# Patient Record
Sex: Female | Born: 1940 | Race: White | Hispanic: No | Smoking: Former smoker
Health system: Southern US, Community
[De-identification: ages and names within clinical notes are randomized; demographics above are authoritative.]

## PROBLEM LIST (undated history)

## (undated) DIAGNOSIS — M1990 Unspecified osteoarthritis, unspecified site: Secondary | ICD-10-CM

## (undated) DIAGNOSIS — C4490 Unspecified malignant neoplasm of skin, unspecified: Secondary | ICD-10-CM

## (undated) DIAGNOSIS — K219 Gastro-esophageal reflux disease without esophagitis: Secondary | ICD-10-CM

## (undated) DIAGNOSIS — T7840XA Allergy, unspecified, initial encounter: Secondary | ICD-10-CM

## (undated) DIAGNOSIS — M810 Age-related osteoporosis without current pathological fracture: Secondary | ICD-10-CM

## (undated) DIAGNOSIS — I10 Essential (primary) hypertension: Secondary | ICD-10-CM

## (undated) DIAGNOSIS — L570 Actinic keratosis: Secondary | ICD-10-CM

## (undated) DIAGNOSIS — E785 Hyperlipidemia, unspecified: Secondary | ICD-10-CM

## (undated) DIAGNOSIS — Z8619 Personal history of other infectious and parasitic diseases: Secondary | ICD-10-CM

## (undated) DIAGNOSIS — C50919 Malignant neoplasm of unspecified site of unspecified female breast: Secondary | ICD-10-CM

## (undated) DIAGNOSIS — Z923 Personal history of irradiation: Secondary | ICD-10-CM

## (undated) DIAGNOSIS — M8580 Other specified disorders of bone density and structure, unspecified site: Secondary | ICD-10-CM

## (undated) DIAGNOSIS — L439 Lichen planus, unspecified: Secondary | ICD-10-CM

## (undated) HISTORY — DX: Other specified disorders of bone density and structure, unspecified site: M85.80

## (undated) HISTORY — PX: BREAST CYST ASPIRATION: SHX578

## (undated) HISTORY — DX: Lichen planus, unspecified: L43.9

## (undated) HISTORY — DX: Unspecified osteoarthritis, unspecified site: M19.90

## (undated) HISTORY — PX: OOPHORECTOMY: SHX86

## (undated) HISTORY — DX: Age-related osteoporosis without current pathological fracture: M81.0

## (undated) HISTORY — DX: Actinic keratosis: L57.0

## (undated) HISTORY — DX: Malignant neoplasm of unspecified site of unspecified female breast: C50.919

## (undated) HISTORY — DX: Essential (primary) hypertension: I10

## (undated) HISTORY — DX: Allergy, unspecified, initial encounter: T78.40XA

## (undated) HISTORY — DX: Personal history of other infectious and parasitic diseases: Z86.19

## (undated) HISTORY — DX: Unspecified malignant neoplasm of skin, unspecified: C44.90

## (undated) HISTORY — DX: Hyperlipidemia, unspecified: E78.5

## (undated) HISTORY — DX: Gastro-esophageal reflux disease without esophagitis: K21.9

## (undated) HISTORY — PX: OTHER SURGICAL HISTORY: SHX169

## (undated) HISTORY — PX: TUBAL LIGATION: SHX77

---

## 1966-11-14 HISTORY — PX: BREAST SURGERY: SHX581

## 1966-11-14 HISTORY — PX: BREAST EXCISIONAL BIOPSY: SUR124

## 2000-11-14 HISTORY — PX: OTHER SURGICAL HISTORY: SHX169

## 2002-02-07 HISTORY — PX: CARDIOVASCULAR STRESS TEST: SHX262

## 2002-04-22 HISTORY — PX: COLONOSCOPY: SHX174

## 2003-11-15 HISTORY — PX: CHOLECYSTECTOMY: SHX55

## 2004-01-12 HISTORY — PX: OTHER SURGICAL HISTORY: SHX169

## 2006-08-23 ENCOUNTER — Ambulatory Visit: Payer: Self-pay | Admitting: Internal Medicine

## 2007-05-23 ENCOUNTER — Emergency Department: Payer: Self-pay | Admitting: Emergency Medicine

## 2007-05-23 ENCOUNTER — Other Ambulatory Visit: Payer: Self-pay

## 2007-09-28 ENCOUNTER — Ambulatory Visit: Payer: Self-pay | Admitting: Obstetrics and Gynecology

## 2008-07-23 LAB — CONVERTED CEMR LAB
AST: 24 units/L
Albumin: 4.1 g/dL
Alkaline Phosphatase: 80 units/L
CO2: 32.1 meq/L
Chloride: 105 meq/L
Cholesterol: 189 mg/dL
Total CHOL/HDL Ratio: 4.1
Total Protein: 6.8 g/dL
VLDL: 22 mg/dL

## 2008-09-24 ENCOUNTER — Encounter: Payer: Self-pay | Admitting: Family Medicine

## 2008-10-16 ENCOUNTER — Ambulatory Visit: Payer: Self-pay | Admitting: Obstetrics and Gynecology

## 2008-12-05 ENCOUNTER — Encounter: Payer: Self-pay | Admitting: Family Medicine

## 2009-01-01 ENCOUNTER — Encounter: Payer: Self-pay | Admitting: Family Medicine

## 2009-02-19 ENCOUNTER — Encounter: Payer: Self-pay | Admitting: Family Medicine

## 2009-03-30 ENCOUNTER — Encounter: Payer: Self-pay | Admitting: Family Medicine

## 2009-03-30 HISTORY — PX: OTHER SURGICAL HISTORY: SHX169

## 2009-04-27 ENCOUNTER — Encounter: Payer: Self-pay | Admitting: Family Medicine

## 2009-07-28 LAB — CONVERTED CEMR LAB
ALT: 20 units/L
Albumin: 4.1 g/dL
Bilirubin, Direct: 0.1 mg/dL
CO2: 31.7 meq/L
Chloride: 104 meq/L
Cholesterol: 183 mg/dL
Glucose, Bld: 91 mg/dL
Potassium: 4.2 meq/L
Total Protein: 6.7 g/dL
VLDL: 16 mg/dL

## 2009-09-14 LAB — CONVERTED CEMR LAB

## 2009-10-20 ENCOUNTER — Ambulatory Visit: Payer: Self-pay | Admitting: Obstetrics and Gynecology

## 2009-10-20 LAB — HM MAMMOGRAPHY: HM Mammogram: NORMAL

## 2009-10-22 ENCOUNTER — Encounter: Payer: Self-pay | Admitting: Family Medicine

## 2009-11-14 DIAGNOSIS — M1990 Unspecified osteoarthritis, unspecified site: Secondary | ICD-10-CM

## 2009-11-14 HISTORY — DX: Unspecified osteoarthritis, unspecified site: M19.90

## 2010-01-12 HISTORY — PX: OTHER SURGICAL HISTORY: SHX169

## 2010-01-14 LAB — CONVERTED CEMR LAB
ALT: 15 units/L
Albumin: 3.8 g/dL
Anion Gap: 8.9
BUN: 18 mg/dL
Bilirubin, Direct: 0.2 mg/dL
CO2: 30.6 meq/L
Calcium: 9.7 mg/dL
Chloride: 105 meq/L
Cholesterol: 187 mg/dL
Glucose, Bld: 98 mg/dL
Potassium: 4 meq/L
Total Protein: 6.5 g/dL
Triglycerides: 102 mg/dL

## 2010-03-29 DIAGNOSIS — C4492 Squamous cell carcinoma of skin, unspecified: Secondary | ICD-10-CM

## 2010-03-29 HISTORY — DX: Squamous cell carcinoma of skin, unspecified: C44.92

## 2010-04-14 HISTORY — PX: OTHER SURGICAL HISTORY: SHX169

## 2010-04-20 HISTORY — PX: OTHER SURGICAL HISTORY: SHX169

## 2010-05-04 DIAGNOSIS — C4491 Basal cell carcinoma of skin, unspecified: Secondary | ICD-10-CM

## 2010-05-04 HISTORY — DX: Basal cell carcinoma of skin, unspecified: C44.91

## 2010-07-07 ENCOUNTER — Ambulatory Visit: Payer: Self-pay | Admitting: Family Medicine

## 2010-07-07 DIAGNOSIS — I10 Essential (primary) hypertension: Secondary | ICD-10-CM | POA: Insufficient documentation

## 2010-07-07 DIAGNOSIS — M1990 Unspecified osteoarthritis, unspecified site: Secondary | ICD-10-CM | POA: Insufficient documentation

## 2010-07-07 DIAGNOSIS — Z85828 Personal history of other malignant neoplasm of skin: Secondary | ICD-10-CM

## 2010-07-07 DIAGNOSIS — E785 Hyperlipidemia, unspecified: Secondary | ICD-10-CM | POA: Insufficient documentation

## 2010-07-07 DIAGNOSIS — Z9189 Other specified personal risk factors, not elsewhere classified: Secondary | ICD-10-CM | POA: Insufficient documentation

## 2010-08-02 ENCOUNTER — Encounter (INDEPENDENT_AMBULATORY_CARE_PROVIDER_SITE_OTHER): Payer: Self-pay | Admitting: *Deleted

## 2010-08-06 ENCOUNTER — Ambulatory Visit: Payer: Self-pay | Admitting: Family Medicine

## 2010-08-09 ENCOUNTER — Ambulatory Visit: Payer: Self-pay | Admitting: Family Medicine

## 2010-08-09 DIAGNOSIS — R059 Cough, unspecified: Secondary | ICD-10-CM | POA: Insufficient documentation

## 2010-08-09 DIAGNOSIS — R05 Cough: Secondary | ICD-10-CM

## 2010-08-09 LAB — CONVERTED CEMR LAB
Albumin: 4 g/dL (ref 3.5–5.2)
BUN: 18 mg/dL (ref 6–23)
CO2: 29 meq/L (ref 19–32)
Calcium: 9.8 mg/dL (ref 8.4–10.5)
Creatinine, Ser: 0.7 mg/dL (ref 0.4–1.2)
GFR calc non Af Amer: 90.18 mL/min (ref >60)
Glucose, Bld: 91 mg/dL (ref 70–99)
HDL: 43.1 mg/dL (ref >39.00)
Total Protein: 6.5 g/dL (ref 6.0–8.3)
Triglycerides: 132 mg/dL (ref 0.0–149.0)
VLDL: 26.4 mg/dL (ref 0.0–40.0)

## 2010-08-19 ENCOUNTER — Telehealth: Payer: Self-pay | Admitting: Family Medicine

## 2010-11-10 ENCOUNTER — Ambulatory Visit
Admission: RE | Admit: 2010-11-10 | Discharge: 2010-11-10 | Payer: Self-pay | Source: Home / Self Care | Attending: Family Medicine | Admitting: Family Medicine

## 2010-11-10 DIAGNOSIS — J069 Acute upper respiratory infection, unspecified: Secondary | ICD-10-CM | POA: Insufficient documentation

## 2010-11-10 LAB — CONVERTED CEMR LAB: Rapid Strep: NEGATIVE

## 2010-11-11 ENCOUNTER — Ambulatory Visit
Admission: RE | Admit: 2010-11-11 | Discharge: 2010-11-11 | Payer: Self-pay | Source: Home / Self Care | Attending: Family Medicine | Admitting: Family Medicine

## 2010-11-11 ENCOUNTER — Telehealth: Payer: Self-pay | Admitting: Family Medicine

## 2010-11-11 DIAGNOSIS — H1030 Unspecified acute conjunctivitis, unspecified eye: Secondary | ICD-10-CM | POA: Insufficient documentation

## 2010-12-08 ENCOUNTER — Ambulatory Visit: Payer: Self-pay | Admitting: Obstetrics and Gynecology

## 2010-12-14 NOTE — Letter (Signed)
Summary: [facility]  [facility]   Imported By: Lanelle Bal 08/06/2010 09:55:10  _____________________________________________________________________  External Attachment:    Type:   Image     Comment:   External Document

## 2010-12-14 NOTE — Assessment & Plan Note (Signed)
Summary: COUGH X1 MONTH /[facility]   Vital Signs:  Patient profile:   70 year old female Height:      64 inches Weight:      184.25 pounds BMI:     31.74 Temp:     98.2 degrees F oral Pulse rate:   88 / minute Pulse rhythm:   regular BP sitting:   136 / 66  (left arm) Cuff size:   regular  Vitals Entered By: Delilah Shan CMA Ohn Bostic Dull) (August 09, 2010 2:03 PM) CC: Cough x 1 month   History of Present Illness: 1 month of episodic cough w/o wheeze and no sputum production.  No FCNAV.  No cough at night.  Dry cough.  Oc sour taste in mouth.  "metallic taste in mouth."  NO change in smells.  No h/o allergy trouble.  Moved to [place] in 2007.  Sometimes worse after eating.  Voice is "irriated" in AM.  Sleeping on 1 pillow.  Some help with mucinex DM but this was temporary.  On PPI once daily.   H/o GERD and abdominal pain, much improved on PPI.    Allergies: 1)  ! Codeine  Past History:  Past Medical History: CHICKENPOX, HX OF (ICD-V15.9) HYPERTENSION (ICD-401.9) HYPERLIPIDEMIA (ICD-272.4) SKIN CANCER, HX OF (ICD-V10.83) ESSENTIAL HYPERTENSION, BENIGN (ICD-401.1) OSTEOARTHRITIS (ICD-715.90), R hip injection per Dr. Ethelene Hal 2011 GERD 06/23/1998  MRI, neck 02/07/2002  Stress Test 10/31/2003  MRI, neck 01/12/2004   MRI, hip  osteopenia, DXA done 10/2009, consider repeat in 10/2011 or 2013 (T score -1)  Review of Systems       See HPI.  Otherwise negative.    Physical Exam  General:  GEN: nad, alert and oriented HEENT: mucous membranes moist, TM wnl bilaterally, nasal exam w/o abnormality, OP grossly wnl NECK: supple w/o LA CV: rrr.  no murmur PULM: ctab, no inc wob ABD: soft, +bs EXT: no edema SKIN: no acute rash    Impression & Recommendations:  Problem # 1:  COUGH (ICD-786.2) This may be due to GERD with LPR.  Rec: try two times a day PPI and call back in about 10days.  If no relief, consider ENT eval vs treat as presumed nasal source (ie with nasal steroids).  She agrees with  plan.    Complete Medication List: 1)  Naproxen 500 Mg Tabs (Naproxen) .... Take 1 tablet by mouth two times a day 2)  Triamterene-hctz 37.5-25 Mg Tabs (Triamterene-hctz) .... Take 1 tablet by mouth every morning 3)  Loratadine 10 Mg Tabs (Loratadine) .... Take 1 tablet by mouth every morning 4)  Niaspan 1000 Mg Cr-tabs (Niacin (antihyperlipidemic)) .... Take 1 tab by mouth at bedtime 5)  Lipitor 20 Mg Tabs (Atorvastatin calcium) .... Take 1 tab by mouth at bedtime 6)  Carisoprodol 350 Mg Tabs (Carisoprodol) .... Take 1 tab by mouth at bedtime 7)  Tramadol-acetaminophen 37.5-325 Mg Tabs (Tramadol-acetaminophen) .... Take 1 tab by mouth at bedtime 8)  Trazodone Hcl 150 Mg Tabs (Trazodone hcl) .... Take 1.5 tab by mouth at bedtime 9)  Calcium Carbonate-vitamin D 600-400 Mg-unit Tabs (Calcium carbonate-vitamin d) .... Take 1 tablet by mouth two times a day 10)  Multivitamins Tabs (Multiple vitamin) .... Take 1 tablet by mouth once a day 11)  Aspirin 81 Mg Tabs (Aspirin) .... Take 1 tablet by mouth every morning 12)  Glucosamine-chondroitin Caps (Glucosamine-chondroit-vit c-mn) .... Take 1 tablet by mouth two times a day 13)  Garlic Powd (Garlic) .... Take 1 tablet by mouth every morning  14)  Prilosec 20 Mg Cpdr (Omeprazole) .... Take 1 tab by mouth at bedtime  Other Orders: Zoster (Shingles) Vaccine Live [phone]) Admin 1st Vaccine (57846)  Patient Instructions: 1)  Call me back in about 10 days or 2 weeks.  Let me know how you are doing.  I would take prilosec two times a day in the meantime and consider elevating the head of your bed by 3-4 inches.    Current Allergies (reviewed today): ! CODEINE   Immunizations Administered:  Zostavax # 1:    Vaccine Type: Zostavax    Site: Left arm    Mfr: Merck    Dose: 0.5 ml    Route: [place]    Given by: Delilah Shan CMA (AAMA)    Exp. Date: 06/09/2011    Lot #: [id number]    VIS given: 08/26/05 given August 09, 2010.

## 2010-12-14 NOTE — Assessment & Plan Note (Signed)
Summary: NEW PT TO EST/CLE   Vital Signs:  Patient profile:   70 year old female LMP:     11/14/1994 Height:      64 inches Weight:      183.75 pounds BMI:     31.65 Temp:     98.1 degrees F oral Pulse rate:   88 / minute Pulse rhythm:   regular BP sitting:   132 / 70  (left arm) Cuff size:   regular  Vitals Entered By: Delilah Shan CMA Duncan Dull) (July 19, 2010 2:01 PM) CC: New Patient to Establish LMP (date): 11/14/1994     Enter LMP: 11/14/1994 Last PAP Result Done   History of Present Illness: Hypertension:      Using medication without problems or lightheadedness: yes Chest pain with exertion:no Edema:no Short of breath: Other issues: see below  H/o OA in mult joints, prev injected by Dr. Ethelene Hal.  Working on exercise in pool. Occ flare of pain in R 3rd PIP.  No trauma.  Does well on current meds.   Allergies (verified): 1)  ! Codeine  Past History:  Family History: Last updated: 07-19-10 F dead, had little contact with patient, possible CHF M dead, COPD, smoker GF withh CVA at 67  Social History: Last updated: July 19, 2010 College- [employer] grad From [place] plays cards, likes to sew Exercises 5x/week with water aerobics No tob since 1972, rare alcohol  Retired since 2007 and moved to [place] Married 1968 2 kids in [place]  Past Medical History: CHICKENPOX, HX OF (ICD-V15.9) HYPERTENSION (ICD-401.9) HYPERLIPIDEMIA (ICD-272.4) SKIN CANCER, HX OF (ICD-V10.83) ESSENTIAL HYPERTENSION, BENIGN (ICD-401.1) OSTEOARTHRITIS (ICD-715.90) 06/23/1998  MRI, neck 02/07/2002  Stress Test 10/31/2003  MRI, neck 01/12/2004   MRI, hip   Past Surgical History: 1968 Breast Biopsy, fibroadenoma 1972 and 1975  C-Section 2002  Endometrial ablation and ovary removal 2005  Gall Bladder 03/30/09  Right Hip Injection 01/2010   Removal of excess eyelids and shortening of eyelid tendons 04/2010   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  Otherwise negative.    Physical Exam  General:  GEN: nad, alert and oriented HEENT: mucous membranes moist NECK: supple w/o LA CV: rrr.  no murmur PULM: ctab, no inc wob ABD: soft, +bs EXT: no edema SKIN: no acute rash  hands with chronic OA changes but no active synovitis.    Impression & Recommendations:  Problem # 1:  ESSENTIAL HYPERTENSION, BENIGN (ICD-401.1) No change in meds.  Her updated medication list for this problem includes:    Triamterene-hctz 37.5-25 Mg Tabs (Triamterene-hctz) .Marland Kitchen... Take 1 tablet by mouth every morning  Problem # 2:  OSTEOARTHRITIS (ICD-715.90) Also to use topical voltaren gel as needed.  follow up as needed.  Continue exercise. Requesting records.  Her updated medication list for this problem includes:    Naproxen 500 Mg Tabs (Naproxen) .Marland Kitchen... Take 1 tablet by mouth two times a day    Tramadol-acetaminophen 37.5-325 Mg Tabs (Tramadol-acetaminophen) .Marland Kitchen... Take 1 tab by mouth at bedtime  Aspirin 81 Mg Tabs (Aspirin) .Marland Kitchen... Take 1 tablet by mouth every morning  Complete Medication List: 1)  Naproxen 500 Mg Tabs (Naproxen) .... Take 1 tablet by mouth two times a day 2)  Triamterene-hctz 37.5-25 Mg Tabs (Triamterene-hctz) .... Take 1 tablet by mouth every morning 3)  Loratadine 10 Mg Tabs (Loratadine) .... Take 1 tablet by mouth every morning 4)  Niaspan 1000 Mg Cr-tabs (Niacin (antihyperlipidemic)) .... Take 1 tab by mouth at bedtime 5)  Lipitor 20 Mg Tabs (Atorvastatin calcium) .... Take 1 tab by mouth at bedtime 6)  Carisoprodol 350 Mg Tabs  (Carisoprodol) .... Take 1 tab by mouth at bedtime 7)  Tramadol-acetaminophen 37.5-325 Mg Tabs (Tramadol-acetaminophen) .... Take 1 tab by mouth at bedtime 8)  Trazodone Hcl 150 Mg Tabs (Trazodone hcl) .... Take 1.5 tab by mouth at bedtime 9)  Calcium Carbonate-vitamin D 600-400 Mg-unit Tabs (Calcium carbonate-vitamin d) .... Take 1 tablet by mouth two times a day 10)  Multivitamins Tabs (Multiple vitamin) .... Take 1 tablet by mouth once a day 11)  Aspirin 81 Mg Tabs (Aspirin) .... Take 1 tablet by mouth every morning 12)  Glucosamine-chondroitin Caps (Glucosamine-chondroit-vit c-mn) .... Take 1 tablet by mouth two times a day 13)  Garlic Powd (Garlic) .... Take 1 tablet by mouth every morning 14)  Prilosec 20 Mg Cpdr (Omeprazole) .... Take 1 tab by mouth at bedtime  Other Orders: Tdap => 29yrs IM (19147) Admin 1st Vaccine (82956)  Patient Instructions: 1)  Check with your insurance to see if they will cover the shingles shot.  I would get a flu shot this fall.  2)  Please come back for fasting labs in 07/2010. 3)  cmet/lipid 401.1 4)  Please schedule a follow-up appointment as needed.  I would like to see you back in 01/2011.  Prescriptions: TRAZODONE HCL 150 MG TABS (TRAZODONE HCL) Take 1.5 tab by mouth at bedtime  #135 x 3   Entered and Authorized by:   Crawford Givens MD   Signed by:   Crawford Givens MD on 07/07/2010   Method used:   Print then Give to Patient   RxID:   [id number] TRAMADOL-ACETAMINOPHEN 37.5-325 MG TABS (TRAMADOL-ACETAMINOPHEN) Take 1 tab by mouth at bedtime  #90 x 3   Entered and Authorized by:   Crawford Givens MD   Signed by:   Crawford Givens MD on 07/07/2010   Method used:   Print then Give to Patient   RxID:   [id number] CARISOPRODOL 350 MG TABS (CARISOPRODOL) Take 1 tab by mouth at bedtime  #90 x 3   Entered and Authorized by:   Crawford Givens MD   Signed by:   Crawford Givens MD on 07/07/2010   Method used:   Print then Give to Patient   RxID:    [id number] LIPITOR 20 MG TABS (ATORVASTATIN CALCIUM) Take 1 tab by mouth at bedtime  #90 x 3   Entered and Authorized by:   Crawford Givens MD   Signed by:   Crawford Givens MD on 07/07/2010   Method used:   Print then Give to Patient   RxID:   [id number] NIASPAN 1000 MG CR-TABS (NIACIN (ANTIHYPERLIPIDEMIC)) Take 1 tab by mouth at bedtime  #90 x 3   Entered and Authorized by:   Crawford Givens MD   Signed by:   Crawford Givens MD on 07/07/2010   Method used:   Print then Give to Patient   RxID:   [id number] TRIAMTERENE-HCTZ 37.5-25 MG TABS (TRIAMTERENE-HCTZ) Take  1 tablet by mouth every morning  #90 x 3   Entered and Authorized by:   Crawford Givens MD   Signed by:   Crawford Givens MD on 07/07/2010   Method used:   Print then Give to Patient   RxID:   [id number] NAPROXEN 500 MG TABS (NAPROXEN) Take 1 tablet by mouth two times a day  #180 x 3   Entered and Authorized by:   Crawford Givens MD   Signed by:   Crawford Givens MD on 07/07/2010   Method used:   Print then Give to Patient   RxID:   [id number]   Current Allergies (reviewed today): ! CODEINE  Immunizations Administered:  Tetanus Vaccine:    Vaccine Type: Tdap    Site: left deltoid    Mfr: GlaxoSmithKline    Dose: 0.5 ml    Route: IM    Given by: Delilah Shan CMA (AAMA)    Exp. Date: 09/02/2012    Lot #: [id number]    VIS given: 10/02/07 version given July 07, 2010.     Preventive Care Screening  Last Tetanus Booster:    Date:  07/07/2010    Results:  Tdap  Bone Density:    Date:  11/10/1993    Results:  Done std dev  Mammogram:    Date:  10/14/2009    Results:  Done   Pap Smear:    Date:  09/14/2009    Results:  Done   Colonoscopy:    Date:  11/14/2001    Results:  Done     Appended Document: NEW PT TO EST/CLE    Clinical Lists Changes  Observations: Added new observation of PAST SURG HX: 1968 Breast Biopsy, fibroadenoma 1972 and 1975  C-Section 2002  Endometrial  ablation and ovary removal 2005  Gall Bladder 03/30/09  Right Hip Injection 01/2010   Removal of excess eyelids and shortening of eyelid tendons 04/2010   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    Touch Up of Excess Eyelids colonoscopy 2003, repeat due 2013 (07/08/2010 0:45)       Past History:  Past Surgical History: 1968 Breast Biopsy, fibroadenoma 1972 and 1975  C-Section 2002  Endometrial ablation and ovary removal 2005  Gall Bladder 03/30/09  Right Hip Injection 01/2010   Removal of excess eyelids and shortening of eyelid tendons 04/2010   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    Touch Up of Excess Eyelids colonoscopy 2003, repeat due 2013  Appended Document: NEW PT TO EST/CLE    Clinical Lists Changes  Observations: Added new observation of PAST MED HX: CHICKENPOX, HX OF (ICD-V15.9) HYPERTENSION (ICD-401.9) HYPERLIPIDEMIA (ICD-272.4) SKIN CANCER, HX OF (ICD-V10.83) ESSENTIAL HYPERTENSION, BENIGN (ICD-401.1) OSTEOARTHRITIS (ICD-715.90) 06/23/1998  MRI, neck 02/07/2002  Stress Test 10/31/2003  MRI, neck 01/12/2004   MRI, hip  osteopenia, DXA done 10/2009, consider repeat in 10/2011 or 2013 (T score -1) (07/25/2010 17:21)       Past History:  Past Medical History: CHICKENPOX, HX OF (ICD-V15.9) HYPERTENSION (ICD-401.9) HYPERLIPIDEMIA (ICD-272.4) SKIN CANCER, HX OF (ICD-V10.83) ESSENTIAL HYPERTENSION, BENIGN (ICD-401.1) OSTEOARTHRITIS (ICD-715.90) 06/23/1998  MRI, neck 02/07/2002  Stress Test 10/31/2003  MRI, neck 01/12/2004   MRI, hip  osteopenia, DXA done 10/2009, consider repeat in 10/2011 or 2013 (T score -1)

## 2010-12-14 NOTE — Letter (Signed)
Summary: [facility]  [facility]   Imported By: Lanelle Bal 08/06/2010 09:54:23  _____________________________________________________________________  External Attachment:    Type:   Image     Comment:   External Document  Appended Document: [facility]    Clinical Lists Changes  Observations: Added new observation of PAST MED HX: CHICKENPOX, HX OF (ICD-V15.9) HYPERTENSION (ICD-401.9) HYPERLIPIDEMIA (ICD-272.4) SKIN CANCER, HX OF (ICD-V10.83) ESSENTIAL HYPERTENSION, BENIGN (ICD-401.1) OSTEOARTHRITIS (ICD-715.90), R hip injection per Dr. Ethelene Hal 2011 06/23/1998  MRI, neck 02/07/2002  Stress Test 10/31/2003  MRI, neck 01/12/2004   MRI, hip  osteopenia, DXA done 10/2009, consider repeat in 10/2011 or 2013 (T score -1) (08/07/2010 11:46)       Past History:  Past Medical History: CHICKENPOX, HX OF (ICD-V15.9) HYPERTENSION (ICD-401.9) HYPERLIPIDEMIA (ICD-272.4) SKIN CANCER, HX OF (ICD-V10.83) ESSENTIAL HYPERTENSION, BENIGN (ICD-401.1) OSTEOARTHRITIS (ICD-715.90), R hip injection per Dr. Ethelene Hal 2011 06/23/1998  MRI, neck 02/07/2002  Stress Test 10/31/2003  MRI, neck 01/12/2004   MRI, hip  osteopenia, DXA done 10/2009, consider repeat in 10/2011 or 2013 (T score -1)

## 2010-12-14 NOTE — Progress Notes (Signed)
Summary: prilosec is helping   Phone Note Call from Patient Call back at Home Phone [phone]   Caller: Patient Call For: Crawford Givens MD Summary of Call: Patient called to let you know that she has been taking prilosec two times a day and it has really helped. She only has problems right after she eats every now and again.  Initial call taken by: Melody Comas,  August 19, 2010 11:17 AM  Follow-up for Phone Call        Good.  I would continue this for about 1 month and then try to wean back to 1 pill a day.  If she has return of symptoms at that point, go back to 2 pills a day.  follow up as needed.  Follow-up by: Crawford Givens MD,  August 19, 2010 11:27 AM  Additional Follow-up for Phone Call Additional follow up Details #1::        Patient Advised.  Additional Follow-up by: Delilah Shan CMA Duncan Dull),  August 19, 2010 5:11 PM

## 2010-12-14 NOTE — Miscellaneous (Signed)
  Clinical Lists Changes  Observations: Added new observation of MAMMO DUE: 10/2010 (08/02/2010 14:47) Added new observation of MAMMOGRAM: normal (10/20/2009 14:48)      Preventive Care Screening  Mammogram:    Date:  10/20/2009    Next Due:  10/2010    Results:  normal

## 2010-12-14 NOTE — Assessment & Plan Note (Signed)
Summary: FLU SHOT/[facility]  Nurse Visit   Allergies: 1)  ! Codeine  Immunizations Administered:  Influenza Vaccine # 1:    Vaccine Type: Fluvax 3+    Site: left deltoid    Mfr: GlaxoSmithKline    Dose: 0.5 ml    Route: IM    Given by: Lowella Petties CMA    Exp. Date: 05/14/2011    Lot #: [id number]    VIS given: 06/08/10 version given August 06, 2010.  Flu Vaccine Consent Questions:    Do you have a history of severe allergic reactions to this vaccine? no    Any prior history of allergic reactions to egg and/or gelatin? no    Do you have a sensitivity to the preservative Thimersol? no    Do you have a past history of Guillan-Barre Syndrome? no    Do you currently have an acute febrile illness? no    Have you ever had a severe reaction to latex? no    Vaccine information given and explained to patient? yes    Are you currently pregnant? no  Orders Added: 1)  Flu Vaccine 31yrs + [90658] 2)  Admin 1st Vaccine [09811]

## 2010-12-14 NOTE — Letter (Signed)
Summary: [facility]  [facility]   Imported By: Lanelle Bal 08/06/2010 09:46:10  _____________________________________________________________________  External Attachment:    Type:   Image     Comment:   External Document

## 2010-12-14 NOTE — Letter (Signed)
Summary: [facility]  [facility]   Imported By: Lanelle Bal 08/06/2010 09:56:13  _____________________________________________________________________  External Attachment:    Type:   Image     Comment:   External Document

## 2010-12-14 NOTE — Letter (Signed)
Summary: [facility]  [facility]   Imported By: Lanelle Bal 08/06/2010 09:57:37  _____________________________________________________________________  External Attachment:    Type:   Image     Comment:   External Document

## 2010-12-14 NOTE — Op Note (Signed)
Summary: Right Hip Steroid Injection/[facility]  Right Hip Steroid Injection/[facility]   Imported By: Lanelle Bal 08/06/2010 09:51:31  _____________________________________________________________________  External Attachment:    Type:   Image     Comment:   External Document

## 2010-12-14 NOTE — Letter (Signed)
Summary: Records Dated 03-29-10 thru 06-29-10/[facility]  Records Dated 03-29-10 thru 06-29-10/[facility]   Imported By: Lanelle Bal 07/15/2010 12:42:58  _____________________________________________________________________  External Attachment:    Type:   Image     Comment:   External Document

## 2010-12-16 NOTE — Progress Notes (Signed)
Summary: Pink Eye  Phone Note Outgoing Call   Call placed by: Delilah Shan CMA Duncan Dull),  November 11, 2010 12:30 PM Call placed to: Dr. Para March Summary of Call: This patient is on the schedule today for pink eye.  She was just here yesterday afternoon.  Does she need to return again today? Initial call taken by: Delilah Shan CMA Duncan Dull),  November 11, 2010 12:31 PM  Follow-up for Phone Call        I called the patient.  Thick yellow discharge from both eyes, L > R.  No vision change.  Lids are puffy.  No eye pain.  She wanted to come in this afternoon so we'll see her.  Follow-up by: Crawford Givens MD,  November 11, 2010 1:18 PM

## 2010-12-16 NOTE — Assessment & Plan Note (Signed)
Summary: COLD SYMPTOMS   Vital Signs:  Patient profile:   70 year old female Height:      64 inches Weight:      184 pounds BMI:     31.70 Temp:     98.3 degrees F oral Pulse rate:   88 / minute Pulse rhythm:   regular BP sitting:   126 / 70  (left arm) Cuff size:   regular  Vitals Entered By: Delilah Shan CMA Duncan Dull) (November 10, 2010 2:47 PM) CC: Cold symptoms   History of Present Illness: Husband has been sick.  Sx started Saturday night with cough.  Sunday she did fairly well.  Husband has been at ER with PNA and is doing better.  Now with bilateral throat pain  that radiates up to ears.  Still active and up out of bed.  No fevers known.  Dry cough except for minimal sputum in AM.  Sleeping okay in spite of the cough.  +rhinorrhea.    Allergies: 1)  ! Codeine  Review of Systems       See HPI.  Otherwise negative.    Physical Exam  General:  GEN: nad, alert and oriented HEENT: mucous membranes moist, TM w/o erythema, nasal epithelium injected, OP with cobblestoning, sinuses not tender to palpation bilaterally NECK: supple w/o LA CV: rrr. PULM: ctab, no inc wob   Impression & Recommendations:  Problem # 1:  URI (ICD-465.9) RST neg.   I would give this a few days to resolve.  Lungs clear to auscultation bilaterally and nontoxic.  If not improving with symptoms > 1week, I would start the amoxil. Tessalon and supportive tx in meantime.  She agrees.  follow up as needed.  Her updated medication list for this problem includes:    Naproxen 500 Mg Tabs (Naproxen) .Marland Kitchen... Take 1 tablet by mouth two times a day    Loratadine 10 Mg Tabs (Loratadine) .Marland Kitchen... Take 1 tablet by mouth every morning    Aspirin 81 Mg Tabs (Aspirin) .Marland Kitchen... Take 1 tablet by mouth every morning    Tessalon 200 Mg Caps (Benzonatate) .Marland Kitchen... 1 by mouth three times a day for cough  Orders: Rapid Strep (76283) Prescription Created Electronically [phone])  Complete Medication List: 1)  Naproxen 500 Mg Tabs  (Naproxen) .... Take 1 tablet by mouth two times a day 2)  Triamterene-hctz 37.5-25 Mg Tabs (Triamterene-hctz) .... Take 1 tablet by mouth every morning 3)  Loratadine 10 Mg Tabs (Loratadine) .... Take 1 tablet by mouth every morning 4)  Niaspan 1000 Mg Cr-tabs (Niacin (antihyperlipidemic)) .... Take 1 tab by mouth at bedtime 5)  Lipitor 20 Mg Tabs (Atorvastatin calcium) .... Take 1 tab by mouth at bedtime 6)  Carisoprodol 350 Mg Tabs (Carisoprodol) .... Take 1 tab by mouth at bedtime 7)  Tramadol-acetaminophen 37.5-325 Mg Tabs (Tramadol-acetaminophen) .... Take 1 tab by mouth at bedtime 8)  Trazodone Hcl 150 Mg Tabs (Trazodone hcl) .... Take 1.5 tab by mouth at bedtime 9)  Calcium Carbonate-vitamin D 600-400 Mg-unit Tabs (Calcium carbonate-vitamin d) .... Take 1 tablet by mouth two times a day 10)  Multivitamins Tabs (Multiple vitamin) .... Take 1 tablet by mouth once a day 11)  Aspirin 81 Mg Tabs (Aspirin) .... Take 1 tablet by mouth every morning 12)  Glucosamine-chondroitin Caps (Glucosamine-chondroit-vit c-mn) .... Take 1 tablet by mouth two times a day 13)  Garlic Powd (Garlic) .... Take 1 tablet by mouth every morning 14)  Prilosec 20 Mg Cpdr (Omeprazole) .... Take  1 tab by mouth at bedtime 15)  Tessalon 200 Mg Caps (Benzonatate) .Marland Kitchen.. 1 by mouth three times a day for cough 16)  Amoxicillin 875 Mg Tabs (Amoxicillin) .Marland Kitchen.. 1 by mouth two times a day  Patient Instructions: 1)  Start the antibiotics if you aren't improving by early next week.  I would use the tessalon and salt water gargles in the meantime.  Try to get some rest and drink plenty of fluids.  Take care.  Prescriptions: AMOXICILLIN 875 MG TABS (AMOXICILLIN) 1 by mouth two times a day  #20 x 0   Entered and Authorized by:   Crawford Givens MD   Signed by:   Crawford Givens MD on 11/10/2010   Method used:   Print then Give to Patient   RxID:   [id number] TESSALON 200 MG CAPS (BENZONATATE) 1 by mouth three times a day for  cough  #30 x 1   Entered and Authorized by:   Crawford Givens MD   Signed by:   Crawford Givens MD on 11/10/2010   Method used:   Electronically to        [facility]  [address] [id number] (retail)       [address]       [place], [place]  [address]       Ph: [phone] or [phone]       Fax: [phone]   RxID:   [id number]    Orders Added: 1)  Est. Patient Level III [87564] 2)  Rapid Strep [33295] 3)  Prescription Created Electronically [phone]    Current Allergies (reviewed today): ! CODEINE  Laboratory Results  Date/Time Received: November 10, 2010 3:20 PM   Other Tests  Rapid Strep: negative

## 2010-12-16 NOTE — Assessment & Plan Note (Signed)
Summary: pink eye/alc   Vital Signs:  Patient profile:   70 year old female Height:      64 inches Weight:      184 pounds BMI:     31.70 Temp:     98 degrees F oral Pulse rate:   88 / minute Pulse rhythm:   regular BP sitting:   122 / 60  (left arm) Cuff size:   regular  Vitals Entered By: Delilah Shan CMA Duncan Dull) (November 11, 2010 3:01 PM) CC: ? Pink eye   History of Present Illness: Cough and ST are improved today but now has bilateral red eye with yellow discharge.  No vision change.  Eye isn't painful.  No photophobia.  No known FB.  Husband with conjunctivitis.  Allergies: 1)  ! Codeine  Review of Systems       See HPI.  Otherwise negative.    Physical Exam  General:  GEN: nad, alert and oriented HEENT: mucous membranes moist, TM w/o erythema, nasal epithelium injected, OP with cobblestoning- improved, sinuses not tender to palpation bilaterally NECK: supple w/o LA CV: rrr. PULM: ctab, no inc wob eyes with bilateral purulent discharge. PERRL, EOMI and fundi wnl bilateral.  limbus sparing conjunctival injection bilaterally.     Impression & Recommendations:  Problem # 1:  CONJUNCTIVITIS, ACUTE, BILATERAL (ICD-372.00) I would tx given the purulent discharge and the exposure hx.  She agrees.  I think she likely had a viral process with the cough and congestion that is o/w resolving.  Nontoxic.  contact/glasses precautions given.  follow up as needed.  She agrees.  See instructsions.  Orders: Prescription Created Electronically [phone])  Her updated medication list for this problem includes:    Polytrim 10000-0.1 Unit/ml-% Soln (Polymyxin b-trimethoprim) .Marland Kitchen... 1 drop in bilateral eyes every 3 hours during the day (max 6 drops per eye per day) for 7 days  Complete Medication List: 1)  Naproxen 500 Mg Tabs (Naproxen) .... Take 1 tablet by mouth two times a day 2)  Triamterene-hctz 37.5-25 Mg Tabs (Triamterene-hctz) .... Take 1 tablet by mouth every morning 3)   Loratadine 10 Mg Tabs (Loratadine) .... Take 1 tablet by mouth every morning 4)  Niaspan 1000 Mg Cr-tabs (Niacin (antihyperlipidemic)) .... Take 1 tab by mouth at bedtime 5)  Lipitor 20 Mg Tabs (Atorvastatin calcium) .... Take 1 tab by mouth at bedtime 6)  Carisoprodol 350 Mg Tabs (Carisoprodol) .... Take 1 tab by mouth at bedtime 7)  Tramadol-acetaminophen 37.5-325 Mg Tabs (Tramadol-acetaminophen) .... Take 1 tab by mouth at bedtime 8)  Trazodone Hcl 150 Mg Tabs (Trazodone hcl) .... Take 1.5 tab by mouth at bedtime 9)  Calcium Carbonate-vitamin D 600-400 Mg-unit Tabs (Calcium carbonate-vitamin d) .... Take 1 tablet by mouth two times a day 10)  Multivitamins Tabs (Multiple vitamin) .... Take 1 tablet by mouth once a day 11)  Aspirin 81 Mg Tabs (Aspirin) .... Take 1 tablet by mouth every morning 12)  Glucosamine-chondroitin Caps (Glucosamine-chondroit-vit c-mn) .... Take 1 tablet by mouth two times a day 13)  Garlic Powd (Garlic) .... Take 1 tablet by mouth every morning 14)  Prilosec 20 Mg Cpdr (Omeprazole) .... Take 1 tab by mouth at bedtime 15)  Tessalon 200 Mg Caps (Benzonatate) .Marland Kitchen.. 1 by mouth three times a day for cough 16)  Amoxicillin 875 Mg Tabs (Amoxicillin) .Marland Kitchen.. 1 by mouth two times a day 17)  Polytrim 10000-0.1 Unit/ml-% Soln (Polymyxin b-trimethoprim) .Marland Kitchen.. 1 drop in bilateral eyes every 3 hours during  the day (max 6 drops per eye per day) for 7 days  Patient Instructions: 1)  Start the eye drops today and use them for 1 week.  Don't use your old contacts.  Keep using your glasses until your symptoms have resolved for a few days (and after you finish the drops).  Take care.  Prescriptions: POLYTRIM 10000-0.1 UNIT/ML-% SOLN (POLYMYXIN B-TRIMETHOPRIM) 1 drop in bilateral eyes every 3 hours during the day (max 6 drops per eye per day) for 7 days  #47mL x 0   Entered and Authorized by:   Crawford Givens MD   Signed by:   Crawford Givens MD on 11/11/2010   Method used:   [facility] to         [facility]  [address] (retail)       [address]       [place], [place]  [address]       Ph: [phone] or [phone]       Fax: [phone]   RxID:   [id number]    Orders Added: 1)  Prescription Created Electronically [[id number]] 2)  Est. Patient Level III [03474]    Current Allergies (reviewed today): ! CODEINE

## 2011-01-11 ENCOUNTER — Telehealth: Payer: Self-pay | Admitting: Family Medicine

## 2011-01-11 DIAGNOSIS — G4700 Insomnia, unspecified: Secondary | ICD-10-CM | POA: Insufficient documentation

## 2011-01-17 ENCOUNTER — Telehealth: Payer: Self-pay | Admitting: Family Medicine

## 2011-01-20 NOTE — Progress Notes (Signed)
Summary: trazodone  Phone Note Refill Request Message from:  Fax from Pharmacy on January 11, 2011 8:56 AM  Refills Requested: Medication #1:  TRAZODONE HCL 150 MG TABS Take 1.5 tab by mouth at bedtime Re[facility] from [facility]. Form to be filled out is on your desk.   Initial call taken by: Melody Comas,  January 11, 2011 8:57 AM  Follow-up for Phone Call        form signed, please send in.  thanks.  Crawford Givens MD  January 11, 2011 2:53 PM   Faxed.  Delilah Shan CMA (AAMA)  January 11, 2011 3:44 PM   New Problems: INSOMNIA (ICD-780.52)   New Problems: INSOMNIA (ICD-780.52)

## 2011-01-25 ENCOUNTER — Ambulatory Visit (INDEPENDENT_AMBULATORY_CARE_PROVIDER_SITE_OTHER): Payer: Medicare Other | Admitting: Family Medicine

## 2011-01-25 ENCOUNTER — Encounter: Payer: Self-pay | Admitting: Family Medicine

## 2011-01-25 DIAGNOSIS — Z23 Encounter for immunization: Secondary | ICD-10-CM

## 2011-01-25 DIAGNOSIS — E785 Hyperlipidemia, unspecified: Secondary | ICD-10-CM

## 2011-01-25 DIAGNOSIS — R05 Cough: Secondary | ICD-10-CM

## 2011-01-25 DIAGNOSIS — I10 Essential (primary) hypertension: Secondary | ICD-10-CM

## 2011-01-25 NOTE — Progress Notes (Signed)
Summary: ? drug allergy  Phone Note From Pharmacy   Caller: [facility] Summary of Call: Form from [facility] is on your desk, regarding possible drug allergy.                 Lowella Petties CMA, AAMA  January 17, 2011 10:21 AM   Follow-up for Phone Call        please fax back.  done. Crawford Givens MD  January 17, 2011 10:36 AM   Faxed.  Lugene Fuquay CMA (AAMA)  January 17, 2011 11:15 AM

## 2011-01-25 NOTE — Progress Notes (Signed)
Summary: carisoprodol  Phone Note Refill Request Message from:  Fax from Pharmacy on January 17, 2011 10:32 AM  Refills Requested: Medication #1:  CARISOPRODOL 350 MG TABS Take 1 tab by mouth at bedtime Received Form from [facility]  to be filled out and signed, it  is on your desk.   Initial call taken by: Melody Comas,  January 17, 2011 10:34 AM  Follow-up for Phone Call        done, please send in.  thanks. Crawford Givens MD  January 17, 2011 10:37 AM   Faxed.  Lugene Fuquay CMA (AAMA)  January 17, 2011 11:16 AM

## 2011-02-01 NOTE — Assessment & Plan Note (Signed)
Summary: MARCH FOLLOW UP/[facility]   Vital Signs:  Patient profile:   70 year old female Height:      64 inches Weight:      176.50 pounds BMI:     30.41 Temp:     98.3 degrees F oral Pulse rate:   88 / minute Pulse rhythm:   regular BP sitting:   124 / 74  (left arm) Cuff size:   regular  Vitals Entered By: Delilah Shan CMA Sequoyah Counterman Dull) (January 25, 2011 9:53 AM) CC: March follow up   History of Present Illness: Elevated Cholesterol: Using medications without problems:yes Muscle aches: no Other complaints: no Still in water aerobics.   Needs refill on tramadol.  She was taking 2 by mouth at bedtime and the rx was for 1 by mouth at bedtime.  It was sent in as 1 by mouth at bedtime and this needs to be changed.    Hypertension:      Using medication without problems or lightheadedness: yes Chest pain with exertion:no Edema:no Short of breath:no Average home BPs:not checked  Cough- resolved with at bedtime prilosec and doing well.  Tapered down from 40mg  to 20mg .    Allergies: 1)  ! Codeine  Past History:  Past Medical History: Last updated: 08/09/2010 CHICKENPOX, HX OF (ICD-V15.9) HYPERTENSION (ICD-401.9) HYPERLIPIDEMIA (ICD-272.4) SKIN CANCER, HX OF (ICD-V10.83) ESSENTIAL HYPERTENSION, BENIGN (ICD-401.1) OSTEOARTHRITIS (ICD-715.90), R hip injection per Dr. 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  Otherwise negative.    Physical Exam  General:  GEN: nad, alert and oriented HEENT: mucous membranes moist NECK: supple w/o LA CV: regular rate and rhythm  PULM: ctab, no inc wob ABD: soft, +bs EXT: no edema SKIN: no acute rash     Impression & Recommendations:  Problem # 1:  HYPERTENSION (ICD-401.9) NO change in meds.  controlled.  Her updated medication list for this problem includes:    Triamterene-hctz 37.5-25 Mg Tabs (Triamterene-hctz) .Marland Kitchen... Take 1 tablet by mouth every morning  Problem # 2:  HYPERLIPIDEMIA (ICD-272.4) tolerating meds. . no change in meds.  prev labs reviewed.  see plan.  Her updated medication list for this problem includes:    Niaspan 1000 Mg Cr-tabs (Niacin (antihyperlipidemic)) .Marland Kitchen... Take 1 tab by mouth at bedtime    Lipitor 20 Mg Tabs (Atorvastatin calcium) .Marland Kitchen... Take 1 tab by mouth at bedtime  Problem # 3:  COUGH (ICD-786.2) Improved.  She'll try to taper off medicine.   Complete Medication List: 1)  Naproxen 500 Mg Tabs (Naproxen) .... Take 1 tablet by mouth two times a day 2)  Triamterene-hctz 37.5-25 Mg Tabs (Triamterene-hctz) .... Take 1 tablet by mouth every morning 3)  Loratadine 10 Mg Tabs (Loratadine) .... Take 1 tablet by mouth every morning 4)  Niaspan 1000 Mg Cr-tabs (Niacin (antihyperlipidemic)) .... Take 1 tab by mouth at bedtime 5)  Lipitor  20 Mg Tabs (Atorvastatin calcium) .... Take 1 tab by mouth at bedtime 6)  Carisoprodol 350 Mg Tabs (Carisoprodol) .... Take 1 tab by mouth at bedtime 7)  Tramadol-acetaminophen 37.5-325 Mg Tabs (Tramadol-acetaminophen) .... Take 2 tabs by mouth at bedtime 8)  Trazodone Hcl 150 Mg Tabs (Trazodone hcl) .... Take 1.5 tab by mouth at bedtime 9)  Calcium Carbonate-vitamin D 600-400 Mg-unit Tabs (Calcium carbonate-vitamin d) .... Take 1 tablet by mouth two times a day 10)  Multivitamins Tabs (Multiple vitamin) .... Take 1 tablet by mouth once a day 11)  Aspirin 81 Mg Tabs (Aspirin) .... Take 1  tablet by mouth every morning 12)  Glucosamine-chondroitin Caps (Glucosamine-chondroit-vit c-mn) .... Take 1 tablet by mouth two times a day 13)  Garlic Powd (Garlic) .... Take 1 tablet by mouth every morning 14)  Prilosec 20 Mg Cpdr (Omeprazole) .... Take 1 tab by mouth at bedtime 15)  Tessalon 200 Mg Caps (Benzonatate) .Marland Kitchen.. 1 by mouth three times a day for cough 16)  Amoxicillin 875 Mg Tabs (Amoxicillin) .Marland Kitchen.. 1 by mouth two times a day 17)  Polytrim 10000-0.1 Unit/ml-% Soln (Polymyxin b-trimethoprim) .Marland Kitchen.. 1 drop in bilateral eyes every 3 hours during the day (max 6 drops per eye per day) for 7 days 18)  Fish Oil Oil (Fish oil) .... 1,000 mg. every morning  Other Orders: Pneumococcal Vaccine (16109) Admin 1st Vaccine (60454)  Patient Instructions: 1)  Take care.  We'll call about the tramadol.  Let us know if you don't get the back order.  2)  Recheck cmet/lipid before physical in 6 months.   3)  Take care.  Glad to see you.    Orders Added: 1)  Est. Patient Level IV [09811] 2)  Pneumococcal Vaccine [90732] 3)  Admin 1st Vaccine [91478]   Immunizations Administered:  Pneumonia Vaccine:    Vaccine Type: Pneumovax (Medicare)    Site: left deltoid    Mfr: Merck    Dose: 0.5 ml    Route: IM    Given by: Delilah Shan CMA (AAMA)    Exp. Date: 04/07/2012    Lot #: [id number]    VIS given: 10/19/09 version given January 25, 2011.   Immunizations Administered:  Pneumonia Vaccine:    Vaccine Type: Pneumovax (Medicare)    Site: left deltoid    Mfr: Merck    Dose: 0.5 ml    Route: IM    Given by: Delilah Shan CMA (AAMA)    Exp. Date: 04/07/2012    Lot #: [id number]    VIS given: 10/19/09 version given January 25, 2011.  Current Allergies (reviewed today): ! CODEINE  Appended Document: MARCH FOLLOW UP/[facility] Pt's tramadol/APAP rx was corrected via phone by staff Laurette Schimke).  Pt aware.      Clinical Lists Changes  Observations: Added new observation of CHIEF CMPLNT: Preventive Care  (01/26/2011 13:56) Added new observation of PAST MED HX: CHICKENPOX, HX OF (ICD-V15.9) HYPERTENSION (ICD-401.9) HYPERLIPIDEMIA (ICD-272.4) SKIN CANCER, HX OF (ICD-V10.83) ESSENTIAL HYPERTENSION, BENIGN (ICD-401.1) OSTEOARTHRITIS (ICD-715.90), R hip injection per Dr. Ethelene Hal 2011 GERD 06/23/1998  MRI, neck 02/07/2002  Stress Test 10/31/2003  MRI, neck 01/12/2004   MRI, hip  osteopenia, DXA done 10/2009, consider repeat in 10/2011 or 2013 (T score -1)- per [place] Bibles Logan Bores) Pap per gyn Logan Bores) (01/26/2011 13:56)       Past History:  Past Medical History: CHICKENPOX, HX OF (ICD-V15.9) HYPERTENSION (ICD-401.9) HYPERLIPIDEMIA (ICD-272.4) SKIN CANCER, HX OF (ICD-V10.83) ESSENTIAL HYPERTENSION, BENIGN (ICD-401.1) OSTEOARTHRITIS (ICD-715.90), R hip injection per  Dr. Ethelene Hal 2011 GERD 06/23/1998  MRI, neck 02/07/2002  Stress Test 10/31/2003  MRI, neck 01/12/2004   MRI, hip  osteopenia, DXA done 10/2009, consider repeat in 10/2011 or 2013 (T score -1)- per [place] Bibles Logan Bores) Pap per gyn Logan Bores)   Complete Medication List: 1)  Naproxen 500 Mg Tabs (Naproxen) .... Take 1 tablet by mouth two times a day 2)  Triamterene-hctz 37.5-25 Mg Tabs (Triamterene-hctz) .... Take 1 tablet by mouth every morning 3)  Loratadine 10 Mg Tabs (Loratadine) .... Take 1 tablet by mouth every morning 4)  Niaspan 1000 Mg Cr-tabs (Niacin (antihyperlipidemic)) .... Take 1 tab by mouth at bedtime 5)  Lipitor 20 Mg Tabs (Atorvastatin calcium) .... Take 1 tab by mouth at bedtime 6)  Carisoprodol 350 Mg Tabs (Carisoprodol) .... Take 1 tab by mouth at bedtime 7)  Tramadol-acetaminophen 37.5-325 Mg Tabs (Tramadol-acetaminophen) .... Take 2 tabs by mouth at bedtime 8)  Trazodone Hcl 150 Mg Tabs (Trazodone hcl) .... Take 1.5 tab by mouth at bedtime 9)  Calcium Carbonate-vitamin D 600-400 Mg-unit Tabs (Calcium carbonate-vitamin d) .... Take 1 tablet by mouth two times a day 10)  Multivitamins Tabs (Multiple vitamin) .... Take 1 tablet  by mouth once a day 11)  Aspirin 81 Mg Tabs (Aspirin) .... Take 1 tablet by mouth every morning 12)  Glucosamine-chondroitin Caps (Glucosamine-chondroit-vit c-mn) .... Take 1 tablet by mouth two times a day 13)  Garlic Powd (Garlic) .... Take 1 tablet by mouth every morning 14)  Prilosec 20 Mg Cpdr (Omeprazole) .... Take 1 tab by mouth at bedtime 15)  Tessalon 200 Mg Caps (Benzonatate) .Marland Kitchen.. 1 by mouth three times a day for cough 16)  Amoxicillin 875 Mg Tabs (Amoxicillin) .Marland Kitchen.. 1 by mouth two times a day 17)  Polytrim 10000-0.1 Unit/ml-% Soln (Polymyxin b-trimethoprim) .Marland Kitchen.. 1 drop in bilateral eyes every 3 hours during the day (max 6 drops per eye per day) for 7 days 18)  Fish Oil Oil (Fish oil) .... 1,000 mg. every morning  PAP Screening:    Last PAP smear:  09/14/2009  Mammogram Screening:    Last Mammogram:  10/20/2009  Mammogram Results:    Date of Exam:  11/14/2010    Results:  Normal Left, Normal Right  Mammogram Comments:    per patient- normal mammogram in 11/2010 per gyn  Osteoporosis Risk Assessment:  Risk Factors for Fracture or Low Bone Density:   Race (White or Asian):     yes  Immunization & Chemoprophylaxis:    Tetanus vaccine: Tdap  (07/07/2010)    Influenza vaccine: Fluvax 3+  (08/06/2010)    Pneumovax: Pneumovax (Medicare)  (01/25/2011)

## 2011-07-28 ENCOUNTER — Other Ambulatory Visit: Payer: Federal, State, Local not specified - PPO

## 2011-08-02 ENCOUNTER — Other Ambulatory Visit (INDEPENDENT_AMBULATORY_CARE_PROVIDER_SITE_OTHER): Payer: Medicare Other

## 2011-08-02 ENCOUNTER — Encounter: Payer: Federal, State, Local not specified - PPO | Admitting: Family Medicine

## 2011-08-02 DIAGNOSIS — E7800 Pure hypercholesterolemia, unspecified: Secondary | ICD-10-CM

## 2011-08-02 DIAGNOSIS — I10 Essential (primary) hypertension: Secondary | ICD-10-CM

## 2011-08-02 LAB — COMPREHENSIVE METABOLIC PANEL
AST: 22 U/L (ref 0–37)
Albumin: 4 g/dL (ref 3.5–5.2)
BUN: 19 mg/dL (ref 6–23)
Calcium: 10 mg/dL (ref 8.4–10.5)
Chloride: 105 mEq/L (ref 96–112)
Potassium: 4.2 mEq/L (ref 3.5–5.1)

## 2011-08-02 LAB — LDL CHOLESTEROL, DIRECT: Direct LDL: 133.2 mg/dL

## 2011-08-02 LAB — LIPID PANEL: Triglycerides: 101 mg/dL (ref 0.0–149.0)

## 2011-08-04 ENCOUNTER — Encounter: Payer: Self-pay | Admitting: Family Medicine

## 2011-08-10 ENCOUNTER — Ambulatory Visit (INDEPENDENT_AMBULATORY_CARE_PROVIDER_SITE_OTHER): Payer: Medicare Other | Admitting: Family Medicine

## 2011-08-10 ENCOUNTER — Encounter: Payer: Self-pay | Admitting: Family Medicine

## 2011-08-10 VITALS — BP 138/64 | HR 88 | Temp 97.8°F | Wt 171.5 lb

## 2011-08-10 DIAGNOSIS — G4700 Insomnia, unspecified: Secondary | ICD-10-CM

## 2011-08-10 DIAGNOSIS — Z1211 Encounter for screening for malignant neoplasm of colon: Secondary | ICD-10-CM

## 2011-08-10 DIAGNOSIS — J302 Other seasonal allergic rhinitis: Secondary | ICD-10-CM

## 2011-08-10 DIAGNOSIS — Z7189 Other specified counseling: Secondary | ICD-10-CM

## 2011-08-10 DIAGNOSIS — J309 Allergic rhinitis, unspecified: Secondary | ICD-10-CM

## 2011-08-10 DIAGNOSIS — K219 Gastro-esophageal reflux disease without esophagitis: Secondary | ICD-10-CM

## 2011-08-10 DIAGNOSIS — I10 Essential (primary) hypertension: Secondary | ICD-10-CM

## 2011-08-10 DIAGNOSIS — M8580 Other specified disorders of bone density and structure, unspecified site: Secondary | ICD-10-CM

## 2011-08-10 DIAGNOSIS — E785 Hyperlipidemia, unspecified: Secondary | ICD-10-CM

## 2011-08-10 DIAGNOSIS — M1990 Unspecified osteoarthritis, unspecified site: Secondary | ICD-10-CM

## 2011-08-10 DIAGNOSIS — Z23 Encounter for immunization: Secondary | ICD-10-CM

## 2011-08-10 DIAGNOSIS — M899 Disorder of bone, unspecified: Secondary | ICD-10-CM

## 2011-08-10 MED ORDER — NIACIN ER (ANTIHYPERLIPIDEMIC) 1000 MG PO TBCR: 1000.0000 mg | EXTENDED_RELEASE_TABLET | Freq: Every day | ORAL | Status: DC

## 2011-08-10 MED ORDER — TRIAMTERENE-HCTZ 37.5-25 MG PO CAPS: 1.0000 | ORAL_CAPSULE | ORAL | Status: DC

## 2011-08-10 MED ORDER — TRAMADOL-ACETAMINOPHEN 37.5-325 MG PO TABS: 2.0000 | ORAL_TABLET | Freq: Every day | ORAL | Status: DC

## 2011-08-10 MED ORDER — MOMETASONE FUROATE 0.1 % EX SOLN: Freq: Every day | CUTANEOUS | Status: DC

## 2011-08-10 MED ORDER — TRAZODONE HCL 150 MG PO TABS: 225.0000 mg | ORAL_TABLET | Freq: Every day | ORAL | Status: DC

## 2011-08-10 MED ORDER — CARISOPRODOL 350 MG PO TABS: 350.0000 mg | ORAL_TABLET | Freq: Every day | ORAL | Status: DC

## 2011-08-10 MED ORDER — ATORVASTATIN CALCIUM 20 MG PO TABS: 20.0000 mg | ORAL_TABLET | Freq: Every day | ORAL | Status: DC

## 2011-08-10 MED ORDER — NAPROXEN 500 MG PO TABS: 500.0000 mg | ORAL_TABLET | Freq: Two times a day (BID) | ORAL | Status: DC

## 2011-08-10 NOTE — Patient Instructions (Signed)
Keep exercising and working on your diet.  Take care.  Talk to Dr. Logan Bores about your mammogram and bone scan.  Glad to see you.

## 2011-08-10 NOTE — Progress Notes (Signed)
Itchy ear canals, worse with seasonal allergies.  Had used topical mometasone with relief.  Needs refill.  No ADE.   Heartburn/lower abd pain, needed to go back on PPI daily.  She had tried taking it qod, but this didn't last.  Worked on diet w/o sig change (occ changed with spicy food, but this isn't consistent).  No vomiting, no blood in stool.  No fevers.    Insomnia, variable sx, but some relief with the medicine.  Good sleep hygiene, not using caffeine late in the day.  Has a comfortable bed and dark room.  Uses sleep mask.    OA.  Using meds with relief.  No redness in MCPs per patient.  No recent flares.  Tolerating meds.  Using water exercises.    Hypertension:    Using medication without problems or lightheadedness: yes Chest pain with exertion:no Edema:no Short of breath:no Average home BPs: not checked  Elevated Cholesterol: Using medications without problems: yes Muscle aches: no Diet compliance:yes Exercise:yes Other complaints:no  Dxa and mammogram per gyn.    Meds, vitals, and allergies reviewed.   PMH and SH reviewed  ROS: See HPI.  Otherwise negative.    GEN: nad, alert and oriented HEENT: mucous membranes moist, no active irritation in canals and TMs wnl NECK: supple w/o LA CV: rrr. PULM: ctab, no inc wob ABD: soft, +bs EXT: no edema SKIN: no acute rash

## 2011-08-11 ENCOUNTER — Encounter: Payer: Self-pay | Admitting: Family Medicine

## 2011-08-11 DIAGNOSIS — Z7189 Other specified counseling: Secondary | ICD-10-CM | POA: Insufficient documentation

## 2011-08-11 DIAGNOSIS — K219 Gastro-esophageal reflux disease without esophagitis: Secondary | ICD-10-CM | POA: Insufficient documentation

## 2011-08-11 DIAGNOSIS — M810 Age-related osteoporosis without current pathological fracture: Secondary | ICD-10-CM | POA: Insufficient documentation

## 2011-08-11 DIAGNOSIS — Z1211 Encounter for screening for malignant neoplasm of colon: Secondary | ICD-10-CM | POA: Insufficient documentation

## 2011-08-11 DIAGNOSIS — J302 Other seasonal allergic rhinitis: Secondary | ICD-10-CM | POA: Insufficient documentation

## 2011-08-11 DIAGNOSIS — M8580 Other specified disorders of bone density and structure, unspecified site: Secondary | ICD-10-CM | POA: Insufficient documentation

## 2011-08-11 NOTE — Assessment & Plan Note (Signed)
Not due for repeat colonoscopy yet.

## 2011-08-11 NOTE — Assessment & Plan Note (Signed)
Stable, no change in meds.  Continue exercise, d/w pt about diet.

## 2011-08-11 NOTE — Assessment & Plan Note (Signed)
Sx controlled, no change in meds.  D/w pt about sleep hygiene.

## 2011-08-11 NOTE — Assessment & Plan Note (Signed)
Continue PPI in light of sx with continued NSAID tx.

## 2011-08-11 NOTE — Assessment & Plan Note (Signed)
Sx controlled, no change in meds.

## 2011-08-11 NOTE — Assessment & Plan Note (Signed)
With sensitivity of the ear canal.  Normal exam today, but needs refill, this was done.  F/u prn.

## 2011-08-11 NOTE — Assessment & Plan Note (Signed)
BP controlled, no change in meds.  Continue exercise, d/w pt about diet.

## 2011-08-11 NOTE — Assessment & Plan Note (Signed)
She'll check into her AD preparation.

## 2011-11-15 HISTORY — PX: BREAST BIOPSY: SHX20

## 2011-11-24 DIAGNOSIS — N63 Unspecified lump in unspecified breast: Secondary | ICD-10-CM | POA: Diagnosis not present

## 2011-12-12 ENCOUNTER — Ambulatory Visit: Payer: Self-pay | Admitting: Obstetrics and Gynecology

## 2011-12-12 DIAGNOSIS — N6459 Other signs and symptoms in breast: Secondary | ICD-10-CM | POA: Diagnosis not present

## 2011-12-12 DIAGNOSIS — R928 Other abnormal and inconclusive findings on diagnostic imaging of breast: Secondary | ICD-10-CM | POA: Diagnosis not present

## 2011-12-12 DIAGNOSIS — N63 Unspecified lump in unspecified breast: Secondary | ICD-10-CM | POA: Diagnosis not present

## 2011-12-26 DIAGNOSIS — N63 Unspecified lump in unspecified breast: Secondary | ICD-10-CM | POA: Diagnosis not present

## 2011-12-26 DIAGNOSIS — L570 Actinic keratosis: Secondary | ICD-10-CM | POA: Diagnosis not present

## 2011-12-26 DIAGNOSIS — L723 Sebaceous cyst: Secondary | ICD-10-CM | POA: Diagnosis not present

## 2011-12-26 DIAGNOSIS — L820 Inflamed seborrheic keratosis: Secondary | ICD-10-CM | POA: Diagnosis not present

## 2011-12-26 DIAGNOSIS — Z85828 Personal history of other malignant neoplasm of skin: Secondary | ICD-10-CM | POA: Diagnosis not present

## 2012-01-09 DIAGNOSIS — N63 Unspecified lump in unspecified breast: Secondary | ICD-10-CM | POA: Diagnosis not present

## 2012-01-10 DIAGNOSIS — N6009 Solitary cyst of unspecified breast: Secondary | ICD-10-CM | POA: Diagnosis not present

## 2012-01-16 ENCOUNTER — Encounter: Payer: Self-pay | Admitting: Family Medicine

## 2012-01-16 DIAGNOSIS — N6009 Solitary cyst of unspecified breast: Secondary | ICD-10-CM | POA: Insufficient documentation

## 2012-01-26 ENCOUNTER — Other Ambulatory Visit: Payer: Self-pay | Admitting: *Deleted

## 2012-01-27 ENCOUNTER — Other Ambulatory Visit: Payer: Self-pay | Admitting: *Deleted

## 2012-01-27 MED ORDER — CARISOPRODOL 350 MG PO TABS: 350.0000 mg | ORAL_TABLET | Freq: Every day | ORAL | Status: DC

## 2012-01-27 NOTE — Telephone Encounter (Signed)
Faxed to [facility] [phone]

## 2012-01-27 NOTE — Telephone Encounter (Signed)
Printed, please fax in.  

## 2012-04-02 DIAGNOSIS — L578 Other skin changes due to chronic exposure to nonionizing radiation: Secondary | ICD-10-CM | POA: Diagnosis not present

## 2012-04-02 DIAGNOSIS — L570 Actinic keratosis: Secondary | ICD-10-CM | POA: Diagnosis not present

## 2012-07-12 ENCOUNTER — Ambulatory Visit: Payer: Self-pay | Admitting: General Surgery

## 2012-07-12 DIAGNOSIS — N63 Unspecified lump in unspecified breast: Secondary | ICD-10-CM | POA: Diagnosis not present

## 2012-07-12 DIAGNOSIS — R928 Other abnormal and inconclusive findings on diagnostic imaging of breast: Secondary | ICD-10-CM | POA: Diagnosis not present

## 2012-07-19 DIAGNOSIS — Z1211 Encounter for screening for malignant neoplasm of colon: Secondary | ICD-10-CM | POA: Diagnosis not present

## 2012-07-19 DIAGNOSIS — N6019 Diffuse cystic mastopathy of unspecified breast: Secondary | ICD-10-CM | POA: Diagnosis not present

## 2012-07-24 ENCOUNTER — Other Ambulatory Visit: Payer: Self-pay | Admitting: Family Medicine

## 2012-07-24 DIAGNOSIS — E7800 Pure hypercholesterolemia, unspecified: Secondary | ICD-10-CM

## 2012-07-24 DIAGNOSIS — M8580 Other specified disorders of bone density and structure, unspecified site: Secondary | ICD-10-CM

## 2012-07-31 ENCOUNTER — Other Ambulatory Visit (INDEPENDENT_AMBULATORY_CARE_PROVIDER_SITE_OTHER): Payer: Medicare Other

## 2012-07-31 DIAGNOSIS — E7800 Pure hypercholesterolemia, unspecified: Secondary | ICD-10-CM | POA: Diagnosis not present

## 2012-07-31 DIAGNOSIS — M8580 Other specified disorders of bone density and structure, unspecified site: Secondary | ICD-10-CM

## 2012-07-31 DIAGNOSIS — M949 Disorder of cartilage, unspecified: Secondary | ICD-10-CM | POA: Diagnosis not present

## 2012-07-31 LAB — COMPREHENSIVE METABOLIC PANEL
BUN: 18 mg/dL (ref 6–23)
CO2: 27 mEq/L (ref 19–32)
Creatinine, Ser: 0.9 mg/dL (ref 0.4–1.2)
GFR: 68.6 mL/min (ref >60.00)
Glucose, Bld: 90 mg/dL (ref 70–99)
Sodium: 139 mEq/L (ref 135–145)
Total Bilirubin: 0.7 mg/dL (ref 0.3–1.2)
Total Protein: 6.7 g/dL (ref 6.0–8.3)

## 2012-07-31 LAB — LIPID PANEL
Cholesterol: 174 mg/dL (ref 0–200)
HDL: 43 mg/dL (ref >39.00)
Triglycerides: 169 mg/dL — ABNORMAL HIGH (ref 0.0–149.0)

## 2012-08-01 LAB — VITAMIN D 25 HYDROXY (VIT D DEFICIENCY, FRACTURES): Vit D, 25-Hydroxy: 44 ng/mL (ref 30–89)

## 2012-08-10 ENCOUNTER — Ambulatory Visit (INDEPENDENT_AMBULATORY_CARE_PROVIDER_SITE_OTHER): Payer: Medicare Other | Admitting: Family Medicine

## 2012-08-10 ENCOUNTER — Encounter: Payer: Self-pay | Admitting: Family Medicine

## 2012-08-10 ENCOUNTER — Other Ambulatory Visit: Payer: Self-pay | Admitting: *Deleted

## 2012-08-10 VITALS — BP 122/68 | HR 70 | Temp 98.2°F | Ht 64.0 in | Wt 170.0 lb

## 2012-08-10 DIAGNOSIS — E785 Hyperlipidemia, unspecified: Secondary | ICD-10-CM

## 2012-08-10 DIAGNOSIS — M1990 Unspecified osteoarthritis, unspecified site: Secondary | ICD-10-CM

## 2012-08-10 DIAGNOSIS — N6009 Solitary cyst of unspecified breast: Secondary | ICD-10-CM

## 2012-08-10 DIAGNOSIS — Z0000 Encounter for general adult medical examination without abnormal findings: Secondary | ICD-10-CM

## 2012-08-10 DIAGNOSIS — Z1211 Encounter for screening for malignant neoplasm of colon: Secondary | ICD-10-CM

## 2012-08-10 DIAGNOSIS — M8580 Other specified disorders of bone density and structure, unspecified site: Secondary | ICD-10-CM

## 2012-08-10 DIAGNOSIS — Z23 Encounter for immunization: Secondary | ICD-10-CM | POA: Diagnosis not present

## 2012-08-10 DIAGNOSIS — Z7189 Other specified counseling: Secondary | ICD-10-CM | POA: Diagnosis not present

## 2012-08-10 DIAGNOSIS — G4700 Insomnia, unspecified: Secondary | ICD-10-CM

## 2012-08-10 DIAGNOSIS — I10 Essential (primary) hypertension: Secondary | ICD-10-CM | POA: Diagnosis not present

## 2012-08-10 MED ORDER — TRAZODONE HCL 100 MG PO TABS: ORAL_TABLET | ORAL | Status: DC

## 2012-08-10 MED ORDER — ATORVASTATIN CALCIUM 20 MG PO TABS: 20.0000 mg | ORAL_TABLET | Freq: Every day | ORAL | Status: DC

## 2012-08-10 MED ORDER — NAPROXEN 500 MG PO TABS: 500.0000 mg | ORAL_TABLET | Freq: Two times a day (BID) | ORAL | Status: DC

## 2012-08-10 MED ORDER — TRAMADOL-ACETAMINOPHEN 37.5-325 MG PO TABS: 2.0000 | ORAL_TABLET | Freq: Every day | ORAL | Status: DC

## 2012-08-10 MED ORDER — CARISOPRODOL 350 MG PO TABS: 350.0000 mg | ORAL_TABLET | Freq: Every day | ORAL | Status: DC

## 2012-08-10 MED ORDER — TRIAMTERENE-HCTZ 37.5-25 MG PO CAPS: 1.0000 | ORAL_CAPSULE | ORAL | Status: DC

## 2012-08-10 MED ORDER — NIACIN ER (ANTIHYPERLIPIDEMIC) 1000 MG PO TBCR: 1000.0000 mg | EXTENDED_RELEASE_TABLET | Freq: Every day | ORAL | Status: DC

## 2012-08-10 NOTE — Patient Instructions (Addendum)
Don't change your meds (except for stopping the garlic and fish oil).  Recheck labs in 1 year.  Take care.  Keep exercising.  Glad to see you.

## 2012-08-13 ENCOUNTER — Encounter: Payer: Self-pay | Admitting: Family Medicine

## 2012-08-13 DIAGNOSIS — Z0000 Encounter for general adult medical examination without abnormal findings: Secondary | ICD-10-CM | POA: Insufficient documentation

## 2012-08-13 NOTE — Assessment & Plan Note (Signed)
Controlled with tramadol, no change in meds.

## 2012-08-13 NOTE — Assessment & Plan Note (Signed)
Per surgery clinic. 

## 2012-08-13 NOTE — Progress Notes (Signed)
I have personally reviewed the Medicare Annual Wellness questionnaire and have noted 1. The patient's medical and social history 2. Their use of alcohol, tobacco or illicit drugs 3. Their current medications and supplements 4. The patient's functional ability including ADL's, fall risks, home safety risks and hearing or visual             impairment. 5. Diet and physical activities 6. Evidence for depression or mood disorders  The patients weight, height, BMI have been recorded in the chart and visual acuity is per eye clinic.  I have made referrals, counseling and provided education to the patient based review of the above and I have provided the pt with a written personalized care plan for preventive services.  See scanned forms.  Routine anticipatory guidance given to patient.  See health maintenance. Tetanus 2011 Flu yearly Shingles 2011 PNA 2012 Colonoscopy pending 2013 per Dr. Lemar Livings Breast cancer screening done with f/u imaging done for breast cyst Advance directive d/w pt.  Husband would be designated if she were incapacitated.  Has a living will.   Insomnia.  Controlled with trazodone and soma w/o ADE.  Overall tolerable with current meds.  Her husband has mult medical problems and this is difficult/stressful for her.   Hypertension:    Using medication without problems or lightheadedness: yes Chest pain with exertion:no Edema:no Short of breath:no  Elevated Cholesterol: Using medications without problems:yes Muscle aches: no Diet compliance:yes Exercise:yes  OA.  Controlled with tramadol w/o sedation or ADE.  Able to be functional with ADES with current meds.   PMH and SH reviewed  Meds, vitals, and allergies reviewed.   ROS: See HPI.  Otherwise negative.    GEN: nad, alert and oriented HEENT: mucous membranes moist NECK: supple w/o LA CV: rrr. PULM: ctab, no inc wob ABD: soft, +bs EXT: no edema SKIN: no acute rash

## 2012-08-13 NOTE — Assessment & Plan Note (Signed)
She'll consider f/u with GYN re: DXA.

## 2012-08-13 NOTE — Assessment & Plan Note (Signed)
Per surgery clinic. She has f/u pending.

## 2012-08-13 NOTE — Assessment & Plan Note (Signed)
Controlled with meds, no change in meds.

## 2012-08-13 NOTE — Assessment & Plan Note (Addendum)
See scanned forms.  Routine anticipatory guidance given to patient.  See health maintenance. Tetanus 2011 Flu yearly Shingles 2011 PNA 2012 Colonoscopy pending 2013 per Dr. Lemar Livings Breast cancer screening done with f/u imaging done for breast cyst Advance directive d/w pt.  Husband would be designated if she were incapacitated.  Has a living will.  She'll consider f/u with GYN re: DXA.

## 2012-08-13 NOTE — Assessment & Plan Note (Signed)
Controlled, continue current meds. Labs d/w pt.  

## 2012-08-13 NOTE — Assessment & Plan Note (Signed)
Controlled except for TGs,  continue current meds.  Labs d/w pt.  Will work on diet and exercise.

## 2012-08-15 ENCOUNTER — Ambulatory Visit: Payer: Self-pay | Admitting: General Surgery

## 2012-08-15 DIAGNOSIS — I10 Essential (primary) hypertension: Secondary | ICD-10-CM | POA: Diagnosis not present

## 2012-08-15 DIAGNOSIS — Z87891 Personal history of nicotine dependence: Secondary | ICD-10-CM | POA: Diagnosis not present

## 2012-08-15 DIAGNOSIS — Z1211 Encounter for screening for malignant neoplasm of colon: Secondary | ICD-10-CM | POA: Diagnosis not present

## 2012-08-15 DIAGNOSIS — Z79899 Other long term (current) drug therapy: Secondary | ICD-10-CM | POA: Diagnosis not present

## 2012-08-27 ENCOUNTER — Encounter: Payer: Self-pay | Admitting: Family Medicine

## 2012-09-02 HISTORY — PX: COLONOSCOPY: SHX174

## 2012-12-18 ENCOUNTER — Ambulatory Visit: Payer: Self-pay | Admitting: General Surgery

## 2012-12-18 DIAGNOSIS — N6019 Diffuse cystic mastopathy of unspecified breast: Secondary | ICD-10-CM | POA: Diagnosis not present

## 2012-12-18 DIAGNOSIS — N63 Unspecified lump in unspecified breast: Secondary | ICD-10-CM | POA: Diagnosis not present

## 2012-12-31 DIAGNOSIS — L820 Inflamed seborrheic keratosis: Secondary | ICD-10-CM | POA: Diagnosis not present

## 2012-12-31 DIAGNOSIS — Z85828 Personal history of other malignant neoplasm of skin: Secondary | ICD-10-CM | POA: Diagnosis not present

## 2012-12-31 DIAGNOSIS — L570 Actinic keratosis: Secondary | ICD-10-CM | POA: Diagnosis not present

## 2012-12-31 DIAGNOSIS — L578 Other skin changes due to chronic exposure to nonionizing radiation: Secondary | ICD-10-CM | POA: Diagnosis not present

## 2012-12-31 DIAGNOSIS — L821 Other seborrheic keratosis: Secondary | ICD-10-CM | POA: Diagnosis not present

## 2013-01-02 DIAGNOSIS — Z01419 Encounter for gynecological examination (general) (routine) without abnormal findings: Secondary | ICD-10-CM | POA: Diagnosis not present

## 2013-01-02 DIAGNOSIS — N760 Acute vaginitis: Secondary | ICD-10-CM | POA: Diagnosis not present

## 2013-01-02 DIAGNOSIS — K5900 Constipation, unspecified: Secondary | ICD-10-CM | POA: Diagnosis not present

## 2013-01-02 DIAGNOSIS — M899 Disorder of bone, unspecified: Secondary | ICD-10-CM | POA: Diagnosis not present

## 2013-01-02 DIAGNOSIS — M949 Disorder of cartilage, unspecified: Secondary | ICD-10-CM | POA: Diagnosis not present

## 2013-01-07 DIAGNOSIS — N6019 Diffuse cystic mastopathy of unspecified breast: Secondary | ICD-10-CM | POA: Diagnosis not present

## 2013-01-16 DIAGNOSIS — N760 Acute vaginitis: Secondary | ICD-10-CM | POA: Diagnosis not present

## 2013-01-16 DIAGNOSIS — L280 Lichen simplex chronicus: Secondary | ICD-10-CM | POA: Diagnosis not present

## 2013-01-23 DIAGNOSIS — L439 Lichen planus, unspecified: Secondary | ICD-10-CM | POA: Diagnosis not present

## 2013-01-23 DIAGNOSIS — N760 Acute vaginitis: Secondary | ICD-10-CM | POA: Diagnosis not present

## 2013-01-24 ENCOUNTER — Other Ambulatory Visit: Payer: Self-pay | Admitting: *Deleted

## 2013-01-24 MED ORDER — CARISOPRODOL 350 MG PO TABS: 350.0000 mg | ORAL_TABLET | Freq: Every day | ORAL | Status: DC

## 2013-01-24 NOTE — Telephone Encounter (Signed)
Printed, please fax in after I sign.  Thanks.  

## 2013-01-25 ENCOUNTER — Other Ambulatory Visit: Payer: Self-pay | Admitting: *Deleted

## 2013-01-25 NOTE — Telephone Encounter (Signed)
Rx faxed to [facility].

## 2013-03-19 DIAGNOSIS — L439 Lichen planus, unspecified: Secondary | ICD-10-CM | POA: Diagnosis not present

## 2013-03-20 ENCOUNTER — Encounter: Payer: Self-pay | Admitting: Family Medicine

## 2013-03-20 DIAGNOSIS — L439 Lichen planus, unspecified: Secondary | ICD-10-CM | POA: Insufficient documentation

## 2013-08-19 ENCOUNTER — Other Ambulatory Visit: Payer: Self-pay | Admitting: Family Medicine

## 2013-08-19 NOTE — Telephone Encounter (Signed)
Electronic refill request.  Please advise. 

## 2013-08-20 ENCOUNTER — Other Ambulatory Visit: Payer: Self-pay | Admitting: *Deleted

## 2013-08-20 NOTE — Telephone Encounter (Signed)
Sent!

## 2013-08-20 NOTE — Telephone Encounter (Signed)
OK to refill to [facility]?

## 2013-08-21 MED ORDER — TRAMADOL-ACETAMINOPHEN 37.5-325 MG PO TABS: 2.0000 | ORAL_TABLET | Freq: Every day | ORAL | Status: DC

## 2013-08-21 MED ORDER — CARISOPRODOL 350 MG PO TABS: 350.0000 mg | ORAL_TABLET | Freq: Every day | ORAL | Status: DC

## 2013-08-21 NOTE — Telephone Encounter (Signed)
Soma sent, tramadol printed, that needs to be faxed.

## 2013-08-21 NOTE — Telephone Encounter (Signed)
Spoke with patient and advised rx ready for pick-up and it will be at the front desk.  

## 2013-08-22 ENCOUNTER — Other Ambulatory Visit: Payer: Self-pay | Admitting: *Deleted

## 2013-08-25 ENCOUNTER — Other Ambulatory Visit: Payer: Self-pay | Admitting: Family Medicine

## 2013-08-25 DIAGNOSIS — I10 Essential (primary) hypertension: Secondary | ICD-10-CM

## 2013-08-25 DIAGNOSIS — M8580 Other specified disorders of bone density and structure, unspecified site: Secondary | ICD-10-CM

## 2013-08-26 ENCOUNTER — Other Ambulatory Visit: Payer: Self-pay | Admitting: Family Medicine

## 2013-08-26 DIAGNOSIS — M8580 Other specified disorders of bone density and structure, unspecified site: Secondary | ICD-10-CM

## 2013-09-05 ENCOUNTER — Other Ambulatory Visit (INDEPENDENT_AMBULATORY_CARE_PROVIDER_SITE_OTHER): Payer: Medicare Other

## 2013-09-05 DIAGNOSIS — I10 Essential (primary) hypertension: Secondary | ICD-10-CM | POA: Diagnosis not present

## 2013-09-05 DIAGNOSIS — M8580 Other specified disorders of bone density and structure, unspecified site: Secondary | ICD-10-CM

## 2013-09-05 DIAGNOSIS — M899 Disorder of bone, unspecified: Secondary | ICD-10-CM | POA: Diagnosis not present

## 2013-09-05 LAB — COMPREHENSIVE METABOLIC PANEL
ALT: 16 U/L (ref 0–35)
AST: 22 U/L (ref 0–37)
Albumin: 4 g/dL (ref 3.5–5.2)
BUN: 12 mg/dL (ref 6–23)
CO2: 29 mEq/L (ref 19–32)
Calcium: 9.9 mg/dL (ref 8.4–10.5)
Chloride: 102 mEq/L (ref 96–112)
Creatinine, Ser: 0.8 mg/dL (ref 0.4–1.2)
GFR: 77.57 mL/min (ref >60.00)
Potassium: 3.7 mEq/L (ref 3.5–5.1)
Total Protein: 7.1 g/dL (ref 6.0–8.3)

## 2013-09-05 LAB — LIPID PANEL
Cholesterol: 189 mg/dL (ref 0–200)
HDL: 49.8 mg/dL (ref >39.00)
Total CHOL/HDL Ratio: 4
Triglycerides: 131 mg/dL (ref 0.0–149.0)

## 2013-09-06 LAB — VITAMIN D 25 HYDROXY (VIT D DEFICIENCY, FRACTURES): Vit D, 25-Hydroxy: 47 ng/mL (ref 30–89)

## 2013-09-09 ENCOUNTER — Other Ambulatory Visit: Payer: Medicare Other

## 2013-09-12 ENCOUNTER — Encounter: Payer: Self-pay | Admitting: Family Medicine

## 2013-09-12 ENCOUNTER — Ambulatory Visit (INDEPENDENT_AMBULATORY_CARE_PROVIDER_SITE_OTHER): Payer: Medicare Other | Admitting: Family Medicine

## 2013-09-12 VITALS — BP 128/64 | HR 91 | Temp 98.2°F | Ht 64.0 in | Wt 175.8 lb

## 2013-09-12 DIAGNOSIS — E785 Hyperlipidemia, unspecified: Secondary | ICD-10-CM | POA: Diagnosis not present

## 2013-09-12 DIAGNOSIS — Z0000 Encounter for general adult medical examination without abnormal findings: Secondary | ICD-10-CM

## 2013-09-12 DIAGNOSIS — R6889 Other general symptoms and signs: Secondary | ICD-10-CM

## 2013-09-12 DIAGNOSIS — Z23 Encounter for immunization: Secondary | ICD-10-CM | POA: Diagnosis not present

## 2013-09-12 DIAGNOSIS — R7989 Other specified abnormal findings of blood chemistry: Secondary | ICD-10-CM | POA: Insufficient documentation

## 2013-09-12 DIAGNOSIS — I10 Essential (primary) hypertension: Secondary | ICD-10-CM

## 2013-09-12 DIAGNOSIS — M8580 Other specified disorders of bone density and structure, unspecified site: Secondary | ICD-10-CM

## 2013-09-12 DIAGNOSIS — R899 Unspecified abnormal finding in specimens from other organs, systems and tissues: Secondary | ICD-10-CM

## 2013-09-12 DIAGNOSIS — M1990 Unspecified osteoarthritis, unspecified site: Secondary | ICD-10-CM | POA: Diagnosis not present

## 2013-09-12 LAB — CBC WITH DIFFERENTIAL/PLATELET
Basophils Relative: 0.7 % (ref 0.0–3.0)
Eosinophils Relative: 2.6 % (ref 0.0–5.0)
HCT: 36.7 % (ref 36.0–46.0)
Lymphs Abs: 1.9 10*3/uL (ref 0.7–4.0)
MCV: 72.5 fl — ABNORMAL LOW (ref 78.0–100.0)
Monocytes Absolute: 0.6 10*3/uL (ref 0.1–1.0)
Neutro Abs: 4.6 10*3/uL (ref 1.4–7.7)
Platelets: 227 10*3/uL (ref 150.0–400.0)
RBC: 5.06 Mil/uL (ref 3.87–5.11)
WBC: 7.4 10*3/uL (ref 4.5–10.5)

## 2013-09-12 MED ORDER — DICLOFENAC SODIUM 1 % TD GEL: 2.0000 g | Freq: Four times a day (QID) | TRANSDERMAL | Status: DC

## 2013-09-12 NOTE — Progress Notes (Signed)
I have personally reviewed the Medicare Annual Wellness questionnaire and have noted 1. The patient's medical and social history 2. Their use of alcohol, tobacco or illicit drugs 3. Their current medications and supplements 4. The patient's functional ability including ADL's, fall risks, home safety risks and hearing or visual             impairment. 5. Diet and physical activities 6. Evidence for depression or mood disorders  The patients weight, height, BMI have been recorded in the chart and visual acuity is per eye clinic.  I have made referrals, counseling and provided education to the patient based review of the above and I have provided the pt with a written personalized care plan for preventive services.  See scanned forms.  Routine anticipatory guidance given to patient.  See health maintenance. Flu 2014 Shingles 2011 PNA 2012 Tetanus 2011 Colonoscopy 2013 Breast cancer screening up to date, mammogram 2014 Advance directive encouraged, husband designated if incapacitated.  Cognitive function addressed- see scanned forms- and if abnormal then additional documentation follows.  Consider DXA in 2015, d/w pt.    Hypertension:    Using medication without problems or lightheadedness: yes Chest pain with exertion:no Edema:no Short of breath:no  Elevated Cholesterol: Using medications without problems:yes Muscle aches: no Diet compliance:yes Exercise:yes  Hand pain, at the R 3rd MCP, better with topical voltaren, would like to continue.   [facility] hgb on check at [facility], unable to give blood. No bleeding, no h/o similar. D/w pt today, repeat CBC pending. See notes on labs.   PMH and SH reviewed  Meds, vitals, and allergies reviewed.   ROS: See HPI.  Otherwise negative.    GEN: nad, alert and oriented HEENT: mucous membranes moist NECK: supple w/o LA CV: rrr. PULM: ctab, no inc wob ABD: soft, +bs EXT: no edema SKIN: no acute rash

## 2013-09-12 NOTE — Patient Instructions (Addendum)
Stop the niacin and go to the lab on the way out.  We'll contact you with your lab report. Take care. Glad to see you.

## 2013-09-12 NOTE — Assessment & Plan Note (Signed)
Controlled, continue as is.  Labs d/w pt.  

## 2013-09-12 NOTE — Assessment & Plan Note (Signed)
Continue prn topical voltaren and f/u prn.

## 2013-09-12 NOTE — Assessment & Plan Note (Signed)
See notes on labs. 

## 2013-09-12 NOTE — Assessment & Plan Note (Signed)
Consider DXA 2015

## 2013-09-12 NOTE — Assessment & Plan Note (Signed)
See scanned forms.  Routine anticipatory guidance given to patient.  See health maintenance. Flu 2014 Shingles 2011 PNA 2012 Tetanus 2011 Colonoscopy 2013 Breast cancer screening up to date, mammogram 2014 Advance directive encouraged, husband designated if incapacitated.  Cognitive function addressed- see scanned forms- and if abnormal then additional documentation follows.  Consider DXA in 2015, d/w pt.

## 2013-09-13 ENCOUNTER — Other Ambulatory Visit: Payer: Self-pay | Admitting: Family Medicine

## 2013-09-13 DIAGNOSIS — R7989 Other specified abnormal findings of blood chemistry: Secondary | ICD-10-CM

## 2013-09-18 ENCOUNTER — Other Ambulatory Visit (INDEPENDENT_AMBULATORY_CARE_PROVIDER_SITE_OTHER): Payer: Medicare Other

## 2013-09-18 DIAGNOSIS — R7989 Other specified abnormal findings of blood chemistry: Secondary | ICD-10-CM | POA: Diagnosis not present

## 2013-09-18 LAB — CBC WITH DIFFERENTIAL/PLATELET
Basophils Absolute: 0 10*3/uL (ref 0.0–0.1)
Basophils Relative: 0.7 % (ref 0.0–3.0)
Eosinophils Absolute: 0.2 10*3/uL (ref 0.0–0.7)
HCT: 38.1 % (ref 36.0–46.0)
Hemoglobin: 12.1 g/dL (ref 12.0–15.0)
Lymphs Abs: 1.7 10*3/uL (ref 0.7–4.0)
MCHC: 31.8 g/dL (ref 30.0–36.0)
MCV: 74.3 fl — ABNORMAL LOW (ref 78.0–100.0)
Monocytes Absolute: 0.6 10*3/uL (ref 0.1–1.0)
Neutro Abs: 3.4 10*3/uL (ref 1.4–7.7)
RBC: 5.13 Mil/uL — ABNORMAL HIGH (ref 3.87–5.11)

## 2013-09-20 NOTE — Progress Notes (Deleted)
Patient advised.   She would like to see Dr. Servando Snare in [place] (the same one her husband sees).  She has not seen GI in the past.

## 2013-09-22 ENCOUNTER — Other Ambulatory Visit: Payer: Self-pay | Admitting: Family Medicine

## 2013-09-22 DIAGNOSIS — D649 Anemia, unspecified: Secondary | ICD-10-CM

## 2013-09-24 ENCOUNTER — Encounter: Payer: Self-pay | Admitting: Internal Medicine

## 2013-09-24 DIAGNOSIS — L439 Lichen planus, unspecified: Secondary | ICD-10-CM | POA: Diagnosis not present

## 2013-10-21 ENCOUNTER — Encounter: Payer: Self-pay | Admitting: Internal Medicine

## 2013-10-22 ENCOUNTER — Other Ambulatory Visit (INDEPENDENT_AMBULATORY_CARE_PROVIDER_SITE_OTHER): Payer: Medicare Other

## 2013-10-22 ENCOUNTER — Ambulatory Visit (INDEPENDENT_AMBULATORY_CARE_PROVIDER_SITE_OTHER): Payer: Medicare Other | Admitting: Internal Medicine

## 2013-10-22 ENCOUNTER — Encounter: Payer: Self-pay | Admitting: Internal Medicine

## 2013-10-22 VITALS — BP 120/60 | HR 80 | Ht 64.0 in | Wt 176.8 lb

## 2013-10-22 DIAGNOSIS — D509 Iron deficiency anemia, unspecified: Secondary | ICD-10-CM | POA: Diagnosis not present

## 2013-10-22 DIAGNOSIS — K5900 Constipation, unspecified: Secondary | ICD-10-CM

## 2013-10-22 LAB — CBC
HCT: 39.3 % (ref 36.0–46.0)
Hemoglobin: 12.5 g/dL (ref 12.0–15.0)
MCHC: 31.8 g/dL (ref 30.0–36.0)
MCV: 74.8 fl — ABNORMAL LOW (ref 78.0–100.0)
RDW: 18.2 % — ABNORMAL HIGH (ref 11.5–14.6)
WBC: 6.6 10*3/uL (ref 4.5–10.5)

## 2013-10-22 LAB — IGA: IgA: 208 mg/dL (ref 68–378)

## 2013-10-22 LAB — IBC PANEL: Transferrin: 339.1 mg/dL (ref 212.0–360.0)

## 2013-10-22 MED ORDER — POLYETHYLENE GLYCOL 3350 17 GM/SCOOP PO POWD: 17.0000 g | Freq: Every day | ORAL | Status: DC

## 2013-10-22 NOTE — Patient Instructions (Signed)
Your physician has requested that you go to the basement for  lab work before leaving today.  We have sent the following medications to your pharmacy for you to pick up at your convenience: Start taking an iron supplement daily after constipation has improved.   Start taking Miralax 17 g daily  You have been given stool cards. Instructions are inside the envelope. If you have any questions call us.  Follow up with Dr. Rhea Belton in office in 8-12 weeks

## 2013-10-22 NOTE — Progress Notes (Signed)
Patient ID: Alyssa Nolan, female   DOB: 28-Dec-1940, 72 y.o.   MRN: [id number] HPI: Alyssa Nolan is a 72 year old female past medical history of hypertension, hyperlipidemia, GERD, osteopenia who seen in consultation at the request of Dr. Para March to evaluate iron deficiency anemia. She is here alone today. She reports that she was unaware of her anemia until earlier in the year when she tried to give blood and was told that her hemoglobin was 10.9. She does report she has given blood about once per year, but over the past year it has been approximately every 3 months. She also discussed this with Dr. Para March and he recommended that she see me. She reports overall she feels well. Her only main complaint today is constipation. She reports this is a long-standing problem for her. She describes her bowel movements like small pellets or "little rocks". She reports she started Citrucel which has made her bowel movements more regular diet they still remain hard and like small pebbles. She reports she'll have a normal bowel movement about once a year. No blood in her stool or melena. She did a trial Linzess but this caused "stomach aching, bloating" and she "hated it". Other than the constipation she denies abdominal pain. No nausea or vomiting. Appetite. Stable weight. She rarely has heartburn which is relieved very well.TUMS on an as-needed basis. No fevers or chills. No obvious other bleeding including no epistaxis or blood in her urine.  She is up-to-date with colonoscopy and last had a colonoscopy in October 2013 by Dr. Lemar Livings  in [place]. She reports this was normal  Past Medical History  Diagnosis Date  . History of chickenpox   . Hypertension   . Hyperlipidemia   . Arthritis 2011    R hip injection per Dr. Ethelene Hal  . GERD (gastroesophageal reflux disease)   . Osteopenia     Tscore -1, consider repeat in ~2013, per Dr. Logan Bores with gyn  . 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 Mri, neck  06/23/1998  10/31/2003  . Cardiovascular stress test  02/07/2002  . Mri, hip  01/12/2004  . Breast surgery  1968    Fibroadenoma  . Cesarean section  1972 & 1975  . Endometrial ablation and right ovary removal  2002  . Cholecystectomy  2005  . Right hip injection  03/30/2009  . Removal of excess eyelids and shortening of eyelid tendons  01/2010  . 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 Touch up of excess eyelids  04/20/2010    Current Outpatient Prescriptions  Medication Sig Dispense Refill  . atorvastatin (LIPITOR) 20 MG tablet TAKE 1 TABLET DAILY  90 tablet  3  . Calcium Carbonate-Vitamin D (CALCIUM 600/VITAMIN D) 600-400 MG-UNIT per tablet Take 1 tablet by mouth daily.        . carisoprodol (SOMA) 350 MG tablet Take 1 tablet (350 mg total) by mouth at bedtime.  90 tablet  1  . clobetasol ointment (TEMOVATE) 0.05 % Apply 1 application topically every other day.      . diclofenac sodium (VOLTAREN) 1 % GEL Apply 2 g topically 4 (four) times daily.  100 g  5  . loratadine (CLARITIN) 10 MG tablet Take 10 mg by mouth daily.        . methylcellulose packet Take 1 each by mouth daily.      . Multiple Vitamin (  MULTIVITAMIN) tablet Take 1 tablet by mouth daily.        . naproxen (NAPROSYN) 500 MG tablet TAKE 1 TABLET TWICE A DAY  WITH MEALS  180 tablet  3  . omeprazole (PRILOSEC) 20 MG capsule Take 20 mg by mouth daily.        . traMADol-acetaminophen (ULTRACET) 37.5-325 MG per tablet Take 2 tablets by mouth at bedtime.  180 tablet  1  . traZODone (DESYREL) 100 MG tablet TAKE 1 AND 1/2 TABLETS     (=150MG ) AT BEDTIME  135 tablet  3  . triamterene-hydrochlorothiazide (DYAZIDE) 37.5-25 MG per capsule TAKE 1 CAPSULE EVERY       MORNING  90 capsule  3  . polyethylene glycol powder (GLYCOLAX/MIRALAX) powder Take 17 g by mouth daily.  255 g  3   No current facility-administered medications for this visit.    Allergies   Allergen Reactions  . Codeine     REACTION: Felt like she was going to "pass out"    Family History  Problem Relation Age of Onset  . COPD Mother     Smoker  . Heart disease Father     Possible CHF  . Stroke Maternal Grandfather 33    CVA  . Colon cancer      mat 1/2 sister    History  Substance Use Topics  . Smoking status: Former Smoker    Types: Cigarettes    Quit date: 11/14/1969  . Smokeless tobacco: Never Used  . Alcohol Use: Yes     Comment: Rare    ROS: As per history of present illness, otherwise negative  BP 120/60  Pulse 80  Ht 5\' 4"  (1.626 m)  Wt 176 lb 12.8 oz (80.196 kg)  BMI 30.33 kg/m2 Constitutional: Well-developed and well-nourished. No distress. HEENT: Normocephalic and atraumatic. Oropharynx is clear and moist. No oropharyngeal exudate. Conjunctivae are normal.  No scleral icterus. Neck: Neck supple. Trachea midline. Cardiovascular: Normal rate, regular rhythm and intact distal pulses.  Pulmonary/chest: Effort normal and breath sounds normal. No wheezing, rales or rhonchi. Abdominal: Soft, nontender, nondistended. Bowel sounds active throughout. There are no masses palpable. No hepatosplenomegaly. Extremities: no clubbing, cyanosis, or edema Lymphadenopathy: No cervical adenopathy noted. Neurological: Alert and oriented to person place and time. Skin: Skin is warm and dry. No rashes noted. Psychiatric: Normal mood and affect. Behavior is normal.  RELEVANT LABS AND IMAGING: CBC    Component Value Date/Time   WBC 6.6 10/22/2013 0933   RBC 5.26* 10/22/2013 0933   HGB 12.5 10/22/2013 0933   HCT 39.3 10/22/2013 0933   PLT 204.0 10/22/2013 0933   MCV 74.8* 10/22/2013 0933   MCHC 31.8 10/22/2013 0933   RDW 18.2* 10/22/2013 0933   LYMPHSABS 1.7 09/18/2013 1218   MONOABS 0.6 09/18/2013 1218   EOSABS 0.2 09/18/2013 1218   BASOSABS 0.0 09/18/2013 1218    CMP     Component Value Date/Time   NA 140 09/05/2013 0924   K 3.7 09/05/2013 0924   CL 102  09/05/2013 0924   CO2 29 09/05/2013 0924   GLUCOSE 86 09/05/2013 0924   BUN 12 09/05/2013 0924   CREATININE 0.8 09/05/2013 0924   CALCIUM 9.9 09/05/2013 0924   PROT 7.1 09/05/2013 0924   ALBUMIN 4.0 09/05/2013 0924   AST 22 09/05/2013 0924   ALT 16 09/05/2013 0924   ALKPHOS 66 09/05/2013 0924   BILITOT 0.5 09/05/2013 0924   GFRNONAA 90.18 08/06/2010 0912   Iron/TIBC/Ferritin  Component Value Date/Time   IRON 33* 10/22/2013 0933   FERRITIN 3.1* 10/22/2013 0933   Colonoscopy dated 08/15/2012, [facility] Exam to the cecum, good bowel preparation. The entire examined colon is normal on direct and retroflexion views". Recommendation: Repeat colonoscopy in 10 years for screening purposes  ASSESSMENT/PLAN:  72 year old female past medical history of hypertension, hyperlipidemia, GERD, osteopenia who seen in consultation at the request of Dr. Para March to evaluate iron deficiency anemia.  1.  IDA -- her hemoglobin has improved over the last month though her iron studies remain low. Her ferritin was 3.1 today. We discussed further evaluation of this I deficiency anemia with upper endoscopy and perhaps even capsule endoscopy. After discussion she opted for fecal occult blood testing first. If this is positive my recommendation will be for upper endoscopy, which she understands. I have recommended oral iron replacement with Fusion Plus 1 capsule daily. I would like to see her back in 8 weeks' time. Will also check a celiac panel today.  2.  Constipation -- poor response to Linzess. I have recommended beginning with MiraLax 17 g daily. I would like for her to get this started before starting iron as it can be constipating. She understands this recommendation. She can continue Citrucel or stop it depending on how she responds to MiraLax. If she fails to respond to MiraLax my next option would be lubiprostone.

## 2013-10-29 ENCOUNTER — Encounter: Payer: Self-pay | Admitting: Internal Medicine

## 2013-10-30 NOTE — Telephone Encounter (Signed)
If she is tolerating Fusion Plus (iron) then I would recommend continuing it.  Rx can be given for this and sent to her pharmacy  She should let us know is she has trouble getting it. Thanks [employer]

## 2013-10-31 NOTE — Telephone Encounter (Signed)
Pt reports she's only taken 2 pills so far, so she has not seen dark stools. She did have some cramping in mid day, but that could have been from the Miralax she states; she has not had constipation so far. After discussion, she will try the med for a few more days and call me back next week.

## 2013-11-04 ENCOUNTER — Telehealth: Payer: Self-pay | Admitting: *Deleted

## 2013-11-04 MED ORDER — FUSION PLUS PO CAPS: 1.0000 | ORAL_CAPSULE | Freq: Every day | ORAL | Status: DC

## 2013-11-04 NOTE — Telephone Encounter (Signed)
Pt reports she tolerated the Fusion Plus capsules so [facility] Done.

## 2013-11-05 ENCOUNTER — Other Ambulatory Visit: Payer: Medicare Other

## 2013-11-08 ENCOUNTER — Other Ambulatory Visit: Payer: Medicare Other

## 2013-11-12 ENCOUNTER — Other Ambulatory Visit: Payer: Self-pay

## 2013-11-12 DIAGNOSIS — D509 Iron deficiency anemia, unspecified: Secondary | ICD-10-CM

## 2013-11-19 ENCOUNTER — Other Ambulatory Visit: Payer: Medicare Other

## 2013-11-26 ENCOUNTER — Other Ambulatory Visit (INDEPENDENT_AMBULATORY_CARE_PROVIDER_SITE_OTHER): Payer: Medicare Other

## 2013-11-26 DIAGNOSIS — N760 Acute vaginitis: Secondary | ICD-10-CM | POA: Diagnosis not present

## 2013-11-26 DIAGNOSIS — D509 Iron deficiency anemia, unspecified: Secondary | ICD-10-CM

## 2013-11-26 DIAGNOSIS — L439 Lichen planus, unspecified: Secondary | ICD-10-CM | POA: Diagnosis not present

## 2013-11-26 LAB — FECAL OCCULT BLOOD, IMMUNOCHEMICAL: Fecal Occult Bld: NEGATIVE

## 2013-12-18 ENCOUNTER — Encounter: Payer: Self-pay | Admitting: Internal Medicine

## 2013-12-23 ENCOUNTER — Encounter: Payer: Self-pay | Admitting: Internal Medicine

## 2013-12-23 ENCOUNTER — Other Ambulatory Visit (INDEPENDENT_AMBULATORY_CARE_PROVIDER_SITE_OTHER): Payer: Medicare Other

## 2013-12-23 ENCOUNTER — Ambulatory Visit (INDEPENDENT_AMBULATORY_CARE_PROVIDER_SITE_OTHER): Payer: Medicare Other | Admitting: Internal Medicine

## 2013-12-23 VITALS — BP 122/70 | HR 80 | Ht 64.0 in | Wt 181.4 lb

## 2013-12-23 DIAGNOSIS — K219 Gastro-esophageal reflux disease without esophagitis: Secondary | ICD-10-CM

## 2013-12-23 DIAGNOSIS — K5900 Constipation, unspecified: Secondary | ICD-10-CM | POA: Diagnosis not present

## 2013-12-23 DIAGNOSIS — D509 Iron deficiency anemia, unspecified: Secondary | ICD-10-CM

## 2013-12-23 LAB — CBC
HCT: 46.5 % — ABNORMAL HIGH (ref 36.0–46.0)
Hemoglobin: 15.4 g/dL — ABNORMAL HIGH (ref 12.0–15.0)
MCHC: 33 g/dL (ref 30.0–36.0)
MCV: 84.7 fl (ref 78.0–100.0)
PLATELETS: 201 10*3/uL (ref 150.0–400.0)
RBC: 5.49 Mil/uL — AB (ref 3.87–5.11)
RDW: 23.4 % — ABNORMAL HIGH (ref 11.5–14.6)
WBC: 8.4 10*3/uL (ref 4.5–10.5)

## 2013-12-23 LAB — FERRITIN: Ferritin: 19.2 ng/mL (ref 10.0–291.0)

## 2013-12-23 LAB — IBC PANEL
IRON: 133 ug/dL (ref 42–145)
Saturation Ratios: 32.4 % (ref 20.0–50.0)
TRANSFERRIN: 293.2 mg/dL (ref 212.0–360.0)

## 2013-12-23 MED ORDER — POLYETHYLENE GLYCOL 3350 17 GM/SCOOP PO POWD: 17.0000 g | Freq: Every day | ORAL | Status: DC

## 2013-12-23 NOTE — Patient Instructions (Signed)
Your physician has requested that you go to the basement for the following lab work before leaving today: Iron Studies, CBC  We have sent the following medications to your pharmacy for you to pick up at your convenience: Miralax and continue taking Fusion plus

## 2013-12-23 NOTE — Progress Notes (Signed)
   Subjective:    Patient ID: Sanjuana Mae, female    DOB: [birth year], 73 y.o.   MRN: [id number]  HPI Mrs. Mendel is a 73 year old female past medical history of hypertension, hyperlipidemia, GERD, osteopenia who seen in followup for iron deficiency anemia. She is here alone today. She was last seen about 2 months ago for iron deficiency. At times she was without symptoms other than chronic constipation. Since this visit she is using MiraLax 17 g daily with good response. She has also been using fusion plus has an iron replacement since this visit. She is tolerating this well. Her stools have been darker with iron supplementation. Overall she feels well energy levels are good. She is having a bowel movement approximately every day. No blood in her stool. No nausea or vomiting.  She submitted a fecal occult blood test recently which was negative  Review of Systems As per history of present illness, otherwise negative  Current Medications, Allergies, Past Medical History, Past Surgical History, Family History and Social History were reviewed in [employer] record.     Objective:   Physical Exam BP 122/70  Pulse 80  Ht 5\' 4"  (1.626 m)  Wt 181 lb 6.4 oz (82.283 kg)  BMI 31.12 kg/m2 Constitutional: Well-developed and well-nourished. No distress. HEENT: Normocephalic and atraumatic.  No scleral icterus. Neck: Neck supple. Trachea midline. Cardiovascular: Normal rate, regular rhythm and intact distal pulses.  Pulmonary/chest: Effort normal and breath sounds normal. No wheezing, rales or rhonchi. Abdominal: Soft, nontender, nondistended. Bowel sounds active throughout.  Extremities: no clubbing, cyanosis, or edema Neurological: Alert and oriented to person place and time. Psychiatric: Normal mood and affect. Behavior is normal.  CBC    Component Value Date/Time   WBC 6.6 10/22/2013 0933   RBC 5.26* 10/22/2013 0933   HGB 12.5 10/22/2013 0933   HCT 39.3  10/22/2013 0933   PLT 204.0 10/22/2013 0933   MCV 74.8* 10/22/2013 0933   MCHC 31.8 10/22/2013 0933   RDW 18.2* 10/22/2013 0933   LYMPHSABS 1.7 09/18/2013 1218   MONOABS 0.6 09/18/2013 1218   EOSABS 0.2 09/18/2013 1218   BASOSABS 0.0 09/18/2013 1218    Iron/TIBC/Ferritin    Component Value Date/Time   IRON 33* 10/22/2013 0933   FERRITIN 3.1* 10/22/2013 0933   FOBT neg    Assessment & Plan:  73 year old female past medical history of hypertension, hyperlipidemia, GERD, osteopenia who seen in followup for iron deficiency anemia.  1.  Iron deficiency/borderline anemia -- she clearly had iron deficiency with a ferritin of 3 and December 2014. There was no obvious or overt blood loss. She is up-to-date with screening colonoscopy, last being performed in late 2013.  Given that she was heme-negative we did not proceed to upper endoscopy. I would like to recheck CBC and iron studies today. If she has remained iron deficient, my recommendation is for EGD and possible capsule endoscopy. If her iron studies have improved, endoscopic exam will likely be deferred for now.  2.  Constipation -- mild and responding to MiraLax 17 g daily. She will continue this dose for now  3.  GERD -- taking omeprazole 20 mg daily with good result. She has tried to discontinue this because she hurt it may impair iron absorption. When she did her heartburn became worse and she resumed taking it daily. For now she will continue omeprazole 20 mg daily.

## 2013-12-24 ENCOUNTER — Ambulatory Visit: Payer: Self-pay | Admitting: Obstetrics and Gynecology

## 2013-12-24 ENCOUNTER — Other Ambulatory Visit: Payer: Self-pay

## 2013-12-24 DIAGNOSIS — D649 Anemia, unspecified: Secondary | ICD-10-CM

## 2013-12-24 DIAGNOSIS — Z1231 Encounter for screening mammogram for malignant neoplasm of breast: Secondary | ICD-10-CM | POA: Diagnosis not present

## 2013-12-30 DIAGNOSIS — C44519 Basal cell carcinoma of skin of other part of trunk: Secondary | ICD-10-CM | POA: Diagnosis not present

## 2013-12-30 DIAGNOSIS — D0439 Carcinoma in situ of skin of other parts of face: Secondary | ICD-10-CM | POA: Diagnosis not present

## 2013-12-30 DIAGNOSIS — L570 Actinic keratosis: Secondary | ICD-10-CM | POA: Diagnosis not present

## 2013-12-30 DIAGNOSIS — D0430 Carcinoma in situ of skin of unspecified part of face: Secondary | ICD-10-CM | POA: Diagnosis not present

## 2013-12-30 DIAGNOSIS — D485 Neoplasm of uncertain behavior of skin: Secondary | ICD-10-CM | POA: Diagnosis not present

## 2013-12-30 DIAGNOSIS — L578 Other skin changes due to chronic exposure to nonionizing radiation: Secondary | ICD-10-CM | POA: Diagnosis not present

## 2013-12-30 DIAGNOSIS — L821 Other seborrheic keratosis: Secondary | ICD-10-CM | POA: Diagnosis not present

## 2013-12-30 DIAGNOSIS — L820 Inflamed seborrheic keratosis: Secondary | ICD-10-CM | POA: Diagnosis not present

## 2013-12-30 DIAGNOSIS — Z85828 Personal history of other malignant neoplasm of skin: Secondary | ICD-10-CM | POA: Diagnosis not present

## 2014-01-07 DIAGNOSIS — Z01419 Encounter for gynecological examination (general) (routine) without abnormal findings: Secondary | ICD-10-CM | POA: Diagnosis not present

## 2014-01-07 DIAGNOSIS — N952 Postmenopausal atrophic vaginitis: Secondary | ICD-10-CM | POA: Diagnosis not present

## 2014-01-07 DIAGNOSIS — K5900 Constipation, unspecified: Secondary | ICD-10-CM | POA: Diagnosis not present

## 2014-01-07 DIAGNOSIS — L439 Lichen planus, unspecified: Secondary | ICD-10-CM | POA: Diagnosis not present

## 2014-01-22 DIAGNOSIS — C44519 Basal cell carcinoma of skin of other part of trunk: Secondary | ICD-10-CM | POA: Diagnosis not present

## 2014-01-22 HISTORY — PX: BASAL CELL CARCINOMA EXCISION: SHX1214

## 2014-02-05 DIAGNOSIS — D0439 Carcinoma in situ of skin of other parts of face: Secondary | ICD-10-CM | POA: Diagnosis not present

## 2014-02-05 DIAGNOSIS — D0430 Carcinoma in situ of skin of unspecified part of face: Secondary | ICD-10-CM | POA: Diagnosis not present

## 2014-02-17 ENCOUNTER — Other Ambulatory Visit: Payer: Self-pay | Admitting: *Deleted

## 2014-02-17 MED ORDER — TRAMADOL-ACETAMINOPHEN 37.5-325 MG PO TABS: 2.0000 | ORAL_TABLET | Freq: Every day | ORAL | Status: DC

## 2014-02-17 MED ORDER — CARISOPRODOL 350 MG PO TABS: 350.0000 mg | ORAL_TABLET | Freq: Every day | ORAL | Status: DC

## 2014-02-17 NOTE — Telephone Encounter (Signed)
Please send in, both printed. Thanks.

## 2014-02-17 NOTE — Telephone Encounter (Signed)
Faxed refill request. Last OV 09/12/13.  Last filled Tramadol 180 tablets with 1RF on 08/20/2013.  Last filled Carisoprodol 90 tablets with 1RF on 08/20/2013.  Please advise.

## 2014-02-18 NOTE — Telephone Encounter (Signed)
[facility]

## 2014-07-04 ENCOUNTER — Other Ambulatory Visit (INDEPENDENT_AMBULATORY_CARE_PROVIDER_SITE_OTHER): Payer: Medicare Other

## 2014-07-04 DIAGNOSIS — D649 Anemia, unspecified: Secondary | ICD-10-CM | POA: Diagnosis not present

## 2014-07-05 LAB — IBC PANEL
Iron: 100 ug/dL (ref 42–145)
SATURATION RATIOS: 26.1 % (ref 20.0–50.0)
TRANSFERRIN: 273.6 mg/dL (ref 212.0–360.0)

## 2014-07-05 LAB — CBC WITH DIFFERENTIAL/PLATELET
BASOS ABS: 0.1 10*3/uL (ref 0.0–0.1)
BASOS PCT: 0.9 % (ref 0.0–3.0)
Eosinophils Absolute: 0.3 10*3/uL (ref 0.0–0.7)
Eosinophils Relative: 3.6 % (ref 0.0–5.0)
HCT: 44.6 % (ref 36.0–46.0)
Hemoglobin: 15 g/dL (ref 12.0–15.0)
LYMPHS PCT: 22.9 % (ref 12.0–46.0)
Lymphs Abs: 1.7 10*3/uL (ref 0.7–4.0)
MCHC: 33.7 g/dL (ref 30.0–36.0)
MCV: 92.6 fl (ref 78.0–100.0)
Monocytes Absolute: 0.8 10*3/uL (ref 0.1–1.0)
Monocytes Relative: 9.9 % (ref 3.0–12.0)
NEUTROS PCT: 62.7 % (ref 43.0–77.0)
Neutro Abs: 4.8 10*3/uL (ref 1.4–7.7)
Platelets: 165 10*3/uL (ref 150.0–400.0)
RBC: 4.82 Mil/uL (ref 3.87–5.11)
RDW: 12.9 % (ref 11.5–15.5)
WBC: 7.6 10*3/uL (ref 4.0–10.5)

## 2014-07-08 DIAGNOSIS — L439 Lichen planus, unspecified: Secondary | ICD-10-CM | POA: Diagnosis not present

## 2014-07-28 ENCOUNTER — Other Ambulatory Visit: Payer: Self-pay | Admitting: *Deleted

## 2014-07-28 NOTE — Telephone Encounter (Addendum)
Received refill request for ultracet and soma from mail order phamacy. Pt's last appt with you was a cpx in 10/14, no future appts are scheduled.

## 2014-07-28 NOTE — Telephone Encounter (Signed)
Pt checking on status of refill request from mail order for ultracet and soma. Advised request has been received and approval pending. Pt voiced understanding.

## 2014-07-29 MED ORDER — TRAMADOL-ACETAMINOPHEN 37.5-325 MG PO TABS: 2.0000 | ORAL_TABLET | Freq: Every day | ORAL | Status: DC

## 2014-07-29 MED ORDER — CARISOPRODOL 350 MG PO TABS: 350.0000 mg | ORAL_TABLET | Freq: Every day | ORAL | Status: DC

## 2014-07-29 NOTE — Telephone Encounter (Signed)
Both printed, please send. Schedule CPE.  Thanks.

## 2014-07-29 NOTE — Telephone Encounter (Signed)
Left vm for pt to pick up RX and schedule CPE when she comes.

## 2014-07-30 ENCOUNTER — Ambulatory Visit (INDEPENDENT_AMBULATORY_CARE_PROVIDER_SITE_OTHER): Payer: Medicare Other

## 2014-07-30 DIAGNOSIS — Z23 Encounter for immunization: Secondary | ICD-10-CM | POA: Diagnosis not present

## 2014-07-31 DIAGNOSIS — L578 Other skin changes due to chronic exposure to nonionizing radiation: Secondary | ICD-10-CM | POA: Diagnosis not present

## 2014-07-31 DIAGNOSIS — L821 Other seborrheic keratosis: Secondary | ICD-10-CM | POA: Diagnosis not present

## 2014-07-31 DIAGNOSIS — Z1283 Encounter for screening for malignant neoplasm of skin: Secondary | ICD-10-CM | POA: Diagnosis not present

## 2014-07-31 DIAGNOSIS — Z85828 Personal history of other malignant neoplasm of skin: Secondary | ICD-10-CM | POA: Diagnosis not present

## 2014-07-31 DIAGNOSIS — L820 Inflamed seborrheic keratosis: Secondary | ICD-10-CM | POA: Diagnosis not present

## 2014-08-16 ENCOUNTER — Other Ambulatory Visit: Payer: Self-pay | Admitting: Family Medicine

## 2014-08-18 NOTE — Telephone Encounter (Signed)
Received refill request electronically from pharmacy. Patient has an appointment 09/16/14. Is it okay to refill medications?

## 2014-08-19 NOTE — Telephone Encounter (Signed)
Pt left /vm that mail order pharmacy advised pt her refills had been refused. I called pt back and she had just gotten off phone with mail order pharmacy and trazodone and atrovastatin refills were being processed. Pt asked to forget call.

## 2014-08-19 NOTE — Telephone Encounter (Signed)
Sent, thanks

## 2014-09-03 ENCOUNTER — Other Ambulatory Visit: Payer: Self-pay | Admitting: Family Medicine

## 2014-09-03 DIAGNOSIS — I10 Essential (primary) hypertension: Secondary | ICD-10-CM

## 2014-09-03 DIAGNOSIS — D509 Iron deficiency anemia, unspecified: Secondary | ICD-10-CM

## 2014-09-03 DIAGNOSIS — M8580 Other specified disorders of bone density and structure, unspecified site: Secondary | ICD-10-CM

## 2014-09-04 ENCOUNTER — Other Ambulatory Visit (INDEPENDENT_AMBULATORY_CARE_PROVIDER_SITE_OTHER): Payer: Medicare Other

## 2014-09-04 DIAGNOSIS — E785 Hyperlipidemia, unspecified: Secondary | ICD-10-CM | POA: Diagnosis not present

## 2014-09-04 DIAGNOSIS — I10 Essential (primary) hypertension: Secondary | ICD-10-CM

## 2014-09-04 DIAGNOSIS — D509 Iron deficiency anemia, unspecified: Secondary | ICD-10-CM

## 2014-09-04 DIAGNOSIS — M8580 Other specified disorders of bone density and structure, unspecified site: Secondary | ICD-10-CM

## 2014-09-04 DIAGNOSIS — R7989 Other specified abnormal findings of blood chemistry: Secondary | ICD-10-CM

## 2014-09-04 LAB — CBC WITH DIFFERENTIAL/PLATELET
Basophils Absolute: 0 10*3/uL (ref 0.0–0.1)
Basophils Relative: 0.6 % (ref 0.0–3.0)
EOS ABS: 0.3 10*3/uL (ref 0.0–0.7)
Eosinophils Relative: 4.6 % (ref 0.0–5.0)
HEMATOCRIT: 44.9 % (ref 36.0–46.0)
Hemoglobin: 14.4 g/dL (ref 12.0–15.0)
LYMPHS ABS: 2.2 10*3/uL (ref 0.7–4.0)
Lymphocytes Relative: 33.1 % (ref 12.0–46.0)
MCHC: 32.1 g/dL (ref 30.0–36.0)
MCV: 89 fl (ref 78.0–100.0)
Monocytes Absolute: 0.7 10*3/uL (ref 0.1–1.0)
Monocytes Relative: 10.1 % (ref 3.0–12.0)
NEUTROS PCT: 51.6 % (ref 43.0–77.0)
Neutro Abs: 3.5 10*3/uL (ref 1.4–7.7)
PLATELETS: 191 10*3/uL (ref 150.0–400.0)
RBC: 5.04 Mil/uL (ref 3.87–5.11)
RDW: 13.3 % (ref 11.5–15.5)
WBC: 6.8 10*3/uL (ref 4.0–10.5)

## 2014-09-04 LAB — COMPREHENSIVE METABOLIC PANEL
ALK PHOS: 68 U/L (ref 39–117)
ALT: 17 U/L (ref 0–35)
AST: 22 U/L (ref 0–37)
Albumin: 3.6 g/dL (ref 3.5–5.2)
BILIRUBIN TOTAL: 0.7 mg/dL (ref 0.2–1.2)
BUN: 19 mg/dL (ref 6–23)
CO2: 28 mEq/L (ref 19–32)
Calcium: 10 mg/dL (ref 8.4–10.5)
Chloride: 104 mEq/L (ref 96–112)
Creatinine, Ser: 0.9 mg/dL (ref 0.4–1.2)
GFR: 64.74 mL/min (ref >60.00)
GLUCOSE: 85 mg/dL (ref 70–99)
Potassium: 3.6 mEq/L (ref 3.5–5.1)
Sodium: 141 mEq/L (ref 135–145)
Total Protein: 7.2 g/dL (ref 6.0–8.3)

## 2014-09-04 LAB — LIPID PANEL
CHOLESTEROL: 195 mg/dL (ref 0–200)
HDL: 41.6 mg/dL (ref >39.00)
LDL CALC: 115 mg/dL — AB (ref 0–99)
NonHDL: 153.4
Total CHOL/HDL Ratio: 5
Triglycerides: 192 mg/dL — ABNORMAL HIGH (ref 0.0–149.0)
VLDL: 38.4 mg/dL (ref 0.0–40.0)

## 2014-09-04 LAB — VITAMIN D 25 HYDROXY (VIT D DEFICIENCY, FRACTURES): VITD: 55.62 ng/mL (ref 30.00–100.00)

## 2014-09-16 ENCOUNTER — Encounter: Payer: Medicare Other | Admitting: Family Medicine

## 2014-09-18 ENCOUNTER — Encounter: Payer: Self-pay | Admitting: Family Medicine

## 2014-09-18 ENCOUNTER — Ambulatory Visit (INDEPENDENT_AMBULATORY_CARE_PROVIDER_SITE_OTHER): Payer: Medicare Other | Admitting: Family Medicine

## 2014-09-18 VITALS — BP 140/72 | HR 72 | Temp 98.3°F | Ht 64.0 in | Wt 180.5 lb

## 2014-09-18 DIAGNOSIS — E785 Hyperlipidemia, unspecified: Secondary | ICD-10-CM | POA: Diagnosis not present

## 2014-09-18 DIAGNOSIS — I10 Essential (primary) hypertension: Secondary | ICD-10-CM

## 2014-09-18 DIAGNOSIS — Z0000 Encounter for general adult medical examination without abnormal findings: Secondary | ICD-10-CM | POA: Diagnosis not present

## 2014-09-18 DIAGNOSIS — D509 Iron deficiency anemia, unspecified: Secondary | ICD-10-CM

## 2014-09-18 DIAGNOSIS — Z23 Encounter for immunization: Secondary | ICD-10-CM | POA: Diagnosis not present

## 2014-09-18 DIAGNOSIS — L299 Pruritus, unspecified: Secondary | ICD-10-CM | POA: Insufficient documentation

## 2014-09-18 DIAGNOSIS — Z7189 Other specified counseling: Secondary | ICD-10-CM

## 2014-09-18 MED ORDER — NAPROXEN 500 MG PO TABS: ORAL_TABLET | ORAL | Status: DC

## 2014-09-18 MED ORDER — TRIAMTERENE-HCTZ 37.5-25 MG PO CAPS: ORAL_CAPSULE | ORAL | Status: DC

## 2014-09-18 MED ORDER — MOMETASONE FUROATE 0.1 % EX SOLN: Freq: Every day | CUTANEOUS | Status: DC

## 2014-09-18 MED ORDER — DICLOFENAC SODIUM 1 % TD GEL
2.0000 g | Freq: Four times a day (QID) | TRANSDERMAL | Status: DC
Start: 2014-09-18 — End: 2015-12-25

## 2014-09-18 NOTE — Assessment & Plan Note (Signed)
Use prn topical steroid and f/u prn.  She agrees.

## 2014-09-18 NOTE — Assessment & Plan Note (Signed)
Controlled except for TG, continue as is.  Labs d/w pt.  Diet and exercise encouraged.

## 2014-09-18 NOTE — Patient Instructions (Signed)
Take care.  Glad to see you.  Use the ear medicine and see if that helps.  You can stop the multivitamin.  Let me know if you have questions.  I'll await input from Dr. Nelva Bush.

## 2014-09-18 NOTE — Assessment & Plan Note (Signed)
Controlled, continue as is.  Labs d/w pt.  Diet and exercise encouraged.

## 2014-09-18 NOTE — Progress Notes (Signed)
I have personally reviewed the Medicare Annual Wellness questionnaire and have noted 1. The patient's medical and social history 2. Their use of alcohol, tobacco or illicit drugs 3. Their current medications and supplements 4. The patient's functional ability including ADL's, fall risks, home safety risks and hearing or visual             impairment. 5. Diet and physical activities 6. Evidence for depression or mood disorders  The patients weight, height, BMI have been recorded in the chart and visual acuity is per eye clinic.  I have made referrals, counseling and provided education to the patient based review of the above and I have provided the pt with a written personalized care plan for preventive services.  Provider list updated- see scanned forms.  Routine anticipatory guidance given to patient.  See health maintenance.  Flu 2015 Shingles 2011 PNA 2012, updated today.  Tetanus 2011 Colonoscopy 2013 Breast cancer screening- mammogram 12/2013 per gyn clinic.  DXA per gyn clinic.  Pelvic exam per gyn clinic.  Advance directive- husband designated if patient were incapacitated.  Cognitive function addressed- see scanned forms- and if abnormal then additional documentation follows.   Itchy ears, prev improved with mometasone solution, using sparingly. Has seasonal sx.  No ade on med prev.  Would like to restart.   Elevated Cholesterol: Using medications without problems:yes Muscle aches: no Diet compliance: "terrible" d/w pt, encouraged.  Exercise: some, water aerobics Labs d/w pt.   Hypertension:    Using medication without problems or lightheadedness: yes Chest pain with exertion:no Edema:no Short of breath:no  She has had more hip pain recently and will f/u with Dr. Nelva Bush about that.    It appears that her iron def anemia is resolved, was likely from blood donation.  D/w pt.  She feels well w/o known blood loss.   PMH and SH reviewed  Meds, vitals, and allergies  reviewed.   ROS: See HPI.  Otherwise negative.    GEN: nad, alert and oriented HEENT: mucous membranes moist, TM wnl, OP wnl NECK: supple w/o LA CV: rrr. PULM: ctab, no inc wob ABD: soft, +bs EXT: no edema SKIN: no acute rash

## 2014-09-18 NOTE — Assessment & Plan Note (Signed)
Resolved.  D/w pt.   

## 2014-09-18 NOTE — Assessment & Plan Note (Signed)
Flu 2015 Shingles 2011 PNA 2012, updated today.  Tetanus 2011 Colonoscopy 2013 Breast cancer screening- mammogram 12/2013 per gyn clinic.  DXA per gyn clinic.  Pelvic exam per gyn clinic.  Advance directive- husband designated if patient were incapacitated.  Cognitive function addressed- see scanned forms- and if abnormal then additional documentation follows.

## 2014-09-19 ENCOUNTER — Telehealth: Payer: Self-pay | Admitting: Family Medicine

## 2014-09-19 NOTE — Telephone Encounter (Signed)
emmi emailed °

## 2014-10-01 DIAGNOSIS — L821 Other seborrheic keratosis: Secondary | ICD-10-CM | POA: Diagnosis not present

## 2014-10-01 DIAGNOSIS — Z85828 Personal history of other malignant neoplasm of skin: Secondary | ICD-10-CM | POA: Diagnosis not present

## 2014-10-01 DIAGNOSIS — L720 Epidermal cyst: Secondary | ICD-10-CM | POA: Diagnosis not present

## 2014-12-16 LAB — HM MAMMOGRAPHY

## 2014-12-30 ENCOUNTER — Ambulatory Visit: Payer: Self-pay | Admitting: Obstetrics and Gynecology

## 2014-12-30 DIAGNOSIS — Z1231 Encounter for screening mammogram for malignant neoplasm of breast: Secondary | ICD-10-CM | POA: Diagnosis not present

## 2015-01-05 DIAGNOSIS — H903 Sensorineural hearing loss, bilateral: Secondary | ICD-10-CM | POA: Diagnosis not present

## 2015-01-16 DIAGNOSIS — H903 Sensorineural hearing loss, bilateral: Secondary | ICD-10-CM | POA: Diagnosis not present

## 2015-01-20 ENCOUNTER — Other Ambulatory Visit: Payer: Self-pay | Admitting: *Deleted

## 2015-01-20 NOTE — Telephone Encounter (Signed)
Faxed refill request. Soma Last Filled:    90 tablet 1 07/29/2014  Ultracet Last Filled:    180 tablet 1 07/29/2014  Please advise.

## 2015-01-21 MED ORDER — CARISOPRODOL 350 MG PO TABS: 350.0000 mg | ORAL_TABLET | Freq: Every day | ORAL | Status: DC

## 2015-01-21 MED ORDER — TRAMADOL-ACETAMINOPHEN 37.5-325 MG PO TABS: 2.0000 | ORAL_TABLET | Freq: Every day | ORAL | Status: DC

## 2015-01-21 NOTE — Telephone Encounter (Signed)
Both printed, please fax in after I sign. Thanks.

## 2015-01-21 NOTE — Telephone Encounter (Signed)
Soma and ultracet rx faxed to [phone].

## 2015-03-04 ENCOUNTER — Other Ambulatory Visit: Payer: Self-pay | Admitting: Internal Medicine

## 2015-03-05 DIAGNOSIS — Z90722 Acquired absence of ovaries, bilateral: Secondary | ICD-10-CM | POA: Diagnosis not present

## 2015-03-05 DIAGNOSIS — L439 Lichen planus, unspecified: Secondary | ICD-10-CM | POA: Diagnosis not present

## 2015-03-05 DIAGNOSIS — R638 Other symptoms and signs concerning food and fluid intake: Secondary | ICD-10-CM | POA: Diagnosis not present

## 2015-03-05 DIAGNOSIS — Z1211 Encounter for screening for malignant neoplasm of colon: Secondary | ICD-10-CM | POA: Diagnosis not present

## 2015-03-05 DIAGNOSIS — Z01419 Encounter for gynecological examination (general) (routine) without abnormal findings: Secondary | ICD-10-CM | POA: Diagnosis not present

## 2015-03-05 DIAGNOSIS — N951 Menopausal and female climacteric states: Secondary | ICD-10-CM | POA: Diagnosis not present

## 2015-03-05 DIAGNOSIS — N952 Postmenopausal atrophic vaginitis: Secondary | ICD-10-CM | POA: Diagnosis not present

## 2015-03-17 DIAGNOSIS — Z1211 Encounter for screening for malignant neoplasm of colon: Secondary | ICD-10-CM | POA: Diagnosis not present

## 2015-05-27 ENCOUNTER — Encounter: Payer: Self-pay | Admitting: *Deleted

## 2015-07-22 ENCOUNTER — Other Ambulatory Visit: Payer: Self-pay

## 2015-07-22 MED ORDER — ATORVASTATIN CALCIUM 20 MG PO TABS: 20.0000 mg | ORAL_TABLET | Freq: Every day | ORAL | Status: DC

## 2015-07-22 NOTE — Telephone Encounter (Signed)
Pt [facility]atin [facility]; pt is going to cb to schedule CPX due in 09/2015. Refill done and pt notified.

## 2015-07-27 ENCOUNTER — Telehealth: Payer: Self-pay

## 2015-07-27 NOTE — Telephone Encounter (Signed)
Pt left v/m; pt was notified that tramadol apap and carisoprodol is requiring Prior authorization; pt [facility] and pt request call to [phone] option 2 to initiate PA. Pt request cb when completed.

## 2015-07-28 ENCOUNTER — Other Ambulatory Visit: Payer: Self-pay

## 2015-07-28 ENCOUNTER — Other Ambulatory Visit: Payer: Self-pay | Admitting: Internal Medicine

## 2015-07-28 MED ORDER — TRAMADOL-ACETAMINOPHEN 37.5-325 MG PO TABS: 2.0000 | ORAL_TABLET | Freq: Every day | ORAL | Status: DC

## 2015-07-28 MED ORDER — CARISOPRODOL 350 MG PO TABS: 350.0000 mg | ORAL_TABLET | Freq: Every day | ORAL | Status: DC

## 2015-07-28 NOTE — Telephone Encounter (Signed)
I received 2 refill requests for this patient.  First Rx is [facility]. Last refilled on 01/21/15 for #90 with 1 refills.  Other Rx is Ultracet tabs. Last refilled on 01/21/15 for 180 with 1 refill.  Last office visit was 09/18/14 for a Medicare Wellness Visit.  Ok to refill these medications?

## 2015-07-28 NOTE — Telephone Encounter (Signed)
Please call in both.  Thanks.  CPE this fall when possible.

## 2015-07-29 ENCOUNTER — Telehealth: Payer: Self-pay | Admitting: Family Medicine

## 2015-07-29 NOTE — Telephone Encounter (Signed)
Pt came in stating that her husband answered a call from [place] and didn't remember who it was.. She is also following up on the PA that is needed for her medications. Pt requesting callback at [phone]

## 2015-07-29 NOTE — Telephone Encounter (Signed)
Rx's called in as directed. Message left with husband to call office to schedule CPE.

## 2015-07-29 NOTE — Telephone Encounter (Signed)
Pt left vm returning call and request cb [phone].

## 2015-07-29 NOTE — Telephone Encounter (Signed)
Left message for patient regarding scheduling a CPE. Advised patient to return phone call when possible.

## 2015-07-30 NOTE — Telephone Encounter (Signed)
Pt left v/m requesting cb today with status of prior auths.

## 2015-07-30 NOTE — Telephone Encounter (Signed)
Attempted to complete ePA, but was unable to obtain all information needed and couldn't seen to plan. Shapale notified me that she contacted phone number provided and will have forms faxed over to be completed. I will complete forms when received.

## 2015-07-30 NOTE — Telephone Encounter (Signed)
Notified patient about PA status.

## 2015-07-30 NOTE — Telephone Encounter (Signed)
Patient stated she would call back and let us know about when she could schedule a CPE.

## 2015-08-03 ENCOUNTER — Other Ambulatory Visit: Payer: Self-pay | Admitting: Family Medicine

## 2015-08-03 MED ORDER — TRAZODONE HCL 100 MG PO TABS: ORAL_TABLET | ORAL | Status: DC

## 2015-08-03 NOTE — Telephone Encounter (Signed)
Sent!

## 2015-08-03 NOTE — Telephone Encounter (Signed)
Received fax refill request [facility] mail order for  Trazodone (DESYREL) 100 MG tablet    Take 1.5 tablet (total 150 mg) at bedtime.  Dispense 135 tablet Refill     3   Last prescribed on 08/19/2014. Last seen on 09/18/14 for CPE appt. Next CPE appt on 10/20/15.

## 2015-08-04 ENCOUNTER — Ambulatory Visit: Payer: Medicare Other

## 2015-08-07 ENCOUNTER — Ambulatory Visit (INDEPENDENT_AMBULATORY_CARE_PROVIDER_SITE_OTHER): Payer: Medicare Other

## 2015-08-07 DIAGNOSIS — Z23 Encounter for immunization: Secondary | ICD-10-CM | POA: Diagnosis not present

## 2015-10-04 ENCOUNTER — Other Ambulatory Visit: Payer: Self-pay | Admitting: Family Medicine

## 2015-10-04 DIAGNOSIS — M8580 Other specified disorders of bone density and structure, unspecified site: Secondary | ICD-10-CM

## 2015-10-04 DIAGNOSIS — M899 Disorder of bone, unspecified: Secondary | ICD-10-CM

## 2015-10-04 DIAGNOSIS — I10 Essential (primary) hypertension: Secondary | ICD-10-CM

## 2015-10-05 ENCOUNTER — Other Ambulatory Visit: Payer: Self-pay | Admitting: Family Medicine

## 2015-10-05 DIAGNOSIS — L820 Inflamed seborrheic keratosis: Secondary | ICD-10-CM | POA: Diagnosis not present

## 2015-10-05 DIAGNOSIS — I8393 Asymptomatic varicose veins of bilateral lower extremities: Secondary | ICD-10-CM | POA: Diagnosis not present

## 2015-10-05 DIAGNOSIS — D1800 Hemangioma unspecified site: Secondary | ICD-10-CM | POA: Diagnosis not present

## 2015-10-05 DIAGNOSIS — Z1283 Encounter for screening for malignant neoplasm of skin: Secondary | ICD-10-CM | POA: Diagnosis not present

## 2015-10-05 DIAGNOSIS — D229 Melanocytic nevi, unspecified: Secondary | ICD-10-CM | POA: Diagnosis not present

## 2015-10-05 DIAGNOSIS — L821 Other seborrheic keratosis: Secondary | ICD-10-CM | POA: Diagnosis not present

## 2015-10-05 DIAGNOSIS — Z85828 Personal history of other malignant neoplasm of skin: Secondary | ICD-10-CM | POA: Diagnosis not present

## 2015-10-05 DIAGNOSIS — L812 Freckles: Secondary | ICD-10-CM | POA: Diagnosis not present

## 2015-10-05 DIAGNOSIS — I781 Nevus, non-neoplastic: Secondary | ICD-10-CM | POA: Diagnosis not present

## 2015-10-05 DIAGNOSIS — L578 Other skin changes due to chronic exposure to nonionizing radiation: Secondary | ICD-10-CM | POA: Diagnosis not present

## 2015-10-13 ENCOUNTER — Other Ambulatory Visit (INDEPENDENT_AMBULATORY_CARE_PROVIDER_SITE_OTHER): Payer: Medicare Other

## 2015-10-13 DIAGNOSIS — M899 Disorder of bone, unspecified: Secondary | ICD-10-CM | POA: Diagnosis not present

## 2015-10-13 DIAGNOSIS — I10 Essential (primary) hypertension: Secondary | ICD-10-CM | POA: Diagnosis not present

## 2015-10-13 LAB — LIPID PANEL
Cholesterol: 199 mg/dL (ref 0–200)
HDL: 39.4 mg/dL (ref >39.00)
NONHDL: 159.82
TRIGLYCERIDES: 235 mg/dL — AB (ref 0.0–149.0)
Total CHOL/HDL Ratio: 5
VLDL: 47 mg/dL — ABNORMAL HIGH (ref 0.0–40.0)

## 2015-10-13 LAB — COMPREHENSIVE METABOLIC PANEL
ALT: 10 U/L (ref 0–35)
AST: 17 U/L (ref 0–37)
Albumin: 3.9 g/dL (ref 3.5–5.2)
Alkaline Phosphatase: 64 U/L (ref 39–117)
BILIRUBIN TOTAL: 0.5 mg/dL (ref 0.2–1.2)
BUN: 16 mg/dL (ref 6–23)
CALCIUM: 10 mg/dL (ref 8.4–10.5)
CO2: 30 meq/L (ref 19–32)
CREATININE: 0.8 mg/dL (ref 0.40–1.20)
Chloride: 103 mEq/L (ref 96–112)
GFR: 74.89 mL/min (ref >60.00)
Glucose, Bld: 84 mg/dL (ref 70–99)
Potassium: 4 mEq/L (ref 3.5–5.1)
SODIUM: 140 meq/L (ref 135–145)
Total Protein: 6.7 g/dL (ref 6.0–8.3)

## 2015-10-13 LAB — VITAMIN D 25 HYDROXY (VIT D DEFICIENCY, FRACTURES): VITD: 44.07 ng/mL (ref 30.00–100.00)

## 2015-10-13 LAB — LDL CHOLESTEROL, DIRECT: LDL DIRECT: 115 mg/dL

## 2015-10-20 ENCOUNTER — Encounter: Payer: Self-pay | Admitting: Family Medicine

## 2015-10-20 ENCOUNTER — Ambulatory Visit (INDEPENDENT_AMBULATORY_CARE_PROVIDER_SITE_OTHER): Payer: Medicare Other | Admitting: Family Medicine

## 2015-10-20 VITALS — BP 132/62 | HR 80 | Temp 98.0°F | Ht 64.0 in | Wt 168.8 lb

## 2015-10-20 DIAGNOSIS — I10 Essential (primary) hypertension: Secondary | ICD-10-CM | POA: Diagnosis not present

## 2015-10-20 DIAGNOSIS — L299 Pruritus, unspecified: Secondary | ICD-10-CM

## 2015-10-20 DIAGNOSIS — Z0000 Encounter for general adult medical examination without abnormal findings: Secondary | ICD-10-CM | POA: Diagnosis not present

## 2015-10-20 DIAGNOSIS — M1990 Unspecified osteoarthritis, unspecified site: Secondary | ICD-10-CM

## 2015-10-20 DIAGNOSIS — G4700 Insomnia, unspecified: Secondary | ICD-10-CM

## 2015-10-20 DIAGNOSIS — E785 Hyperlipidemia, unspecified: Secondary | ICD-10-CM | POA: Diagnosis not present

## 2015-10-20 MED ORDER — TRAZODONE HCL 100 MG PO TABS: ORAL_TABLET | ORAL | Status: DC

## 2015-10-20 MED ORDER — MOMETASONE FUROATE 0.1 % EX SOLN: Freq: Every day | CUTANEOUS | Status: AC

## 2015-10-20 MED ORDER — ATORVASTATIN CALCIUM 20 MG PO TABS: 20.0000 mg | ORAL_TABLET | Freq: Every day | ORAL | Status: DC

## 2015-10-20 NOTE — Patient Instructions (Addendum)
Ask gynecology about when you should have another bone density test.  Call ortho about your joint aches.  Take care.  Glad to see you.

## 2015-10-20 NOTE — Progress Notes (Signed)
Pre visit review using our clinic review tool, if applicable. No additional management support is needed unless otherwise documented below in the visit note.  I have personally reviewed the Medicare Annual Wellness questionnaire and have noted 1. The patient's medical and social history 2. Their use of alcohol, tobacco or illicit drugs 3. Their current medications and supplements 4. The patient's functional ability including ADL's, fall risks, home safety risks and hearing or visual             impairment. 5. Diet and physical activities 6. Evidence for depression or mood disorders  The patients weight, height, BMI have been recorded in the chart and visual acuity is per eye clinic.  I have made referrals, counseling and provided education to the patient based review of the above and I have provided the pt with a written personalized care plan for preventive services.  Provider list updated- see scanned forms.  Routine anticipatory guidance given to patient.  See health maintenance.  Flu 2016 Shingles 2011 PNA 2015  Tetanus 2011 Colonoscopy 2013 Breast cancer screening- mammogram 12/2014 per gyn clinic.  DXA per gyn clinic.  Pelvic exam per gyn clinic.  Advance directive- husband designated if patient were incapacitated.  Cognitive function addressed- see scanned forms- and if abnormal then additional documentation follows.   Itchy ears, prev improved with mometasone solution, using sparingly. Has seasonal sx. No ade on med prev. Would like to have in case of need.   Elevated Cholesterol: Using medications without problems:yes Muscle aches: not from statin, likely from OA at baseline.   Diet compliance: "I'm trying" d/w pt, encouraged.  Exercise: some, water aerobics Labs d/w pt.   Hypertension:  Using medication without problems or lightheadedness: yes Chest pain with exertion:no Edema:no Short of breath:no  Trazodone helps some with sleep.  No ADE.  Still  with occ sleepless night but in general better with the medicine.   PMH and SH reviewed  Meds, vitals, and allergies reviewed.   ROS: See HPI.  Otherwise negative.    GEN: nad, alert and oriented HEENT: mucous membranes moist NECK: supple w/o LA CV: rrr. PULM: ctab, no inc wob ABD: soft, +bs EXT: no edema SKIN: no acute rash

## 2015-10-21 NOTE — Assessment & Plan Note (Signed)
Continue prn mometasone.

## 2015-10-21 NOTE — Assessment & Plan Note (Signed)
Flu 2016 Shingles 2011 PNA 2015  Tetanus 2011 Colonoscopy 2013 Breast cancer screening- mammogram 12/2014 per gyn clinic.  DXA per gyn clinic.  Pelvic exam per gyn clinic.  Advance directive- husband designated if patient were incapacitated.  Cognitive function addressed- see scanned forms- and if abnormal then additional documentation follows.

## 2015-10-21 NOTE — Assessment & Plan Note (Signed)
Exam deferred, she'll f/u with ortho.  She agrees.

## 2015-10-21 NOTE — Assessment & Plan Note (Signed)
Trazodone helps some with sleep. No ADE. Still with occ sleepless night but in general better with the medicine.  Continue as is.

## 2015-10-21 NOTE — Assessment & Plan Note (Signed)
Reasonable control, continue work on diet and exercise for TG.  Labs d/w pt.  No change in med.

## 2015-10-21 NOTE — Assessment & Plan Note (Signed)
Reasonable control, continue work on diet and exercise.  Labs d/w pt.  No change in med.

## 2015-11-15 DIAGNOSIS — Z923 Personal history of irradiation: Secondary | ICD-10-CM

## 2015-11-15 HISTORY — DX: Personal history of irradiation: Z92.3

## 2015-11-15 HISTORY — PX: BREAST LUMPECTOMY: SHX2

## 2015-12-25 ENCOUNTER — Other Ambulatory Visit: Payer: Self-pay | Admitting: Family Medicine

## 2015-12-25 NOTE — Telephone Encounter (Signed)
Sent. Thanks.   

## 2015-12-25 NOTE — Telephone Encounter (Signed)
Last f/u 10/2015-CPE. Last Rx written for this medication was 09/2014. pls advise

## 2015-12-31 ENCOUNTER — Telehealth: Payer: Self-pay | Admitting: Internal Medicine

## 2015-12-31 ENCOUNTER — Other Ambulatory Visit: Payer: Self-pay | Admitting: Internal Medicine

## 2015-12-31 MED ORDER — POLYETHYLENE GLYCOL 3350 17 GM/SCOOP PO POWD: ORAL | Status: DC

## 2015-12-31 NOTE — Telephone Encounter (Signed)
Rx sent 

## 2016-01-26 ENCOUNTER — Other Ambulatory Visit: Payer: Self-pay | Admitting: Family Medicine

## 2016-01-26 NOTE — Telephone Encounter (Signed)
Electronic refill request. Carisoprodol Last Filled:    90 tablet 1 07/28/2015  Tramadol Last Filled:    180 tablet 1 07/28/2015  CPE:  10/20/15  Please advise.

## 2016-01-27 NOTE — Telephone Encounter (Signed)
Refills called to pharmacy as instructed. 

## 2016-01-27 NOTE — Telephone Encounter (Signed)
Please call in.  Thanks.   

## 2016-02-29 ENCOUNTER — Ambulatory Visit (INDEPENDENT_AMBULATORY_CARE_PROVIDER_SITE_OTHER): Payer: Medicare Other | Admitting: Internal Medicine

## 2016-02-29 ENCOUNTER — Encounter: Payer: Self-pay | Admitting: Internal Medicine

## 2016-02-29 VITALS — BP 126/70 | HR 72 | Ht 63.5 in | Wt 170.5 lb

## 2016-02-29 DIAGNOSIS — K5900 Constipation, unspecified: Secondary | ICD-10-CM | POA: Diagnosis not present

## 2016-02-29 DIAGNOSIS — Z8639 Personal history of other endocrine, nutritional and metabolic disease: Secondary | ICD-10-CM | POA: Diagnosis not present

## 2016-02-29 DIAGNOSIS — K219 Gastro-esophageal reflux disease without esophagitis: Secondary | ICD-10-CM | POA: Diagnosis not present

## 2016-02-29 MED ORDER — POLYETHYLENE GLYCOL 3350 17 GM/SCOOP PO POWD: ORAL | Status: DC

## 2016-02-29 MED ORDER — OMEPRAZOLE 20 MG PO CPDR: 20.0000 mg | DELAYED_RELEASE_CAPSULE | Freq: Every day | ORAL | Status: DC

## 2016-02-29 NOTE — Progress Notes (Signed)
   Subjective:    Patient ID: Alyssa Nolan, female    DOB: May 25, 1941, 75 y.o.   MRN: [id number]  HPI Alyssa Nolan is a 75 year old female with a past medical history of iron deficiency anemia, GERD, chronic constipation who seen for follow-up. She was last seen in February 2015. She reports that she's been doing her well. She is using MiraLAX 17 g on most days. Occasionally she will skip a dose if her stools become too loose. This medication is been significantly beneficial for her. No blood in her stool or melena. No abdominal pain. She continues on omeprazole 20 mg daily. She tried to come off this medicine even take it every other day and significant reflux symptoms returned.  She denies dysphagia or odynophagia. No change in appetite, nausea or vomiting. Appetite and energy levels have been good. She continues to have arthritis and joint pain that has remained active.  She has had no further issue with anemia or iron deficiency to her knowledge. She had been donating blood frequently and this was likely related to iron deficiency. At last donation her iron was checked before donation and she was allowed to donate.  Review of Systems As per history of present illness, otherwise negative  Current Medications, Allergies, Past Medical History, Past Surgical History, Family History and Social History were reviewed in [employer] record.     Objective:   Physical Exam BP 126/70 mmHg  Pulse 72  Ht 5' 3.5" (1.613 m)  Wt 170 lb 8 oz (77.338 kg)  BMI 29.73 kg/m2 Constitutional: Well-developed and well-nourished. No distress. HEENT: Normocephalic and atraumatic. Oropharynx is clear and moist. No oropharyngeal exudate. Conjunctivae are normal.  No scleral icterus. Neck: Neck supple. Trachea midline. Cardiovascular: Normal rate, regular rhythm and intact distal pulses. No M/R/G Pulmonary/chest: Effort normal and breath sounds normal. No wheezing, rales or  rhonchi. Abdominal: Soft, nontender, nondistended. Bowel sounds active throughout. There are no masses palpable. No hepatosplenomegaly. Extremities: no clubbing, cyanosis, or edema Neurological: Alert and oriented to person place and time. Skin: Skin is warm and dry. No rashes noted. Psychiatric: Normal mood and affect. Behavior is normal.      Assessment & Plan:   75 year old female with a past medical history of iron deficiency anemia, GERD, chronic constipation who seen for follow-up.   1. Chronic constipation -- excellent response to MiraLAX. Continue MiraLAX 17 g daily  2. GERD -- chronic and without alarm symptom. Failed attempt to discontinue PPI. Continue omeprazole 20 mg daily.  3. Iron deficiency with borderline anemia -- improved and off of oral iron. Likely related to blood donation. Labs can be monitored routinely through primary care  4. CRC screening -- normal screening colonoscopy in October 2013, repeat in October 2023  Follow-up in one year, sooner if necessary 15 minutes spent with the patient today. Greater than 50% was spent in counseling and coordination of care with the patient

## 2016-02-29 NOTE — Patient Instructions (Signed)
We have sent the following medications to your pharmacy for you to pick up at your convenience: Miralax 17 grams daily Omeprazole 20 mg daily  Please follow up with Dr Hilarie Fredrickson in 1 year.  If you are age 73 or older, your body mass index should be between 23-30. Your Body mass index is 29.73 kg/(m^2). If this is out of the aforementioned range listed, please consider follow up with your Primary Care Provider.  If you are age 28 or younger, your body mass index should be between 19-25. Your Body mass index is 29.73 kg/(m^2). If this is out of the aformentioned range listed, please consider follow up with your Primary Care Provider.

## 2016-03-09 ENCOUNTER — Encounter: Payer: Self-pay | Admitting: Obstetrics and Gynecology

## 2016-03-09 ENCOUNTER — Ambulatory Visit (INDEPENDENT_AMBULATORY_CARE_PROVIDER_SITE_OTHER): Payer: Medicare Other | Admitting: Obstetrics and Gynecology

## 2016-03-09 VITALS — BP 118/69 | HR 75 | Ht 63.5 in | Wt 171.3 lb

## 2016-03-09 DIAGNOSIS — M8580 Other specified disorders of bone density and structure, unspecified site: Secondary | ICD-10-CM | POA: Diagnosis not present

## 2016-03-09 DIAGNOSIS — N952 Postmenopausal atrophic vaginitis: Secondary | ICD-10-CM | POA: Diagnosis not present

## 2016-03-09 DIAGNOSIS — L439 Lichen planus, unspecified: Secondary | ICD-10-CM

## 2016-03-09 DIAGNOSIS — Z1239 Encounter for other screening for malignant neoplasm of breast: Secondary | ICD-10-CM

## 2016-03-09 DIAGNOSIS — Z1211 Encounter for screening for malignant neoplasm of colon: Secondary | ICD-10-CM

## 2016-03-09 MED ORDER — CLOBETASOL PROPIONATE 0.05 % EX OINT: 1.0000 "application " | TOPICAL_OINTMENT | CUTANEOUS | Status: DC

## 2016-03-09 NOTE — Patient Instructions (Signed)
1. No Pap smear. 2. Mammogram ordered 3. Stool guaiac cards given 4. Continue with calcium and vitamin D supplementation daily 5. Continue with Temovate ointment 0.05% 1-3 times weekly for lichen planus 6. DEXA scan ordered 7. Return in 1 year

## 2016-03-09 NOTE — Progress Notes (Signed)
Patient ID: Alyssa Nolan, female   DOB: [birth year], 75 y.o.   MRN: [id number] ANNUAL PREVENTATIVE CARE GYN  ENCOUNTER NOTE  Subjective:       Alyssa Nolan is a 75 y.o. No obstetric history on file. female here for a routine annual gynecologic exam.  Current complaints: 1. Annual Medicare physical 2. follow up on lichen planus  3. osteopenia.   WANTS  DEXA- LAST ONE WAS 5 YEARS AGO    Patient describes occasional vulvar puritius and irritation associated with lichen planus, well controlled with Temovate.patient uses Temovate ointment 1-3 times per week.  She reports taking vitamin D, calcium supplements and engages in regular exercise. She complains of regular vasomotor symptoms but feels they are managable without medication.  Denies incontinence, urinary leakage or change in bowel movements.   Gynecologic History No LMP recorded. Patient is postmenopausal. Contraception: post menopausal status Last Pap: NO PAP NEEDED. Results were: normal Last mammogram: 2016. Results were: normal  Obstetric History OB History  No data available    Past Medical History  Diagnosis Date  . History of chickenpox   . Hypertension   . Hyperlipidemia   . Arthritis 2011    R hip injection per Dr. Nelva Bush  . GERD (gastroesophageal reflux disease)   . Osteopenia     Tscore -1, consider repeat in ~2013, per Dr. Amalia Hailey with gyn  . 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 Lichen planus     Past Surgical History  Procedure Laterality Date  . Mri, neck  06/23/1998  10/31/2003  . Cardiovascular stress test  02/07/2002  . Mri, hip  01/12/2004  . Breast surgery  1968    Fibroadenoma  . Cesarean section  1972 & 1975  . Endometrial ablation and right ovary removal  2002  . Cholecystectomy  2005  . Right hip injection  03/30/2009  . Removal of excess eyelids and shortening of eyelid tendons  01/2010  . 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 Touch up of excess eyelids   04/20/2010    Current Outpatient Prescriptions on File Prior to Visit  Medication Sig Dispense Refill  . atorvastatin (LIPITOR) 20 MG tablet Take 1 tablet (20 mg total) by mouth daily. 90 tablet 3  . Calcium Carbonate-Vitamin D (CALCIUM 600/VITAMIN D) 600-400 MG-UNIT per tablet Take 1 tablet by mouth daily.      . carisoprodol (SOMA) 350 MG tablet TAKE 1 TABLET BY MOUTH AT BEDTIME 90 tablet 1  . clobetasol ointment (TEMOVATE) AB-123456789 % Apply 1 application topically every other day.    . loratadine (CLARITIN) 10 MG tablet Take 10 mg by mouth daily.      . mometasone (ELOCON) 0.1 % lotion Apply topically daily. 60 mL 1  . naproxen (NAPROSYN) 500 MG tablet TAKE 1 TABLET TWICE A DAY  WITH MEALS 180 tablet 3  . omeprazole (PRILOSEC) 20 MG capsule Take 1 capsule (20 mg total) by mouth daily. 90 capsule 3  . polyethylene glycol powder (GLYCOLAX/MIRALAX) powder TAKE 1 CAPFUL(17G) BY MOUTH DAILY 1054 g 3  . traMADol-acetaminophen (ULTRACET) 37.5-325 MG tablet TAKE 2 TABLETS BY MOUTH AT BEDTIME 180 tablet 1  . traZODone (DESYREL) 100 MG tablet TAKE 1.5 TABLETS(=150MG ) AT BEDTIME 135 tablet 3  . triamterene-hydrochlorothiazide (DYAZIDE) 37.5-25 MG capsule TAKE 1 CAPSULE EVERY       MORNING 90 capsule 3  . VOLTAREN 1 % GEL APPLY 2G TOPICALLY FOUR TIMES DAILY  100 g 3   No current facility-administered medications on file prior to visit.    Allergies  Allergen Reactions  . Codeine     REACTION: Felt like she was going to "pass out"    Social History   Social History  . Marital Status: Married    Spouse Name: N/A  . Number of Children: 2  . Years of Education: N/A   Occupational History  . Retired since 2007 and moved to [place]  . Smoking status: Former Smoker    Types: Cigarettes    Quit date: 11/14/1969  . Smokeless tobacco: Never Used  . Alcohol Use: Yes     Comment: Rare  . Drug Use: No  . Sexual Activity: Not Currently    Birth Control/ Protection:  Post-menopausal   Other Topics Concern  . Not on file   Social History Narrative   From [place]:  [employer] grad   2 kids in [place] cards and likes to sew    Family History  Problem Relation Age of Onset  . COPD Mother     Smoker  . Heart disease Father     Possible CHF  . Stroke Maternal Grandfather 83    CVA  . Colon cancer      mother's 1/2 sister  . Breast cancer Neg Hx   . Diabetes Neg Hx     The following portions of the patient's history were reviewed and updated as appropriate: allergies, current medications, past family history, past medical history, past social history, past surgical history and problem list.  Review of Systems ROS Review of Systems - General ROS: negative for - chills, fatigue, fever, hot flashes, night sweats, weight gain or weight loss Psychological ROS: negative for - anxiety, decreased libido, depression, mood swings, physical abuse or sexual abuse Ophthalmic ROS: negative for - blurry vision, eye pain or loss of vision ENT ROS: negative for - headaches, hearing change, visual changes or vocal changes Allergy and Immunology ROS: negative for - hives, itchy/watery eyes or seasonal allergies Hematological and Lymphatic ROS: negative for - bleeding problems, bruising, swollen lymph nodes or weight loss Endocrine ROS: negative for - galactorrhea, hair pattern changes, hot flashes, malaise/lethargy, mood swings, palpitations, polydipsia/polyuria, skin changes, temperature intolerance or unexpected weight changes Breast ROS: negative for - new or changing breast lumps or nipple discharge Respiratory ROS: negative for - cough or shortness of breath Cardiovascular ROS: negative for - chest pain, irregular heartbeat, palpitations or shortness of breath Gastrointestinal ROS: no abdominal pain, change in bowel habits, or black or bloody stools Genito-Urinary ROS: no dysuria, trouble voiding, or hematuria Musculoskeletal ROS:  negative for - joint pain or joint stiffness Neurological ROS: negative for - bowel and bladder control changes Dermatological ROS: negative for rash and skin lesion changes   Objective:   BP 118/69 mmHg  Pulse 75  Ht 5' 3.5" (1.613 m)  Wt 171 lb 4.8 oz (77.701 kg)  BMI 29.86 kg/m2 CONSTITUTIONAL: Well-developed, well-nourished female in no acute distress.  PSYCHIATRIC: Normal mood and affect. Normal behavior. Normal judgment and thought content. [place]: Alert and oriented to person, place, and time. Normal muscle tone coordination. No cranial nerve deficit noted. HENT:  Normocephalic, atraumatic, External right and left ear normal. Oropharynx is clear and moist EYES: Conjunctivae and EOM are normal. Pupils are equal, round, and reactive to light. No scleral icterus.  NECK: Normal range of motion, supple,  no masses.  Normal thyroid.  SKIN: Skin is warm, dry and appropriately thinned for age.  No rash noted. Not diaphoretic. No erythema. No pallor. CARDIOVASCULAR: Normal heart rate noted, regular rhythm, no murmur, rubs or gallops. RESPIRATORY: Clear to auscultation bilaterally. Effort and breath sounds normal, no problems with respiration noted. BREASTS: Symmetric in size. No masses, skin changes, nipple drainage, or lymphadenopathy. ABDOMEN: Soft, normal bowel sounds, no distention noted.  No tenderness, rebound or guarding.  BLADDER: Normal PELVIC:  External Genitalia: mild vulvar hyperemia. No leukoplakia or epithelial skin breakdown; no erosions  BUS: Normal  Vagina: moderate - severe atrophy  Cervix: surgically absent  Uterus: surgically absent  Adnexa: Normal  RV: mild perianal hyperemia,, No Rectal Masses and Normal Sphincter tone  MUSCULOSKELETAL: Normal range of motion. No tenderness.  No cyanosis, clubbing, or edema.  2+ distal pulses. LYMPHATIC: No Axillary, Supraclavicular, or Inguinal Adenopathy.    Assessment:   Annual gynecologic examination 75  y.o. Contraception: post menopausal status [id number] Lichen planus, stable on Temovate ointment 1-3 times per week. Osteopenia  Plan:  Pap: Not needed Mammogram: Ordered Stool Guaiac Testing:  Ordered Labs: THRU PCP Routine preventative health maintenance measures emphasized: Daily use of calcium and vitamin D supplements,, Exercise/Diet/Weight control, Tobacco Warnings, Alcohol/Substance use risks and Stress Management Continue with Temovate ointment 0.05% 1-3 times weekly for lichen planus  DEXA scan order to assess osteopenia Return to [place], [place]. Penninger Brayton Mars, MD   I have seen, interviewed, and examined the patient in conjunction with the [facility].A. student and affirm the diagnosis and management plan. [place] Nine A. Ipek [place], MD, FACOG   Note: This dictation was prepared with Dragon dictation along with smaller phrase technology. Any transcriptional errors that result from this process are unintentional.

## 2016-03-10 DIAGNOSIS — Z1211 Encounter for screening for malignant neoplasm of colon: Secondary | ICD-10-CM | POA: Diagnosis not present

## 2016-03-16 ENCOUNTER — Ambulatory Visit: Payer: Medicare Other

## 2016-03-16 LAB — FECAL OCCULT BLOOD, IMMUNOCHEMICAL: Fecal Occult Bld: NEGATIVE

## 2016-03-23 ENCOUNTER — Ambulatory Visit
Admission: RE | Admit: 2016-03-23 | Discharge: 2016-03-23 | Disposition: A | Discharge status: Home / Self Care | Payer: Medicare Other | Source: Ambulatory Visit | Attending: Obstetrics and Gynecology | Admitting: Obstetrics and Gynecology

## 2016-03-23 ENCOUNTER — Other Ambulatory Visit: Payer: Self-pay | Admitting: Obstetrics and Gynecology

## 2016-03-23 DIAGNOSIS — Z13820 Encounter for screening for osteoporosis: Secondary | ICD-10-CM | POA: Diagnosis not present

## 2016-03-23 DIAGNOSIS — M8589 Other specified disorders of bone density and structure, multiple sites: Secondary | ICD-10-CM | POA: Diagnosis not present

## 2016-03-23 DIAGNOSIS — M8580 Other specified disorders of bone density and structure, unspecified site: Secondary | ICD-10-CM | POA: Diagnosis not present

## 2016-03-23 DIAGNOSIS — Z1239 Encounter for other screening for malignant neoplasm of breast: Secondary | ICD-10-CM

## 2016-03-23 DIAGNOSIS — Z1231 Encounter for screening mammogram for malignant neoplasm of breast: Secondary | ICD-10-CM

## 2016-03-24 ENCOUNTER — Other Ambulatory Visit: Payer: Self-pay | Admitting: Obstetrics and Gynecology

## 2016-03-24 DIAGNOSIS — R928 Other abnormal and inconclusive findings on diagnostic imaging of breast: Secondary | ICD-10-CM

## 2016-03-31 DIAGNOSIS — L812 Freckles: Secondary | ICD-10-CM | POA: Diagnosis not present

## 2016-03-31 DIAGNOSIS — L820 Inflamed seborrheic keratosis: Secondary | ICD-10-CM | POA: Diagnosis not present

## 2016-03-31 DIAGNOSIS — L821 Other seborrheic keratosis: Secondary | ICD-10-CM | POA: Diagnosis not present

## 2016-03-31 DIAGNOSIS — L570 Actinic keratosis: Secondary | ICD-10-CM | POA: Diagnosis not present

## 2016-03-31 DIAGNOSIS — Z85828 Personal history of other malignant neoplasm of skin: Secondary | ICD-10-CM | POA: Diagnosis not present

## 2016-03-31 DIAGNOSIS — L578 Other skin changes due to chronic exposure to nonionizing radiation: Secondary | ICD-10-CM | POA: Diagnosis not present

## 2016-04-20 ENCOUNTER — Ambulatory Visit
Admission: RE | Admit: 2016-04-20 | Discharge: 2016-04-20 | Disposition: A | Discharge status: Home / Self Care | Payer: Medicare Other | Source: Ambulatory Visit | Attending: Obstetrics and Gynecology | Admitting: Obstetrics and Gynecology

## 2016-04-20 DIAGNOSIS — R928 Other abnormal and inconclusive findings on diagnostic imaging of breast: Secondary | ICD-10-CM

## 2016-04-20 DIAGNOSIS — N6489 Other specified disorders of breast: Secondary | ICD-10-CM | POA: Diagnosis not present

## 2016-04-21 ENCOUNTER — Other Ambulatory Visit: Payer: Self-pay | Admitting: Obstetrics and Gynecology

## 2016-04-21 DIAGNOSIS — R928 Other abnormal and inconclusive findings on diagnostic imaging of breast: Secondary | ICD-10-CM

## 2016-04-28 ENCOUNTER — Ambulatory Visit
Admission: RE | Admit: 2016-04-28 | Discharge: 2016-04-28 | Disposition: A | Discharge status: Home / Self Care | Payer: Medicare Other | Source: Ambulatory Visit | Attending: Obstetrics and Gynecology | Admitting: Obstetrics and Gynecology

## 2016-04-28 DIAGNOSIS — C50811 Malignant neoplasm of overlapping sites of right female breast: Secondary | ICD-10-CM | POA: Diagnosis not present

## 2016-04-28 DIAGNOSIS — R928 Other abnormal and inconclusive findings on diagnostic imaging of breast: Secondary | ICD-10-CM

## 2016-04-28 DIAGNOSIS — C50919 Malignant neoplasm of unspecified site of unspecified female breast: Secondary | ICD-10-CM

## 2016-04-28 DIAGNOSIS — R921 Mammographic calcification found on diagnostic imaging of breast: Secondary | ICD-10-CM | POA: Diagnosis not present

## 2016-04-28 HISTORY — DX: Malignant neoplasm of unspecified site of unspecified female breast: C50.919

## 2016-04-28 HISTORY — PX: BREAST BIOPSY: SHX20

## 2016-05-02 ENCOUNTER — Telehealth: Payer: Self-pay | Admitting: Obstetrics and Gynecology

## 2016-05-02 ENCOUNTER — Encounter: Payer: Self-pay | Admitting: General Surgery

## 2016-05-02 DIAGNOSIS — D0591 Unspecified type of carcinoma in situ of right breast: Secondary | ICD-10-CM

## 2016-05-02 NOTE — Telephone Encounter (Signed)
Patient called requesting a referral to dr byrnett at [facility] surgical as a result of her biopsy. Thanks

## 2016-05-02 NOTE — Telephone Encounter (Signed)
Pt aware referral to Dr Terri Piedra office is done.

## 2016-05-03 ENCOUNTER — Encounter: Payer: Self-pay | Admitting: General Surgery

## 2016-05-03 ENCOUNTER — Ambulatory Visit (INDEPENDENT_AMBULATORY_CARE_PROVIDER_SITE_OTHER): Payer: Medicare Other | Admitting: General Surgery

## 2016-05-03 ENCOUNTER — Other Ambulatory Visit: Payer: Self-pay

## 2016-05-03 VITALS — BP 128/74 | HR 62 | Resp 12 | Ht 64.0 in | Wt 169.0 lb

## 2016-05-03 DIAGNOSIS — C50911 Malignant neoplasm of unspecified site of right female breast: Secondary | ICD-10-CM | POA: Diagnosis not present

## 2016-05-03 DIAGNOSIS — D0511 Intraductal carcinoma in situ of right breast: Secondary | ICD-10-CM

## 2016-05-03 DIAGNOSIS — C50411 Malignant neoplasm of upper-outer quadrant of right female breast: Secondary | ICD-10-CM | POA: Insufficient documentation

## 2016-05-03 NOTE — Patient Instructions (Addendum)
The patient is aware to call back for any questions or concerns.  Patient is scheduled for surgery at [facility] on 06/10/16. She will pre admit by phone. Patient is aware of date and instructions.

## 2016-05-03 NOTE — Progress Notes (Signed)
Patient ID: Alyssa Nolan, female   DOB: [birth year], 75 y.o.   MRN: [id number]  Chief Complaint  Patient presents with  . Breast Problem    positive breast biopsy    HPI Alyssa Nolan is a 75 y.o. female.  who presents for a breast evaluation. The most recent mammogram was done on 04-20-16. 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 Patient does not perform regular self breast checks and gets regular mammograms done.  She states she could not feel anything different in the breast.  She is here today with her husband [name] of 70 years.   HPI  Past Medical History  Diagnosis Date  . History of chickenpox   . Hypertension   . Hyperlipidemia   . Arthritis 2011    R hip injection per Dr. Nelva Bush  . GERD (gastroesophageal reflux disease)   . Osteopenia     Tscore -1, consider repeat in ~2013, per Dr. Amalia Hailey with gyn  . Lichen planus   . 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 Mri, neck  06/23/1998  10/31/2003  . Cardiovascular stress test  02/07/2002  . Mri, hip  01/12/2004  . Cesarean section  1972 & 1975  . Endometrial ablation and right ovary removal  2002  . Right hip injection  03/30/2009  . Removal of excess eyelids and shortening of eyelid tendons  01/2010  . 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 Touch up of excess eyelids  04/20/2010  . Breast excisional biopsy Right 1968    NEG  . Oophorectomy Bilateral 15+ YRS AGO  . Colonoscopy  04-22-02    Dr Ricarda Frame, [place]  . Cholecystectomy  2005    Dr Helane Rima, [place]  . Colonoscopy  09-02-12    Dr Bary Castilla  . Basal cell carcinoma excision  01-22-14    chest area. Dr Nehemiah Massed  . Breast surgery  1968    Fibroadenoma  . Breast biopsy Right 2013    CORE - NEG, cyst with ductal hyperplasia without atypia Dr Bary Castilla  . Breast cyst aspiration Bilateral   .  Breast biopsy Right 04-28-16    Invasive ductal carcinoma    Family History  Problem Relation Age of Onset  . COPD Mother     Smoker  . Heart disease Father     Possible CHF  . Stroke Maternal Grandfather 83    CVA  . Colon cancer      mother's 1/2 sister  . Breast cancer Neg Hx   . Diabetes Neg Hx     Social History Social History  Substance Use Topics  . Smoking status: Former Smoker    Types: Cigarettes    Quit date: 11/14/1969  . Smokeless tobacco: Never Used  . Alcohol Use: Yes     Comment: Rare    Allergies  Allergen Reactions  . Codeine     REACTION: Felt like she was going to "pass out"    Current Outpatient Prescriptions  Medication Sig Dispense Refill  . atorvastatin (LIPITOR) 20 MG tablet Take 1 tablet (20 mg total) by mouth daily. 90 tablet 3  . Calcium Carbonate-Vitamin D (CALCIUM 600/VITAMIN D) 600-400 MG-UNIT per tablet Take 1 tablet by mouth daily.      . carisoprodol (SOMA) 350 MG tablet TAKE  1 TABLET BY MOUTH AT BEDTIME 90 tablet 1  . clobetasol ointment (TEMOVATE) AB-123456789 % Apply 1 application topically every other day. 30 g 1  . loratadine (CLARITIN) 10 MG tablet Take 10 mg by mouth daily.      . mometasone (ELOCON) 0.1 % lotion Apply topically daily. 60 mL 1  . naproxen (NAPROSYN) 500 MG tablet TAKE 1 TABLET TWICE A DAY  WITH MEALS 180 tablet 3  . omeprazole (PRILOSEC) 20 MG capsule Take 1 capsule (20 mg total) by mouth daily. 90 capsule 3  . polyethylene glycol powder (GLYCOLAX/MIRALAX) powder TAKE 1 CAPFUL(17G) BY MOUTH DAILY 1054 g 3  . traMADol-acetaminophen (ULTRACET) 37.5-325 MG tablet TAKE 2 TABLETS BY MOUTH AT BEDTIME 180 tablet 1  . traZODone (DESYREL) 100 MG tablet TAKE 1.5 TABLETS(=150MG ) AT BEDTIME 135 tablet 3  . triamterene-hydrochlorothiazide (DYAZIDE) 37.5-25 MG capsule TAKE 1 CAPSULE EVERY       MORNING 90 capsule 3  . VOLTAREN 1 % GEL APPLY 2G TOPICALLY FOUR TIMES DAILY 100 g 3   No current facility-administered medications for  this visit.    Review of Systems Review of Systems  Constitutional: Negative.   Respiratory: Negative.   Cardiovascular: Negative.     Blood pressure 128/74, pulse 62, resp. rate 12, height 5\' 4"  (1.626 m), weight 169 lb (76.658 kg).  Physical Exam Physical Exam  Constitutional: She is oriented to person, place, and time. She appears well-developed and well-nourished.  HENT:  Mouth/Throat: Oropharynx is clear and moist.  Eyes: Conjunctivae are normal. No scleral icterus.  Neck: Neck supple.  Cardiovascular: Normal rate, regular rhythm and normal heart sounds.   Pulmonary/Chest: Effort normal and breath sounds normal. Right breast exhibits no inverted nipple, no mass, no nipple discharge, no skin change and no tenderness. Left breast exhibits no inverted nipple, no mass, no nipple discharge, no skin change and no tenderness.  Lymphadenopathy:    She has no cervical adenopathy.    She has no axillary adenopathy.  Neurological: She is alert and oriented to person, place, and time.  Skin: Skin is warm and dry.  Psychiatric: Her behavior is normal.    Data Reviewed Breast, right, needle core biopsy - INVASIVE DUCTAL CARCINOMA. - DUCTAL CARCINOMA IN SITU. - ASSOCIATED CALCIFICATIONS. - SEE COMMENT. Microscopic Comment Immunohistochemical stains are performed on representative tumor for calponin, p63 and smooth muscle myosin heavy chain. 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 Breast prognostic markers will be performed and reported in an addendum.  The patient had screening mammograms on 03/24/2016. She is brought back for additional views on 04/20/2016 at which  time an ultrasound was completed as well. BI-RADS-4 for microcalcifications and suspected architectural distortion. She subsequently underwent a stereotactic 3-D guided biopsy in [place] on 04/28/2016.  Ultrasound completed today poorly delineates the biopsy cavity.    Assessment    Stage I carcinoma the right breast.    Plan      The majority of the one hour visit was spent reviewing the options for breast cancer treatment. Breast conservation with lumpectomy and radiation therapy  was presented as equivalent to mastectomy for long-term control. The pros and cons of each treatment regimen were reviewed. The indications  for additional therapy such as chemotherapy were touched on briefly, realizing that the majority of information required to determine if chemotherapy would be of benefit is not available at this time. The availability of consultation services for medical oncology and radiation oncology prior to surgery were reviewed.  Additional treatment modalities would be recommended based on final pathology, hormone receptor status and node status. She may benefit from molecular tumor testing.  The patient reports being very comfortable with the idea of breast conservation. An informational brochure and website resources were provided.    Patient is scheduled for surgery at [facility] on [facility]10[facility]. She will pre admit by phone. Patient is aware of date and instructions.   PCP:  [facility] [place] Ref Dr. Enzo Bi  This information has been scribed by Karie Fetch RN, BSN,BC.   Robert Bellow 05/03/2016, 2:06 PM

## 2016-05-05 ENCOUNTER — Ambulatory Visit: Payer: Medicare Other

## 2016-05-06 ENCOUNTER — Other Ambulatory Visit: Payer: Self-pay | Admitting: General Surgery

## 2016-05-06 DIAGNOSIS — C50911 Malignant neoplasm of unspecified site of right female breast: Secondary | ICD-10-CM

## 2016-05-11 ENCOUNTER — Ambulatory Visit: Admission: RE | Admit: 2016-05-11 | Payer: Medicare Other | Source: Ambulatory Visit

## 2016-05-11 ENCOUNTER — Ambulatory Visit: Payer: Medicare Other

## 2016-06-02 ENCOUNTER — Other Ambulatory Visit: Payer: Medicare Other

## 2016-06-02 ENCOUNTER — Encounter: Payer: Self-pay | Admitting: *Deleted

## 2016-06-02 NOTE — Patient Instructions (Signed)
  Your procedure is scheduled on:06-10-16 (FRIDAY) Report to [place]    Remember: Instructions that are not followed completely may result in serious medical risk, up to and including death, or upon the discretion of your surgeon and anesthesiologist your surgery may need to be rescheduled.    _x___ 1. Do not eat food or drink liquids after midnight. No gum chewing or hard candies.     __x__ 2. No Alcohol for 24 hours before or after surgery.   __x__3. No Smoking for 24 prior to surgery.   ____  4. Bring all medications with you on the day of surgery if instructed.    __x__ 5. Notify your doctor if there is any change in your medical condition     (cold, fever, infections).     Do not wear jewelry, make-up, hairpins, clips or nail polish.  Do not wear lotions, powders, or perfumes. You may wear deodorant.  Do not shave 48 hours prior to surgery. Men may shave face and neck.  Do not bring valuables to the hospital.    [facility] is not responsible for any belongings or valuables.               Contacts, dentures or bridgework may not be worn into surgery.  Leave your suitcase in the car. After surgery it may be brought to your room.  For patients admitted to the hospital, discharge time is determined by your treatment team.   Patients discharged the day of surgery will not be allowed to drive home.    Please read over the following fact sheets that you were given:   [facility] Preparing for Surgery and or MRSA Information   _x___ Take these medicines the morning of surgery with A SIP OF WATER:    1. PRILOSEC  2.  3.  4.  5.  6.  ____ Fleet Enema (as directed)   _x___ Use CHG Soap or sage wipes as directed on instruction sheet   ____ Use inhalers on the day of surgery and bring to hospital day of surgery  ____ Stop metformin 2 days prior to surgery    ____ Take 1/2 of usual insulin dose the night before surgery and none on the morning of surgery.    ____ Stop aspirin or coumadin, or plavix  ___ Stop Anti-inflammatories such as Advil, Aleve, Ibuprofen, Motrin, Naproxen,          Naprosyn, Goodies powders or aspirin products. Ok to take Tylenol.   ____ Stop supplements until after surgery.    ____ Bring C-Pap to the hospital.

## 2016-06-06 ENCOUNTER — Encounter
Admission: RE | Admit: 2016-06-06 | Discharge: 2016-06-06 | Disposition: A | Discharge status: Home / Self Care | Payer: Medicare Other | Source: Ambulatory Visit | Attending: General Surgery | Admitting: General Surgery

## 2016-06-06 DIAGNOSIS — Z01810 Encounter for preprocedural cardiovascular examination: Secondary | ICD-10-CM | POA: Insufficient documentation

## 2016-06-06 DIAGNOSIS — Z01812 Encounter for preprocedural laboratory examination: Secondary | ICD-10-CM | POA: Insufficient documentation

## 2016-06-06 LAB — POTASSIUM: Potassium: 3.8 mmol/L (ref 3.5–5.1)

## 2016-06-10 ENCOUNTER — Ambulatory Visit
Admission: RE | Admit: 2016-06-10 | Discharge: 2016-06-10 | Disposition: A | Discharge status: Home / Self Care | Payer: Medicare Other | Source: Ambulatory Visit | Attending: General Surgery | Admitting: General Surgery

## 2016-06-10 ENCOUNTER — Encounter: Payer: Self-pay | Admitting: Anesthesiology

## 2016-06-10 ENCOUNTER — Ambulatory Visit: Payer: Medicare Other | Admitting: Certified Registered Nurse Anesthetist

## 2016-06-10 ENCOUNTER — Encounter
Admission: RE | Disposition: A | Discharge status: Home / Self Care | Payer: Self-pay | Source: Ambulatory Visit | Attending: General Surgery

## 2016-06-10 DIAGNOSIS — Z90722 Acquired absence of ovaries, bilateral: Secondary | ICD-10-CM | POA: Diagnosis not present

## 2016-06-10 DIAGNOSIS — Z85828 Personal history of other malignant neoplasm of skin: Secondary | ICD-10-CM | POA: Insufficient documentation

## 2016-06-10 DIAGNOSIS — Z885 Allergy status to narcotic agent status: Secondary | ICD-10-CM | POA: Diagnosis not present

## 2016-06-10 DIAGNOSIS — D0511 Intraductal carcinoma in situ of right breast: Secondary | ICD-10-CM | POA: Diagnosis not present

## 2016-06-10 DIAGNOSIS — Z170 Estrogen receptor positive status [ER+]: Secondary | ICD-10-CM | POA: Diagnosis not present

## 2016-06-10 DIAGNOSIS — I10 Essential (primary) hypertension: Secondary | ICD-10-CM | POA: Insufficient documentation

## 2016-06-10 DIAGNOSIS — C50911 Malignant neoplasm of unspecified site of right female breast: Secondary | ICD-10-CM

## 2016-06-10 DIAGNOSIS — Z9049 Acquired absence of other specified parts of digestive tract: Secondary | ICD-10-CM | POA: Insufficient documentation

## 2016-06-10 DIAGNOSIS — Z87891 Personal history of nicotine dependence: Secondary | ICD-10-CM | POA: Diagnosis not present

## 2016-06-10 DIAGNOSIS — M1990 Unspecified osteoarthritis, unspecified site: Secondary | ICD-10-CM | POA: Insufficient documentation

## 2016-06-10 DIAGNOSIS — L439 Lichen planus, unspecified: Secondary | ICD-10-CM | POA: Insufficient documentation

## 2016-06-10 DIAGNOSIS — M8580 Other specified disorders of bone density and structure, unspecified site: Secondary | ICD-10-CM | POA: Diagnosis not present

## 2016-06-10 DIAGNOSIS — D649 Anemia, unspecified: Secondary | ICD-10-CM | POA: Insufficient documentation

## 2016-06-10 DIAGNOSIS — C50411 Malignant neoplasm of upper-outer quadrant of right female breast: Secondary | ICD-10-CM | POA: Diagnosis not present

## 2016-06-10 DIAGNOSIS — K219 Gastro-esophageal reflux disease without esophagitis: Secondary | ICD-10-CM | POA: Diagnosis not present

## 2016-06-10 DIAGNOSIS — C50412 Malignant neoplasm of upper-outer quadrant of left female breast: Secondary | ICD-10-CM | POA: Diagnosis not present

## 2016-06-10 DIAGNOSIS — R928 Other abnormal and inconclusive findings on diagnostic imaging of breast: Secondary | ICD-10-CM | POA: Diagnosis not present

## 2016-06-10 DIAGNOSIS — E785 Hyperlipidemia, unspecified: Secondary | ICD-10-CM | POA: Diagnosis not present

## 2016-06-10 HISTORY — PX: BREAST LUMPECTOMY WITH NEEDLE LOCALIZATION: SHX5759

## 2016-06-10 HISTORY — PX: BREAST LUMPECTOMY WITH SENTINEL LYMPH NODE BIOPSY: SHX5597

## 2016-06-10 SURGERY — BREAST LUMPECTOMY WITH SENTINEL LYMPH NODE BX
Anesthesia: General | Laterality: Right | Wound class: Clean

## 2016-06-10 MED ORDER — MIDAZOLAM HCL 2 MG/2ML IJ SOLN
INTRAMUSCULAR | Status: DC | PRN
  Administered 2016-06-10: 1 mg via INTRAVENOUS

## 2016-06-10 MED ORDER — ACETAMINOPHEN 10 MG/ML IV SOLN
INTRAVENOUS | Status: DC | PRN
  Administered 2016-06-10: 1000 mg via INTRAVENOUS

## 2016-06-10 MED ORDER — FENTANYL CITRATE (PF) 100 MCG/2ML IJ SOLN
25.0000 ug | INTRAMUSCULAR | Status: DC | PRN
  Administered 2016-06-10 (×4): 25 ug via INTRAVENOUS

## 2016-06-10 MED ORDER — METHYLENE BLUE 0.5 % INJ SOLN
INTRAVENOUS | Status: AC
  Filled 2016-06-10: qty 10

## 2016-06-10 MED ORDER — LACTATED RINGERS IV SOLN
INTRAVENOUS | Status: DC
  Administered 2016-06-10: 09:00:00 via INTRAVENOUS

## 2016-06-10 MED ORDER — FENTANYL CITRATE (PF) 100 MCG/2ML IJ SOLN
INTRAMUSCULAR | Status: DC | PRN
  Administered 2016-06-10 (×2): 50 ug via INTRAVENOUS

## 2016-06-10 MED ORDER — DEXAMETHASONE SODIUM PHOSPHATE 10 MG/ML IJ SOLN
INTRAMUSCULAR | Status: DC | PRN
  Administered 2016-06-10: 10 mg via INTRAVENOUS

## 2016-06-10 MED ORDER — TECHNETIUM TC 99M SULFUR COLLOID
1.0200 | Freq: Once | INTRAVENOUS | Status: AC | PRN
  Administered 2016-06-10: 1.02 via INTRAVENOUS

## 2016-06-10 MED ORDER — METHYLENE BLUE 0.5 % INJ SOLN
INTRAVENOUS | Status: DC | PRN
  Administered 2016-06-10: 5 mL via SUBMUCOSAL

## 2016-06-10 MED ORDER — KETOROLAC TROMETHAMINE 30 MG/ML IJ SOLN
INTRAMUSCULAR | Status: DC | PRN
  Administered 2016-06-10: 30 mg via INTRAVENOUS

## 2016-06-10 MED ORDER — BUPIVACAINE-EPINEPHRINE (PF) 0.5% -1:200000 IJ SOLN
INTRAMUSCULAR | Status: AC
  Filled 2016-06-10: qty 30

## 2016-06-10 MED ORDER — ACETAMINOPHEN 10 MG/ML IV SOLN
INTRAVENOUS | Status: AC
  Filled 2016-06-10: qty 100

## 2016-06-10 MED ORDER — ONDANSETRON HCL 4 MG/2ML IJ SOLN: 4.0000 mg | Freq: Once | INTRAMUSCULAR | Status: DC | PRN

## 2016-06-10 MED ORDER — PHENYLEPHRINE HCL 10 MG/ML IJ SOLN
INTRAMUSCULAR | Status: DC | PRN
  Administered 2016-06-10 (×4): 100 ug via INTRAVENOUS

## 2016-06-10 MED ORDER — BUPIVACAINE-EPINEPHRINE (PF) 0.5% -1:200000 IJ SOLN
INTRAMUSCULAR | Status: DC | PRN
  Administered 2016-06-10: 30 mL via PERINEURAL

## 2016-06-10 MED ORDER — LIDOCAINE HCL (CARDIAC) 20 MG/ML IV SOLN
INTRAVENOUS | Status: DC | PRN
  Administered 2016-06-10: 50 mg via INTRAVENOUS

## 2016-06-10 MED ORDER — ONDANSETRON HCL 4 MG/2ML IJ SOLN
INTRAMUSCULAR | Status: DC | PRN
  Administered 2016-06-10: 4 mg via INTRAVENOUS

## 2016-06-10 MED ORDER — GLYCOPYRROLATE 0.2 MG/ML IJ SOLN
INTRAMUSCULAR | Status: DC | PRN
  Administered 2016-06-10: .2 mg via INTRAVENOUS

## 2016-06-10 MED ORDER — HYDROCODONE-ACETAMINOPHEN 5-325 MG PO TABS: 1.0000 | ORAL_TABLET | Freq: Four times a day (QID) | ORAL | 0 refills | Status: DC | PRN

## 2016-06-10 MED ORDER — PROPOFOL 10 MG/ML IV BOLUS
INTRAVENOUS | Status: DC | PRN
  Administered 2016-06-10: 150 mg via INTRAVENOUS

## 2016-06-10 SURGICAL SUPPLY — 51 items
BANDAGE ELASTIC 6 LF NS (GAUZE/BANDAGES/DRESSINGS) ×3 IMPLANT
BLADE SURG 15 STRL SS SAFETY (BLADE) ×6 IMPLANT
BNDG GAUZE 4.5X4.1 6PLY STRL (MISCELLANEOUS) ×3 IMPLANT
BULB RESERV EVAC DRAIN JP 100C (MISCELLANEOUS) IMPLANT
CANISTER SUCT 1200ML W/VALVE (MISCELLANEOUS) ×3 IMPLANT
CHLORAPREP W/TINT 26ML (MISCELLANEOUS) ×3 IMPLANT
CLOSURE WOUND 1/2 X4 (GAUZE/BANDAGES/DRESSINGS) ×1
CNTNR SPEC 2.5X3XGRAD LEK (MISCELLANEOUS) ×1
CONT SPEC 4OZ STER OR WHT (MISCELLANEOUS) ×2
CONTAINER SPEC 2.5X3XGRAD LEK (MISCELLANEOUS) ×1 IMPLANT
COVER PROBE FLX POLY STRL (MISCELLANEOUS) ×3 IMPLANT
DEVICE DUBIN SPECIMEN MAMMOGRA (MISCELLANEOUS) ×3 IMPLANT
DRAIN CHANNEL JP 15F RND 16 (MISCELLANEOUS) IMPLANT
DRAPE LAPAROTOMY TRNSV 106X77 (MISCELLANEOUS) ×3 IMPLANT
DRSG TELFA 3X8 NADH (GAUZE/BANDAGES/DRESSINGS) ×3 IMPLANT
ELECT CAUTERY BLADE TIP 2.5 (TIP) ×3
ELECT REM PT RETURN 9FT ADLT (ELECTROSURGICAL) ×3
ELECTRODE CAUTERY BLDE TIP 2.5 (TIP) ×1 IMPLANT
ELECTRODE REM PT RTRN 9FT ADLT (ELECTROSURGICAL) ×1 IMPLANT
GAUZE FLUFF 18X24 1PLY STRL (GAUZE/BANDAGES/DRESSINGS) ×3 IMPLANT
GAUZE SPONGE 4X4 12PLY STRL (GAUZE/BANDAGES/DRESSINGS) ×3 IMPLANT
GLOVE BIO SURGEON STRL SZ7.5 (GLOVE) ×6 IMPLANT
GLOVE INDICATOR 8.0 STRL GRN (GLOVE) ×6 IMPLANT
GOWN STRL REUS W/ TWL LRG LVL3 (GOWN DISPOSABLE) ×2 IMPLANT
GOWN STRL REUS W/TWL LRG LVL3 (GOWN DISPOSABLE) ×4
HARMONIC SCALPEL FOCUS (MISCELLANEOUS) IMPLANT
KIT RM TURNOVER STRD PROC AR (KITS) ×3 IMPLANT
LABEL OR SOLS (LABEL) ×3 IMPLANT
MARGIN MAP 10MM (MISCELLANEOUS) ×3 IMPLANT
NDL SAFETY 22GX1.5 (NEEDLE) ×3 IMPLANT
NEEDLE HYPO 25X1 1.5 SAFETY (NEEDLE) ×3 IMPLANT
PACK BASIN MINOR ARMC (MISCELLANEOUS) ×3 IMPLANT
SHEARS FOC LG CVD HARMONIC 17C (MISCELLANEOUS) IMPLANT
SLEVE PROBE SENORX GAMMA FIND (MISCELLANEOUS) ×3 IMPLANT
STRIP CLOSURE SKIN 1/2X4 (GAUZE/BANDAGES/DRESSINGS) ×2 IMPLANT
SUT ETHILON 3-0 FS-10 30 BLK (SUTURE) ×3
SUT SILK 2 0 (SUTURE) ×2
SUT SILK 2-0 18XBRD TIE 12 (SUTURE) ×1 IMPLANT
SUT VIC AB 2-0 CT1 27 (SUTURE) ×4
SUT VIC AB 2-0 CT1 TAPERPNT 27 (SUTURE) ×2 IMPLANT
SUT VIC AB 3-0 SH 27 (SUTURE) ×4
SUT VIC AB 3-0 SH 27X BRD (SUTURE) ×2 IMPLANT
SUT VIC AB 4-0 FS2 27 (SUTURE) ×6 IMPLANT
SUT VICRYL+ 3-0 144IN (SUTURE) ×3 IMPLANT
SUTURE EHLN 3-0 FS-10 30 BLK (SUTURE) ×1 IMPLANT
SWABSTK COMLB BENZOIN TINCTURE (MISCELLANEOUS) ×3 IMPLANT
SYR BULB IRRIG 60ML STRL (SYRINGE) ×3 IMPLANT
SYR CONTROL 10ML (SYRINGE) ×3 IMPLANT
SYRINGE 10CC LL (SYRINGE) ×3 IMPLANT
TAPE TRANSPORE STRL 2 31045 (GAUZE/BANDAGES/DRESSINGS) IMPLANT
WATER STERILE IRR 1000ML POUR (IV SOLUTION) ×3 IMPLANT

## 2016-06-10 NOTE — Anesthesia Preprocedure Evaluation (Addendum)
Anesthesia Evaluation  Patient identified by MRN, date of birth, ID band Patient awake    Reviewed: Allergy & Precautions, NPO status , Patient's Chart, lab work & pertinent test results  Airway Mallampati: II  TM Distance: <3 FB    Comment: Short chin Dental  (+) Caps, Chipped   Pulmonary former smoker,    Pulmonary exam normal        Cardiovascular hypertension, Pt. on medications Normal cardiovascular exam     Neuro/Psych negative neurological ROS  negative psych ROS   GI/Hepatic Neg liver ROS, GERD  Medicated and Controlled,  Endo/Other  negative endocrine ROS  Renal/GU negative Renal ROS  negative genitourinary   Musculoskeletal  (+) Arthritis , Osteoarthritis,    Abdominal Normal abdominal exam  (+)   Peds negative pediatric ROS (+)  Hematology  (+) anemia ,   Anesthesia Other Findings Reflux is well controlled.  Reproductive/Obstetrics                            Anesthesia Physical Anesthesia Plan  ASA: II  Anesthesia Plan: General   Post-op Pain Management:    Induction: Intravenous  Airway Management Planned: LMA  Additional Equipment:   Intra-op Plan:   Post-operative Plan: Extubation in OR  Informed Consent: I have reviewed the patients History and Physical, chart, labs and discussed the procedure including the risks, benefits and alternatives for the proposed anesthesia with the patient or authorized representative who has indicated his/her understanding and acceptance.   Dental advisory given  Plan Discussed with: CRNA and Surgeon  Anesthesia Plan Comments:        Anesthesia Quick Evaluation

## 2016-06-10 NOTE — Op Note (Signed)
Preoperative diagnosis: Right breast cancer.  Postoperative diagnosis: Same.  Operative procedure: Wide local excision with wire localization and mastoplasty, sentinel node biopsy.  Operating surgeon: Ollen Bowl, M.D.  Anesthesia: Gen. by LMA, Marcaine 0.5% with 1-200,000 units epinephrine, 30 mL local infiltration.  Estimated blood loss: 5 mL.  Clinical note: This 74 year old woman recently had a biopsy showing an invasive mammary carcinoma. She desired breast conservation. She underwent needle localization and sentinel node injection prior to the procedure.  Operative note: With the patient under adequate general anesthesia 5 mL of 0.5% methylene blue was infiltrated in the subareolar plexus of the right breast after alcohol prep of the skin. The breast chest and axilla was then prepped with ChloraPrep and draped. A roll was placed behind the right shoulder. The arm was supported. Ultrasound was used to identify the tip of the wire in the near central portion of the breast from its location on the periphery of the breast. A curvilinear incision in the 12:00 position was made and carried down through skin subtenon's tissue with hemostasis achieved by electrocautery. A 3 x 4 x 5 cm block of tissue including the pectoralis fascia was excised orientated and sent for specimen radiograph. This showed the biopsy cavity deformity near the inferior aspect of the wound and the tip of the wire intact. The pathologist reported surgical changes from the biopsy near the superior aspect of the incision. It was elected to defer further excision pending permanent sections. While the breast specimen was being processed the gamma finder was used to identify the area of increased uptake in the right axilla. It was found that it was possible to reach this through the wide excision site. 2 hot, blue lymph nodes were removed with complete elimination of further areas of increased uptake in the axilla. These were sent  in formalin for routine histology. The breast was elevated off the underlying pectoralis muscle taking the fascia with the breast tissue. This was done circumferentially and then approximated with interrupted 2-0 Vicryl figure-of-eight sutures to help reconstruct the breast volume. The deep adipose layer, approximately 2 cm below the skin was then approximated with a running 2-0 Vicryl suture. The more superficial fat was approximated in similar fashion. The skin was closed with a running 4-0 Vicryl septic suture. Benzoin and Steri-Strips followed by Telfa pad, fluffed gauze and surgical bra was applied.  The patient tolerated the procedure well was taken to recovery room in stable condition.

## 2016-06-10 NOTE — Anesthesia Procedure Notes (Signed)
Procedure Name: LMA Insertion Date/Time: 06/10/2016 9:39 AM Performed by: Johnna Acosta Pre-anesthesia Checklist: Patient identified, Emergency Drugs available, Suction available, Patient being monitored and Timeout performed Patient Re-evaluated:Patient Re-evaluated prior to inductionOxygen Delivery Method: Circle system utilized Preoxygenation: Pre-oxygenation with 100% oxygen Intubation Type: IV induction LMA: LMA inserted LMA Size: 3.5 Tube type: Oral Number of attempts: 1 Placement Confirmation: positive ETCO2 and breath sounds checked- equal and bilateral Tube secured with: Tape Dental Injury: Teeth and Oropharynx as per pre-operative assessment

## 2016-06-10 NOTE — H&P (Signed)
GYDA BRICKER [id number] Oct 06, 1941     HPI: 3 /y.o female w/ invasive mammary cancer. For wide excision, SLN biopsy. General health remains good.   Prescriptions Prior to Admission  Medication Sig Dispense Refill Last Dose  . atorvastatin (LIPITOR) 20 MG tablet Take 1 tablet (20 mg total) by mouth daily. (Patient taking differently: Take 20 mg by mouth daily at 6 PM. ) 90 tablet 3 06/09/2016 at Unknown time  . Calcium Carbonate-Vitamin D (CALCIUM 600/VITAMIN D) 600-400 MG-UNIT per tablet Take 1 tablet by mouth 2 (two) times daily.    06/09/2016 at Unknown time  . carisoprodol (SOMA) 350 MG tablet TAKE 1 TABLET BY MOUTH AT BEDTIME 90 tablet 1 06/09/2016 at Unknown time  . clobetasol ointment (TEMOVATE) [id number]-123456789 % Apply 1 application topically every other day. (Patient taking differently: Apply 1 application topically as needed. ) 30 g 1 Taking  . loratadine (CLARITIN) 10 MG tablet Take 10 mg by mouth daily.     06/09/2016 at Unknown time  . mometasone (ELOCON) 0.1 % lotion Apply topically daily. 60 mL 1 Taking  . naproxen (NAPROSYN) 500 MG tablet TAKE 1 TABLET TWICE A DAY  WITH MEALS 180 tablet 3 06/09/2016 at Unknown time  . omeprazole (PRILOSEC) 20 MG capsule Take 1 capsule (20 mg total) by mouth daily. (Patient taking differently: Take 20 mg by mouth at bedtime. ) 90 capsule 3 06/10/2016 at 0700  . polyethylene glycol powder (GLYCOLAX/MIRALAX) powder TAKE 1 CAPFUL(17G) BY MOUTH DAILY 1054 g 3 06/09/2016 at Unknown time  . Propylene Glycol (SYSTANE BALANCE OP) Place 1 drop into both eyes daily.     . traMADol-acetaminophen (ULTRACET) 37.5-325 MG tablet TAKE 2 TABLETS BY MOUTH AT BEDTIME 180 tablet 1 06/09/2016 at Unknown time  . traZODone (DESYREL) 100 MG tablet TAKE 1.5 TABLETS(=150MG ) AT BEDTIME (Patient taking differently: Take 150 mg by mouth at bedtime. TAKE 1.5 TABLETS(=150MG ) AT BEDTIME) 135 tablet 3 06/09/2016 at Unknown time  . triamterene-hydrochlorothiazide (DYAZIDE) 37.5-25 MG capsule  TAKE 1 CAPSULE EVERY       MORNING 90 capsule 3 06/09/2016 at Unknown time  . VOLTAREN 1 % GEL APPLY 2G TOPICALLY FOUR TIMES DAILY (Patient taking differently: APPLY 2G TOPICALLY FOUR TIMES DAILY-PRN) 100 g 3 06/07/2016   Allergies  Allergen Reactions  . Codeine     REACTION: Felt like she was going to "pass out"   Past Medical History:  Diagnosis Date  . Arthritis 2011   R hip injection per Dr. Nelva Bush  . Breast cancer ([place]) 04-28-16   INVASIVE DUCTAL CARCINOMA., DCIS  . GERD (gastroesophageal reflux disease)   . History of chickenpox   . Hyperlipidemia   . Hypertension   . Lichen planus   . Osteopenia    Tscore -1, consider repeat in ~2013, per Dr. Amalia Hailey with gyn  . 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 BASAL CELL CARCINOMA EXCISION  01-22-14   chest area. Dr Nehemiah Massed  . BREAST BIOPSY Right 2013   CORE - NEG, cyst with ductal hyperplasia without atypia Dr Bary Castilla  . BREAST BIOPSY Right 04-28-16   Invasive ductal carcinoma  . BREAST CYST ASPIRATION Bilateral   . BREAST EXCISIONAL BIOPSY Right 1968   NEG  . BREAST SURGERY  1968   Fibroadenoma  . CARDIOVASCULAR STRESS TEST  02/07/2002  . [place]  . CHOLECYSTECTOMY  2005   Dr Helane Rima, [place]  .  COLONOSCOPY  04-22-02   Dr Ricarda Frame, [place]  . COLONOSCOPY  09-02-12   Dr Bary Castilla  . Endometrial ablation and right ovary removal  2002  . MRI, hip  01/12/2004  . MRI, neck  06/23/1998  10/31/2003  . OOPHORECTOMY Bilateral 15+ YRS AGO  . Removal of excess eyelids and shortening of eyelid tendons  01/2010  . 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 Right hip injection  03/30/2009  . Touch Up of excess eyelids  04/20/2010   Social History   Social History  . Marital status: Married    Spouse name: N/A  . Number of children: 2  . Years of education: N/A   Occupational History  . Retired since 2007 and moved to [employer] Retired   Social History Main  Topics  . Smoking status: Former Smoker    Types: Cigarettes    Quit date: 11/14/1969  . Smokeless tobacco: Never Used  . Alcohol use Yes     Comment: Rare  . Drug use: No  . Sexual activity: Not Currently    Birth control/ protection: Post-menopausal   Other Topics Concern  . Not on file   Social History Narrative   From [employer]:  [employer] grad   2 kids in [place] cards and likes to sew   Social History   Social History Narrative   From [employer]:  [employer] grad   2 kids in [place] cards and likes to sew     ROS: Negative.     PE: HEENT: Negative. Lungs: Clear. Cardio: RR. Breast: Localizing in place.   Assessment/Plan:  Proceed with planned procedure.   Robert Bellow 06/10/2016

## 2016-06-10 NOTE — OR Nursing (Signed)
Dr Byrnett in to see pt  

## 2016-06-10 NOTE — Discharge Instructions (Signed)

## 2016-06-10 NOTE — Anesthesia Postprocedure Evaluation (Signed)
Anesthesia Post Note  Patient: [place]  Procedure(s) Performed: Procedure(s) (LRB): BREAST LUMPECTOMY WITH SENTINEL LYMPH NODE BX / WITH RECONSTRUCTION (Right) BREAST LUMPECTOMY WITH NEEDLE LOCALIZATION (Right)  Patient location during evaluation: PACU Anesthesia Type: General Level of consciousness: awake and alert and oriented Pain management: pain level controlled Vital Signs Assessment: post-procedure vital signs reviewed and stable Respiratory status: spontaneous breathing Cardiovascular status: blood pressure returned to baseline Anesthetic complications: no    Last Vitals:  Vitals:   06/10/16 1151 06/10/16 1220  BP: (!) 115/43 (!) 130/52  Pulse: (!) 52 70  Resp: 16 16  Temp: (!) 36.1 C     Last Pain:  Vitals:   06/10/16 1220  TempSrc:   PainSc: 4                  Artesha Wemhoff

## 2016-06-10 NOTE — Transfer of Care (Signed)
Immediate Anesthesia Transfer of Care Note  Patient: Alyssa Nolan  Procedure(s) Performed: Procedure(s): BREAST LUMPECTOMY WITH SENTINEL LYMPH NODE BX / WITH RECONSTRUCTION (Right) BREAST LUMPECTOMY WITH NEEDLE LOCALIZATION (Right)  Patient Location: PACU  Anesthesia Type:General  Level of Consciousness: awake and sedated  Airway & Oxygen Therapy: Patient Spontanous Breathing and Patient connected to face mask oxygen  Post-op Assessment: Report given to RN and Post -op Vital signs reviewed and stable  Post vital signs: Reviewed and stable  Last Vitals:  Vitals:   06/10/16 0900 06/10/16 1049  BP: 137/74 (!) 152/74  Pulse: 60 90  Resp: 18 16  Temp: 36.2 C 36.2 C    Last Pain:  Vitals:   06/10/16 1049  TempSrc:   PainSc: Asleep         Complications: No apparent anesthesia complications

## 2016-06-14 ENCOUNTER — Telehealth: Payer: Self-pay | Admitting: General Surgery

## 2016-06-14 LAB — SURGICAL PATHOLOGY

## 2016-06-14 NOTE — Telephone Encounter (Signed)
Notified path was fine. Reports greatest discomfort was the surgical bra.

## 2016-06-15 ENCOUNTER — Encounter: Payer: Self-pay | Admitting: General Surgery

## 2016-06-15 ENCOUNTER — Telehealth: Payer: Self-pay | Admitting: *Deleted

## 2016-06-15 ENCOUNTER — Other Ambulatory Visit (INDEPENDENT_AMBULATORY_CARE_PROVIDER_SITE_OTHER): Payer: Medicare Other

## 2016-06-15 ENCOUNTER — Ambulatory Visit: Payer: Medicare Other | Admitting: General Surgery

## 2016-06-15 VITALS — BP 128/72 | HR 70 | Resp 12 | Ht 64.0 in | Wt 167.0 lb

## 2016-06-15 DIAGNOSIS — C50411 Malignant neoplasm of upper-outer quadrant of right female breast: Secondary | ICD-10-CM

## 2016-06-15 DIAGNOSIS — C50911 Malignant neoplasm of unspecified site of right female breast: Secondary | ICD-10-CM

## 2016-06-15 NOTE — Progress Notes (Signed)
Patient ID: Alyssa Nolan, female   DOB: 13-Aug-1941, 75 y.o.   MRN: [id number]  Chief Complaint  Patient presents with  . Routine Post Op    HPI [name] is a 75 y.o. female here today for her post op right  breast lumpectomy done on 06/10/16. Patient states she is doing well.  HPI  Past Medical History:  Diagnosis Date  . Arthritis 2011   R hip injection per Dr. Nelva Bush  . Breast cancer ([place]) 04-28-16   INVASIVE DUCTAL CARCINOMA., DCIS  . GERD (gastroesophageal reflux disease)   . History of chickenpox   . Hyperlipidemia   . Hypertension   . Lichen planus   . Osteopenia    Tscore -1, consider repeat in ~2013, per Dr. Amalia Hailey with gyn  . 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 BASAL CELL CARCINOMA EXCISION  01-22-14   chest area. Dr Nehemiah Massed  . BREAST BIOPSY Right 2013   CORE - NEG, cyst with ductal hyperplasia without atypia Dr Bary Castilla  . BREAST BIOPSY Right 04-28-16   Invasive ductal carcinoma  . BREAST CYST ASPIRATION Bilateral   . BREAST EXCISIONAL BIOPSY Right 1968   NEG  . BREAST LUMPECTOMY WITH NEEDLE LOCALIZATION Right 06/10/2016   Procedure: BREAST LUMPECTOMY WITH NEEDLE LOCALIZATION;  Surgeon: Robert Bellow, MD;  Location: [facility] ORS;  Service: General;  Laterality: Right;  . BREAST LUMPECTOMY WITH SENTINEL LYMPH NODE BIOPSY Right 06/10/2016   Procedure: BREAST LUMPECTOMY WITH SENTINEL LYMPH NODE BX / WITH RECONSTRUCTION;  Surgeon: Robert Bellow, MD;  Location: [facility] ORS;  Service: General;  Laterality: Right;  . BREAST SURGERY  1968   Fibroadenoma  . CARDIOVASCULAR STRESS TEST  02/07/2002  . [place]  . CHOLECYSTECTOMY  2005   Dr Helane Rima, [place]  . COLONOSCOPY  04-22-02   Dr Ricarda Frame, [place]  . COLONOSCOPY  09-02-12   Dr Bary Castilla  . Endometrial ablation and right ovary removal  2002  . MRI, hip  01/12/2004  . MRI, neck  06/23/1998  10/31/2003  . OOPHORECTOMY Bilateral 15+ YRS AGO  .  Removal of excess eyelids and shortening of eyelid tendons  01/2010  . 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 Right hip injection  03/30/2009  . Touch Up of excess eyelids  04/20/2010    Family History  Problem Relation Age of Onset  . COPD Mother     Smoker  . Heart disease Father     Possible CHF  . Stroke Maternal Grandfather 83    CVA  . Colon cancer      mother's 1/2 sister  . Breast cancer Neg Hx   . Diabetes Neg Hx     Social History Social History  Substance Use Topics  . Smoking status: Former Smoker    Types: Cigarettes    Quit date: 11/14/1969  . Smokeless tobacco: Never Used  . Alcohol use Yes     Comment: Rare    Allergies  Allergen Reactions  . Codeine     REACTION: Felt like she was going to "pass out"    Current Outpatient Prescriptions  Medication Sig Dispense Refill  . atorvastatin (LIPITOR) 20 MG tablet Take 1 tablet (20 mg total) by mouth daily. (Patient taking differently: Take 20 mg by mouth daily at 6 PM. ) 90 tablet 3  . Calcium Carbonate-Vitamin D (CALCIUM  600/VITAMIN D) 600-400 MG-UNIT per tablet Take 1 tablet by mouth 2 (two) times daily.     . carisoprodol (SOMA) 350 MG tablet TAKE 1 TABLET BY MOUTH AT BEDTIME 90 tablet 1  . clobetasol ointment (TEMOVATE) 6.54 % Apply 1 application topically every other day. (Patient taking differently: Apply 1 application topically as needed. ) 30 g 1  . HYDROcodone-acetaminophen (NORCO) 5-325 MG tablet Take 1 tablet by mouth every 6 (six) hours as needed for moderate pain. 30 tablet 0  . loratadine (CLARITIN) 10 MG tablet Take 10 mg by mouth daily.      . mometasone (ELOCON) 0.1 % lotion Apply topically daily. 60 mL 1  . naproxen (NAPROSYN) 500 MG tablet TAKE 1 TABLET TWICE A DAY  WITH MEALS 180 tablet 3  . omeprazole (PRILOSEC) 20 MG capsule Take 1 capsule (20 mg total) by mouth daily. (Patient taking differently: Take 20 mg by mouth at bedtime. ) 90 capsule 3  . polyethylene glycol  powder (GLYCOLAX/MIRALAX) powder TAKE 1 CAPFUL(17G) BY MOUTH DAILY 1054 g 3  . Propylene Glycol (SYSTANE BALANCE OP) Place 1 drop into both eyes daily.    . traMADol-acetaminophen (ULTRACET) 37.5-325 MG tablet TAKE 2 TABLETS BY MOUTH AT BEDTIME 180 tablet 1  . traZODone (DESYREL) 100 MG tablet TAKE 1.5 TABLETS(=150MG) AT BEDTIME (Patient taking differently: Take 150 mg by mouth at bedtime. TAKE 1.5 TABLETS(=150MG) AT BEDTIME) 135 tablet 3  . triamterene-hydrochlorothiazide (DYAZIDE) 37.5-25 MG capsule TAKE 1 CAPSULE EVERY       MORNING 90 capsule 3  . VOLTAREN 1 % GEL APPLY 2G TOPICALLY FOUR TIMES DAILY (Patient taking differently: APPLY 2G TOPICALLY FOUR TIMES DAILY-PRN) 100 g 3   No current facility-administered medications for this visit.     Review of Systems Review of Systems  Constitutional: Negative.   Respiratory: Negative.   Cardiovascular: Negative.     Blood pressure 128/72, pulse 70, resp. rate 12, height 5' 4" (1.626 m), weight 167 lb (75.8 kg).  Physical Exam Physical Exam  Constitutional: She is oriented to person, place, and time. She appears well-developed and well-nourished.  Eyes: Conjunctivae are normal. No scleral icterus.  Neck: Neck supple.  Lymphadenopathy:    She has no cervical adenopathy.  Neurological: She is alert and oriented to person, place, and time.  Skin: Skin is warm and dry.    Data Reviewed Pathology showed a 12 mm, T1c, N0 carcinoma. DCIS at less than 1 mm at the superior margin, well away from the central area of invasive mammary carcinoma. Results: HER2 - NEGATIVE RATIO OF HER2/CEP17 SIGNALS 1.45 AVERAGE HER2 COPY NUMBER PER CELL 2.10 Reference Range: NEGATIVE HER2/CEP17 Ratio <2.0 and average HER2 copy number <4.0 EQUIVOCAL HER2/CEP17 Ratio <2.0 and average HER2 copy number >=4.0 and <6.0 POSITIVE HER2/CEP17 Ratio >=2.0 or <2.0 and average HER2 copy number >=6.0 Enid Cutter MD Pathologist, Electronic Signature ( Signed  05/06/2016) PROGNOSTIC INDICATORS Results: IMMUNOHISTOCHEMICAL AND MORPHOMETRIC ANALYSIS PERFORMED MANUALLY Estrogen Receptor: 100%, POSITIVE, STRONG STAINING INTENSITY Progesterone Receptor: 95%, POSITIVE, STRONG STAINING INTENSITY Proliferation Marker Ki67: 5%  Ultrasound examination was completed to determine if the patient was a candidate for accelerated partial breast radiation. Examination shows a little fairly well-defined seroma cavity measuring 1.1 x 1.7 x 2.7 cm. This is at a minimal distance of 1.7 cm from the skin.   Assessment    Doing well status post wide excision. DCIS margin is less than 1 mm, ideal 2 mm. unlikely to benefit from reexcision.  Plan    We'll obtain a post procedure mammogram to determine if extensive microcalcifications remain (not identified in wide excision specimen    Patient to return in one month.  Discussed mammosite with patient. Orders and information faxed to [place]. Patent o be scheduled to see Dr. Baruch Gouty.  This information has been scribed by Gaspar Cola CMA.    Robert Bellow 06/15/2016, 1:55 PM

## 2016-06-15 NOTE — Patient Instructions (Addendum)
Patient to return in one month. 

## 2016-06-15 NOTE — Telephone Encounter (Signed)
-----   Message from Robert Bellow, MD sent at 06/15/2016  1:53 PM EDT ----- Please notify the patient that I forgot to tell her I would like her to get a post surgery mammogram when she thinks she would be comfortable with moderate compression of the breast. This can be any time before her next visit. This will help me in my treatment planning and further recommendations.

## 2016-06-15 NOTE — Telephone Encounter (Signed)
Notified patient as instructed, patient pleased. Discussed follow-up appointments, patient agrees  

## 2016-06-16 ENCOUNTER — Encounter: Payer: Self-pay | Admitting: General Surgery

## 2016-06-17 ENCOUNTER — Encounter: Payer: Self-pay | Admitting: Radiation Oncology

## 2016-06-17 ENCOUNTER — Ambulatory Visit
Admission: RE | Admit: 2016-06-17 | Discharge: 2016-06-17 | Disposition: A | Discharge status: Home / Self Care | Payer: Medicare Other | Source: Ambulatory Visit | Attending: Radiation Oncology | Admitting: Radiation Oncology

## 2016-06-17 VITALS — BP 134/76 | HR 70 | Temp 97.5°F | Wt 168.8 lb

## 2016-06-17 DIAGNOSIS — Z923 Personal history of irradiation: Secondary | ICD-10-CM | POA: Diagnosis not present

## 2016-06-17 DIAGNOSIS — D0511 Intraductal carcinoma in situ of right breast: Secondary | ICD-10-CM | POA: Insufficient documentation

## 2016-06-17 DIAGNOSIS — C50411 Malignant neoplasm of upper-outer quadrant of right female breast: Secondary | ICD-10-CM

## 2016-06-17 DIAGNOSIS — Z170 Estrogen receptor positive status [ER+]: Secondary | ICD-10-CM | POA: Diagnosis not present

## 2016-06-17 DIAGNOSIS — Z79811 Long term (current) use of aromatase inhibitors: Secondary | ICD-10-CM | POA: Insufficient documentation

## 2016-06-17 NOTE — Consult Note (Signed)
Except an outstanding is perfect of Radiation Oncology NEW PATIENT EVALUATION  Name: Alyssa Nolan  MRN: [id number]  Date:   06/17/2016     DOB: 08-29-1941   This 75 y.o. 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  REFERRING PHYSICIAN: Tonia Ghent, MD  CHIEF COMPLAINT:  Chief Complaint  Patient presents with  . Breast Cancer    DIAGNOSIS: The encounter diagnosis was Malignant neoplasm of upper-outer quadrant of right female breast ([place]).   PREVIOUS INVESTIGATIONS:  Mammograms and ultrasound reviewed Pathology reports reviewed Clinical notes reviewed  HPI: Patient is a 75 year old female who presented with an abnormal mammogram the right breast showing a an area of architectural distortion in the upper outer right breast posteriorly confirmed on ultrasound. Stereotactic core needle biopsy was performed showing ductal carcinoma in situ. At the time of wide local excision and sentinel node biopsy tumor was 1.2 cm with margins clear at 1 mm. Tumor was overall grade 12 sentinel lymph nodes were negative for metastatic disease. Tumor was ER/PR positive HER-2/neu not overexpressed. She has done well postoperatively. Treatment options have been discussed with surgeon and she is seen today for radiation oncology opinion. She specifically denies breast tenderness cough or bone pain.  PLANNED TREATMENT REGIMEN: Accelerated partial breast irradiation  PAST MEDICAL HISTORY:  has a past medical history of Arthritis (2011); Breast cancer ([place]) (04-28-16); GERD (gastroesophageal reflux disease); History of chickenpox; Hyperlipidemia; Hypertension; Lichen planus; Osteopenia; and Skin cancer.    PAST SURGICAL HISTORY:  Past Surgical History:  Procedure Laterality Date  . BASAL CELL CARCINOMA EXCISION  01-22-14   chest area. Dr Nehemiah Massed  . BREAST BIOPSY Right  2013   CORE - NEG, cyst with ductal hyperplasia without atypia Dr Bary Castilla  . BREAST BIOPSY Right 04-28-16   Invasive ductal carcinoma  . BREAST CYST ASPIRATION Bilateral   . BREAST EXCISIONAL BIOPSY Right 1968   NEG  . BREAST LUMPECTOMY WITH NEEDLE LOCALIZATION Right 06/10/2016   Procedure: BREAST LUMPECTOMY WITH NEEDLE LOCALIZATION;  Surgeon: Robert Bellow, MD;  Location: [facility] ORS;  Service: General;  Laterality: Right;  . BREAST LUMPECTOMY WITH SENTINEL LYMPH NODE BIOPSY Right 06/10/2016   Procedure: BREAST LUMPECTOMY WITH SENTINEL LYMPH NODE BX / WITH RECONSTRUCTION;  Surgeon: Robert Bellow, MD;  Location: [facility] ORS;  Service: General;  Laterality: Right;  . BREAST SURGERY  1968   Fibroadenoma  . CARDIOVASCULAR STRESS TEST  02/07/2002  . [place]  . CHOLECYSTECTOMY  2005   Dr Helane Rima, [place]  . COLONOSCOPY  04-22-02   Dr Ricarda Frame, [place]  . COLONOSCOPY  09-02-12   Dr Bary Castilla  . Endometrial ablation and right ovary removal  2002  . MRI, hip  01/12/2004  . MRI, neck  06/23/1998  10/31/2003  . OOPHORECTOMY Bilateral 15+ YRS AGO  . Removal of excess eyelids and shortening of eyelid tendons  01/2010  . 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 Right hip injection  03/30/2009  . Touch Up of excess eyelids  04/20/2010    FAMILY HISTORY: family history includes COPD in her mother; Heart disease in her father; Stroke (age of onset: 82) in her maternal grandfather.  SOCIAL HISTORY:  reports that she quit smoking about 46 years ago. Her smoking use included Cigarettes. She has never used smokeless tobacco. She reports  that she drinks alcohol. She reports that she does not use drugs.  ALLERGIES: Codeine  MEDICATIONS:  Current Outpatient Prescriptions  Medication Sig Dispense Refill  . atorvastatin (LIPITOR) 20 MG tablet Take 1 tablet (20 mg total) by mouth daily. (Patient taking differently: Take 20 mg by mouth daily at 6 PM. ) 90 tablet 3  . Calcium  Carbonate-Vitamin D (CALCIUM 600/VITAMIN D) 600-400 MG-UNIT per tablet Take 1 tablet by mouth 2 (two) times daily.     . carisoprodol (SOMA) 350 MG tablet TAKE 1 TABLET BY MOUTH AT BEDTIME 90 tablet 1  . clobetasol ointment (TEMOVATE) 1.77 % Apply 1 application topically every other day. (Patient taking differently: Apply 1 application topically as needed. ) 30 g 1  . HYDROcodone-acetaminophen (NORCO) 5-325 MG tablet Take 1 tablet by mouth every 6 (six) hours as needed for moderate pain. 30 tablet 0  . loratadine (CLARITIN) 10 MG tablet Take 10 mg by mouth daily.      . mometasone (ELOCON) 0.1 % lotion Apply topically daily. 60 mL 1  . naproxen (NAPROSYN) 500 MG tablet TAKE 1 TABLET TWICE A DAY  WITH MEALS 180 tablet 3  . omeprazole (PRILOSEC) 20 MG capsule Take 1 capsule (20 mg total) by mouth daily. (Patient taking differently: Take 20 mg by mouth at bedtime. ) 90 capsule 3  . polyethylene glycol powder (GLYCOLAX/MIRALAX) powder TAKE 1 CAPFUL(17G) BY MOUTH DAILY 1054 g 3  . Propylene Glycol (SYSTANE BALANCE OP) Place 1 drop into both eyes daily.    . traMADol-acetaminophen (ULTRACET) 37.5-325 MG tablet TAKE 2 TABLETS BY MOUTH AT BEDTIME 180 tablet 1  . traZODone (DESYREL) 100 MG tablet TAKE 1.5 TABLETS(=150MG) AT BEDTIME (Patient taking differently: Take 150 mg by mouth at bedtime. TAKE 1.5 TABLETS(=150MG) AT BEDTIME) 135 tablet 3  . triamterene-hydrochlorothiazide (DYAZIDE) 37.5-25 MG capsule TAKE 1 CAPSULE EVERY       MORNING 90 capsule 3  . VOLTAREN 1 % GEL APPLY 2G TOPICALLY FOUR TIMES DAILY (Patient taking differently: APPLY 2G TOPICALLY FOUR TIMES DAILY-PRN) 100 g 3   No current facility-administered medications for this encounter.     ECOG PERFORMANCE STATUS:  0 - Asymptomatic  REVIEW OF SYSTEMS:  Patient denies any weight loss, fatigue, weakness, fever, chills or night sweats. Patient denies any loss of vision, blurred vision. Patient denies any ringing  of the ears or hearing loss. No  irregular heartbeat. Patient denies heart murmur or history of fainting. Patient denies any chest pain or pain radiating to her upper extremities. Patient denies any shortness of breath, difficulty breathing at night, cough or hemoptysis. Patient denies any swelling in the lower legs. Patient denies any nausea vomiting, vomiting of blood, or coffee ground material in the vomitus. Patient denies any stomach pain. Patient states has had normal bowel movements no significant constipation or diarrhea. Patient denies any dysuria, hematuria or significant nocturia. Patient denies any problems walking, swelling in the joints or loss of balance. Patient denies any skin changes, loss of hair or loss of weight. Patient denies any excessive worrying or anxiety or significant depression. Patient denies any problems with insomnia. Patient denies excessive thirst, polyuria, polydipsia. Patient denies any swollen glands, patient denies easy bruising or easy bleeding. Patient denies any recent infections, allergies or URI. Patient "s visual fields have not changed significantly in recent time.    PHYSICAL EXAM: BP 134/76   Pulse 70   Temp 97.5 F (36.4 C)   Wt 168 lb 12.2 oz (76.5 kg)  BMI 28.97 kg/m  She status post wide local excision in the upper outer quadrant of the right breast with incision healing well. No dominant mass or nodularity is noted in either breast in 2 positions examined. No axillary or supraclavicular adenopathy is appreciated. Well-developed well-nourished patient in NAD. HEENT reveals PERLA, EOMI, discs not visualized.  Oral cavity is clear. No oral mucosal lesions are identified. Neck is clear without evidence of cervical or supraclavicular adenopathy. Lungs are clear to A&P. Cardiac examination is essentially unremarkable with regular rate and rhythm without murmur rub or thrill. Abdomen is benign with no organomegaly or masses noted. Motor sensory and DTR levels are equal and symmetric in the  upper and lower extremities. Cranial nerves II through XII are grossly intact. Proprioception is intact. No peripheral adenopathy or edema is identified. No motor or sensory levels are noted. Crude visual fields are within normal range.  LABORATORY DATA: Pathology reports reviewed    RADIOLOGY RESULTS: Mammograms and ultrasound reviewed post op mammograms have been has been ordered   IMPRESSION: Stage 0 (Tis N0 M0) ductal carcinoma in situ ER/PR positive status post wide local excision and sentinel node biopsy of the right breast in 75 year old female  PLAN: At this time we'll wait her follow-up O stop mammogram. I believe the patient will be a good candidate for accelerated partial breast radiation to her right breast. After MammoSite catheter was placed and approved to be acceptable would go ahead with 3400 cGy in 10 fractions at 340 C twice a day. Risks and benefits of treatment including thickening of the lumpectomy site, fatigue, possible small area of skin change all were discussed in detail with the patient. Also that if catheter cannot be placed would go ahead with whole breast radiation. Patient elected to wait another week or 2 before she has her postop mammogram we will start coordinating her MammoSite catheter placement with surgeon. Patient also will be candidate for tamoxifen therapy after completion of radiation.  I would like to take this opportunity to thank you for allowing me to participate in the care of your patient.Armstead Peaks., MD

## 2016-06-24 ENCOUNTER — Encounter: Payer: Self-pay | Admitting: Radiology

## 2016-06-24 ENCOUNTER — Encounter: Payer: Self-pay | Admitting: General Surgery

## 2016-06-24 DIAGNOSIS — C50911 Malignant neoplasm of unspecified site of right female breast: Secondary | ICD-10-CM | POA: Diagnosis not present

## 2016-06-28 ENCOUNTER — Other Ambulatory Visit: Payer: Self-pay | Admitting: General Surgery

## 2016-06-28 DIAGNOSIS — Z1231 Encounter for screening mammogram for malignant neoplasm of breast: Secondary | ICD-10-CM

## 2016-06-28 DIAGNOSIS — C50411 Malignant neoplasm of upper-outer quadrant of right female breast: Secondary | ICD-10-CM

## 2016-06-29 ENCOUNTER — Telehealth: Payer: Self-pay | Admitting: *Deleted

## 2016-06-29 MED ORDER — CEFADROXIL 500 MG PO CAPS: 500.0000 mg | ORAL_CAPSULE | Freq: Two times a day (BID) | ORAL | 0 refills | Status: DC

## 2016-06-29 NOTE — Telephone Encounter (Signed)
Mammosite schedule reviewed with the patient Placement 07-19-16           at [facility] at 3:00 Scan 07-21-16 Treat 07-22-16, 9-11 to 9-14 Aware the [place] will be calling her for more details Aware of ATB and directions reviewed. Aware no showers and to wear her bra while mammosite in place. Pt agrees.

## 2016-07-01 ENCOUNTER — Ambulatory Visit: Payer: Medicare Other

## 2016-07-02 ENCOUNTER — Telehealth: Payer: Self-pay | Admitting: General Surgery

## 2016-07-02 NOTE — Telephone Encounter (Signed)
The patient was notified that her Mammoprint results showed her to be at low risk for recurrent disease with little expected benefit from adjuvant chemotherapy. She will follow-up as previously planned.

## 2016-07-05 ENCOUNTER — Encounter: Payer: Self-pay | Admitting: General Surgery

## 2016-07-06 ENCOUNTER — Encounter: Payer: Self-pay | Admitting: General Surgery

## 2016-07-11 ENCOUNTER — Other Ambulatory Visit: Payer: Self-pay | Admitting: Family Medicine

## 2016-07-11 NOTE — Telephone Encounter (Signed)
Received refill electronically Last refill 01/27/16 #90/1 refill Last office visit 10/20/15

## 2016-07-12 NOTE — Telephone Encounter (Signed)
Please call in.  Thanks.   

## 2016-07-12 NOTE — Telephone Encounter (Signed)
Medication phoned to pharmacy.  

## 2016-07-13 ENCOUNTER — Ambulatory Visit
Admission: RE | Admit: 2016-07-13 | Discharge: 2016-07-13 | Disposition: A | Discharge status: Home / Self Care | Payer: Medicare Other | Source: Ambulatory Visit | Attending: General Surgery | Admitting: General Surgery

## 2016-07-13 DIAGNOSIS — Z1231 Encounter for screening mammogram for malignant neoplasm of breast: Secondary | ICD-10-CM | POA: Insufficient documentation

## 2016-07-19 ENCOUNTER — Ambulatory Visit (INDEPENDENT_AMBULATORY_CARE_PROVIDER_SITE_OTHER): Payer: Medicare Other | Admitting: General Surgery

## 2016-07-19 ENCOUNTER — Other Ambulatory Visit: Payer: Self-pay

## 2016-07-19 ENCOUNTER — Ambulatory Visit: Payer: Medicare Other | Admitting: General Surgery

## 2016-07-19 VITALS — BP 126/72 | HR 74 | Resp 12 | Ht 64.0 in | Wt 168.0 lb

## 2016-07-19 DIAGNOSIS — C50411 Malignant neoplasm of upper-outer quadrant of right female breast: Secondary | ICD-10-CM

## 2016-07-19 NOTE — Progress Notes (Signed)
Patient ID: Alyssa Nolan, female   DOB: [birth year], 75 y.o.   MRN: [id number]  Chief Complaint  Patient presents with  . Procedure    mammosite    HPI Alyssa Nolan is a 75 y.o. female.  Here today for right mammosite. She is doing well.  HPI  Past Medical History:  Diagnosis Date  . Arthritis 2011   R hip injection per Dr. Nelva Bush  . Breast cancer ([place]) 04-28-16   INVASIVE DUCTAL CARCINOMA., DCIS  . GERD (gastroesophageal reflux disease)   . History of chickenpox   . Hyperlipidemia   . Hypertension   . Lichen planus   . Osteopenia    Tscore -1, consider repeat in ~2013, per Dr. Amalia Hailey with gyn  . 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 BASAL CELL CARCINOMA EXCISION  01-22-14   chest area. Dr Nehemiah Massed  . BREAST BIOPSY Right 2013   CORE - NEG, cyst with ductal hyperplasia without atypia Dr Bary Castilla  . BREAST BIOPSY Right 04-28-16   Invasive ductal carcinoma  . BREAST CYST ASPIRATION Bilateral   . BREAST EXCISIONAL BIOPSY Right 1968   NEG  . BREAST LUMPECTOMY WITH NEEDLE LOCALIZATION Right 06/10/2016   Procedure: BREAST LUMPECTOMY WITH NEEDLE LOCALIZATION;  Surgeon: Robert Bellow, MD;  Location: [facility] ORS;  Service: General;  Laterality: Right;  . BREAST LUMPECTOMY WITH SENTINEL LYMPH NODE BIOPSY Right 06/10/2016   Procedure: BREAST LUMPECTOMY WITH SENTINEL LYMPH NODE BX / WITH RECONSTRUCTION;  Surgeon: Robert Bellow, MD;  Location: [facility];  Service: General;  Laterality: Right;  . BREAST SURGERY  1968   Fibroadenoma  . CARDIOVASCULAR STRESS TEST  02/07/2002  . [place]  . CHOLECYSTECTOMY  2005   Dr Helane Rima, [place]  . COLONOSCOPY  04-22-02   Dr Ricarda Frame, [place]  . COLONOSCOPY  09-02-12   Dr Bary Castilla  . Endometrial ablation and right ovary removal  2002  . MRI, hip  01/12/2004  . MRI, neck  06/23/1998  10/31/2003  . OOPHORECTOMY Bilateral 15+ YRS AGO  . Removal of excess eyelids and shortening of  eyelid tendons  01/2010  . 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 Right hip injection  03/30/2009  . Touch Up of excess eyelids  04/20/2010    Family History  Problem Relation Age of Onset  . COPD Mother     Smoker  . Heart disease Father     Possible CHF  . Stroke Maternal Grandfather 83    CVA  . Colon cancer      mother's 1/2 sister  . Breast cancer Neg Hx   . Diabetes Neg Hx     Social History Social History  Substance Use Topics  . Smoking status: Former Smoker    Types: Cigarettes    Quit date: 11/14/1969  . Smokeless tobacco: Never Used  . Alcohol use Yes     Comment: Rare    Allergies  Allergen Reactions  . Codeine     REACTION: Felt like she was going to "pass out"    Current Outpatient Prescriptions  Medication Sig Dispense Refill  . atorvastatin (LIPITOR) 20 MG tablet Take 1 tablet (20 mg total) by mouth daily. (Patient taking differently: Take 20 mg by mouth daily at 6 PM. ) 90 tablet 3  . Calcium Carbonate-Vitamin D (CALCIUM 600/VITAMIN D) 600-400 MG-UNIT per tablet Take  1 tablet by mouth 2 (two) times daily.     . carisoprodol (SOMA) 350 MG tablet TAKE 1 TABLET BY MOUTH AT BEDTIME 90 tablet 1  . cefadroxil (DURICEF) 500 MG capsule Take 1 capsule (500 mg total) by mouth 2 (two) times daily. Start one hour before office procedure on 07-19-16 20 capsule 0  . clobetasol ointment (TEMOVATE) [id number] % Apply 1 application topically every other day. (Patient taking differently: Apply 1 application topically as needed. ) 30 g 1  . HYDROcodone-acetaminophen (NORCO) 5-325 MG tablet Take 1 tablet by mouth every 6 (six) hours as needed for moderate pain. 30 tablet 0  . loratadine (CLARITIN) 10 MG tablet Take 10 mg by mouth daily.      . mometasone (ELOCON) 0.1 % lotion Apply topically daily. 60 mL 1  . naproxen (NAPROSYN) 500 MG tablet TAKE 1 TABLET TWICE A DAY  WITH MEALS 180 tablet 3  . omeprazole (PRILOSEC) 20 MG capsule Take 1 capsule (20 mg total)  by mouth daily. (Patient taking differently: Take 20 mg by mouth at bedtime. ) 90 capsule 3  . polyethylene glycol powder (GLYCOLAX/MIRALAX) powder TAKE 1 CAPFUL(17G) BY MOUTH DAILY 1054 g 3  . Propylene Glycol (SYSTANE BALANCE OP) Place 1 drop into both eyes daily.    . traMADol-acetaminophen (ULTRACET) 37.5-325 MG tablet TAKE 2 TABLETS BY MOUTH AT BEDTIME 180 tablet 1  . traZODone (DESYREL) 100 MG tablet TAKE 1.5 TABLETS(=150MG ) AT BEDTIME (Patient taking differently: Take 150 mg by mouth at bedtime. TAKE 1.5 TABLETS(=150MG ) AT BEDTIME) 135 tablet 3  . triamterene-hydrochlorothiazide (DYAZIDE) 37.5-25 MG capsule TAKE 1 CAPSULE EVERY       MORNING 90 capsule 3  . VOLTAREN 1 % GEL APPLY 2G TOPICALLY FOUR TIMES DAILY (Patient taking differently: APPLY 2G TOPICALLY FOUR TIMES DAILY-PRN) 100 g 3   No current facility-administered medications for this visit.     Review of Systems Review of Systems  Constitutional: Negative.   Respiratory: Negative.   Cardiovascular: Negative.     Blood pressure 126/72, pulse 74, resp. rate 12, height 5\' 4"  (1.626 m), weight 168 lb (76.2 kg).  Physical Exam Physical Exam  Pulmonary/Chest:      Data Reviewed Radiation oncology notes of 06/17/2016.  Assessment    Candidate for accelerated partial breast radiation.    Plan    The procedure was reviewed and the patient was amenable to proceed.  Alcohol was used to prep the skin followed by 10 mL of 0.5% Xylocaine with 0.25% Marcaine with 1-200,000 of epinephrine.  ChloraPrep was applied to the skin. A 8 mm incision was made and the 8 mm trocar placed into the cavity well identified on ultrasound. This measured 1.4 x 2.1 x 2.7 cm in diameter and was at least 2.21 cm below the skin.  Clear serous fluid was drained without incident. A cavity evaluation device was inflated to 35 cm and the minimal distance from the balloon to the skin was 1.56 cm. The cavity evaluation device was removed and placed with  a treatment balloon inflated to 35 mL with a mix of saline and Omnipaque. This showed spherical insufflation and a minimum distance of 1.52 cm to the overlying skin. Bacitracin ointment was applied to the balloon exit site followed by dry dressing.  The patient experienced mild discomfort during initial inflation of the cavity evaluation device but none with the treatment balloon insufflation. The procedure was well tolerated.  Post implantation instructions were reviewed. The patient will follow-up in 2  weeks to review initiation of antiestrogen therapy.       This information has been scribed by Karie Fetch RN, BSN,BC.  Robert Bellow 07/19/2016, 9:10 PM

## 2016-07-19 NOTE — Patient Instructions (Signed)
The patient is aware to call back for any questions or concerns. Patient care kit given to patient.  Instructed no showers, sponge bath while mammosite in place, take antibiotic. Follow up with [place] as arranged. Discussed wearing your bra for support at all times.

## 2016-07-21 ENCOUNTER — Encounter: Payer: Self-pay | Admitting: Family Medicine

## 2016-07-21 ENCOUNTER — Ambulatory Visit
Admission: RE | Admit: 2016-07-21 | Discharge: 2016-07-21 | Disposition: A | Discharge status: Home / Self Care | Payer: Medicare Other | Source: Ambulatory Visit | Attending: Radiation Oncology | Admitting: Radiation Oncology

## 2016-07-21 DIAGNOSIS — Z79811 Long term (current) use of aromatase inhibitors: Secondary | ICD-10-CM | POA: Diagnosis not present

## 2016-07-21 DIAGNOSIS — D0511 Intraductal carcinoma in situ of right breast: Secondary | ICD-10-CM | POA: Diagnosis not present

## 2016-07-21 DIAGNOSIS — Z923 Personal history of irradiation: Secondary | ICD-10-CM | POA: Diagnosis not present

## 2016-07-21 DIAGNOSIS — Z170 Estrogen receptor positive status [ER+]: Secondary | ICD-10-CM | POA: Diagnosis not present

## 2016-07-22 ENCOUNTER — Ambulatory Visit
Admission: RE | Admit: 2016-07-22 | Discharge: 2016-07-22 | Disposition: A | Discharge status: Home / Self Care | Payer: Medicare Other | Source: Ambulatory Visit | Attending: Radiation Oncology | Admitting: Radiation Oncology

## 2016-07-22 DIAGNOSIS — D0511 Intraductal carcinoma in situ of right breast: Secondary | ICD-10-CM | POA: Diagnosis not present

## 2016-07-22 DIAGNOSIS — Z923 Personal history of irradiation: Secondary | ICD-10-CM | POA: Diagnosis not present

## 2016-07-22 DIAGNOSIS — Z170 Estrogen receptor positive status [ER+]: Secondary | ICD-10-CM | POA: Diagnosis not present

## 2016-07-22 DIAGNOSIS — Z79811 Long term (current) use of aromatase inhibitors: Secondary | ICD-10-CM | POA: Diagnosis not present

## 2016-07-25 ENCOUNTER — Ambulatory Visit: Payer: Medicare Other

## 2016-07-25 ENCOUNTER — Ambulatory Visit
Admission: RE | Admit: 2016-07-25 | Discharge: 2016-07-25 | Disposition: A | Discharge status: Home / Self Care | Payer: Medicare Other | Source: Ambulatory Visit | Attending: Radiation Oncology | Admitting: Radiation Oncology

## 2016-07-25 DIAGNOSIS — Z923 Personal history of irradiation: Secondary | ICD-10-CM | POA: Diagnosis not present

## 2016-07-25 DIAGNOSIS — D0511 Intraductal carcinoma in situ of right breast: Secondary | ICD-10-CM | POA: Diagnosis not present

## 2016-07-25 DIAGNOSIS — Z170 Estrogen receptor positive status [ER+]: Secondary | ICD-10-CM | POA: Diagnosis not present

## 2016-07-25 DIAGNOSIS — Z79811 Long term (current) use of aromatase inhibitors: Secondary | ICD-10-CM | POA: Diagnosis not present

## 2016-07-26 ENCOUNTER — Ambulatory Visit
Admission: RE | Admit: 2016-07-26 | Discharge: 2016-07-26 | Disposition: A | Discharge status: Home / Self Care | Payer: Medicare Other | Source: Ambulatory Visit | Attending: Radiation Oncology | Admitting: Radiation Oncology

## 2016-07-26 ENCOUNTER — Ambulatory Visit: Payer: Medicare Other

## 2016-07-26 DIAGNOSIS — D0511 Intraductal carcinoma in situ of right breast: Secondary | ICD-10-CM | POA: Diagnosis not present

## 2016-07-26 DIAGNOSIS — Z79811 Long term (current) use of aromatase inhibitors: Secondary | ICD-10-CM | POA: Diagnosis not present

## 2016-07-26 DIAGNOSIS — Z923 Personal history of irradiation: Secondary | ICD-10-CM | POA: Diagnosis not present

## 2016-07-26 DIAGNOSIS — Z170 Estrogen receptor positive status [ER+]: Secondary | ICD-10-CM | POA: Diagnosis not present

## 2016-07-27 ENCOUNTER — Ambulatory Visit
Admission: RE | Admit: 2016-07-27 | Discharge: 2016-07-27 | Disposition: A | Discharge status: Home / Self Care | Payer: Medicare Other | Source: Ambulatory Visit | Attending: Radiation Oncology | Admitting: Radiation Oncology

## 2016-07-27 DIAGNOSIS — Z79811 Long term (current) use of aromatase inhibitors: Secondary | ICD-10-CM | POA: Diagnosis not present

## 2016-07-27 DIAGNOSIS — Z923 Personal history of irradiation: Secondary | ICD-10-CM | POA: Diagnosis not present

## 2016-07-27 DIAGNOSIS — Z170 Estrogen receptor positive status [ER+]: Secondary | ICD-10-CM | POA: Diagnosis not present

## 2016-07-27 DIAGNOSIS — D0511 Intraductal carcinoma in situ of right breast: Secondary | ICD-10-CM | POA: Diagnosis not present

## 2016-07-28 ENCOUNTER — Ambulatory Visit
Admission: RE | Admit: 2016-07-28 | Discharge: 2016-07-28 | Disposition: A | Discharge status: Home / Self Care | Payer: Medicare Other | Source: Ambulatory Visit | Attending: Radiation Oncology | Admitting: Radiation Oncology

## 2016-07-28 DIAGNOSIS — Z170 Estrogen receptor positive status [ER+]: Secondary | ICD-10-CM | POA: Diagnosis not present

## 2016-07-28 DIAGNOSIS — Z923 Personal history of irradiation: Secondary | ICD-10-CM | POA: Diagnosis not present

## 2016-07-28 DIAGNOSIS — Z79811 Long term (current) use of aromatase inhibitors: Secondary | ICD-10-CM | POA: Diagnosis not present

## 2016-07-28 DIAGNOSIS — D0511 Intraductal carcinoma in situ of right breast: Secondary | ICD-10-CM | POA: Diagnosis not present

## 2016-07-30 ENCOUNTER — Other Ambulatory Visit: Payer: Self-pay | Admitting: Family Medicine

## 2016-08-01 ENCOUNTER — Encounter: Payer: Self-pay | Admitting: General Surgery

## 2016-08-01 ENCOUNTER — Ambulatory Visit (INDEPENDENT_AMBULATORY_CARE_PROVIDER_SITE_OTHER): Payer: Medicare Other | Admitting: General Surgery

## 2016-08-01 VITALS — BP 118/64 | HR 74 | Resp 12 | Ht 67.0 in | Wt 167.0 lb

## 2016-08-01 DIAGNOSIS — M8580 Other specified disorders of bone density and structure, unspecified site: Secondary | ICD-10-CM

## 2016-08-01 DIAGNOSIS — C50411 Malignant neoplasm of upper-outer quadrant of right female breast: Secondary | ICD-10-CM

## 2016-08-01 MED ORDER — LETROZOLE 2.5 MG PO TABS: 2.5000 mg | ORAL_TABLET | Freq: Every day | ORAL | 3 refills | Status: DC

## 2016-08-01 NOTE — Progress Notes (Signed)
Patient ID: Alyssa Nolan, female   DOB: [birth year], 75 y.o.   MRN: [id number]  Chief Complaint  Patient presents with  . Follow-up    mammosite    HPI Alyssa Nolan is a 75 y.o. female here today for her follow up mammosite placement.     HPI  Past Medical History:  Diagnosis Date  . Arthritis 2011   R hip injection per Dr. Nelva Bush  . Breast cancer ([place]) 04/28/2016   T1c, N0; ER/PR positive, HER-2/neu negative. INVASIVE DUCTAL CARCINOMA., DCIS  . GERD (gastroesophageal reflux disease)   . History of chickenpox   . Hyperlipidemia   . Hypertension   . Lichen planus   . Osteopenia    Tscore -1, consider repeat in ~2013, per Dr. Amalia Hailey with gyn  . 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 BASAL CELL CARCINOMA EXCISION  01-22-14   chest area. Dr Nehemiah Massed  . BREAST BIOPSY Right 2013   CORE - NEG, cyst with ductal hyperplasia without atypia Dr Bary Castilla  . BREAST BIOPSY Right 04-28-16   Invasive ductal carcinoma  . BREAST CYST ASPIRATION Bilateral   . BREAST EXCISIONAL BIOPSY Right 1968   NEG  . BREAST LUMPECTOMY WITH NEEDLE LOCALIZATION Right 06/10/2016   Procedure: BREAST LUMPECTOMY WITH NEEDLE LOCALIZATION;  Surgeon: Robert Bellow, MD;  Location: [facility] OR[facility];  Service: General;  Laterality: Right;  . BREAST LUMPECTOMY WITH SENTINEL LYMPH NODE BIOPSY Right 06/10/2016   Procedure: BREAST LUMPECTOMY WITH SENTINEL LYMPH NODE BX / WITH RECONSTRUCTION;  Surgeon: Robert Bellow, MD;  Location: [facility] OR[facility];  Service: General;  Laterality: Right;  . BREAST SURGERY  1968   Fibroadenoma  . CARDIOVASCULAR STRESS TEST  02/07/2002  . [place]  . CHOLECYSTECTOMY  2005   Dr Helane Rima, [place]  . COLONOSCOPY  04-22-02   Dr Ricarda Frame, [place]  . COLONOSCOPY  09-02-12   Dr Bary Castilla  . Endometrial ablation and right ovary removal  2002  . MRI, hip  01/12/2004  . MRI, neck  06/23/1998  10/31/2003  . OOPHORECTOMY Bilateral 15+ YRS  [facility]  . Removal of excess eyelids and shortening of eyelid tendons  01/2010  . 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 Right hip injection  03/30/2009  . Touch Up of excess eyelids  04/20/2010    Family History  Problem Relation Age of Onset  . COPD Mother     Smoker  . Heart disease Father     Possible CHF  . Stroke Maternal Grandfather 83    CVA  . Colon cancer      mother's 1/2 sister  . Breast cancer Neg Hx   . Diabetes Neg Hx     Social History Social History  Substance Use Topics  . Smoking status: Former Smoker    Types: Cigarettes    Quit date: 11/14/1969  . Smokeless tobacco: Never Used  . Alcohol use Yes     Comment: Rare    Allergies  Allergen Reactions  . Codeine     REACTION: Felt like she was going to "pass out"    Current Outpatient Prescriptions  Medication Sig Dispense Refill  . atorvastatin (LIPITOR) 20 MG tablet Take 1 tablet (20 mg total) by mouth daily. (Patient taking differently: Take 20 mg by mouth daily at 6 PM. ) 90 tablet 3  . Calcium Carbonate-Vitamin D (CALCIUM  600/VITAMIN D) 600-400 MG-UNIT per tablet Take 1 tablet by mouth 2 (two) times daily.     . carisoprodol (SOMA) 350 MG tablet TAKE 1 TABLET BY MOUTH AT BEDTIME 90 tablet 1  . cefadroxil (DURICEF) 500 MG capsule Take 1 capsule (500 mg total) by mouth 2 (two) times daily. Start one hour before office procedure on 07-19-16 20 capsule 0  . clobetasol ointment (TEMOVATE) 1.30 % Apply 1 application topically every other day. (Patient taking differently: Apply 1 application topically as needed. ) 30 g 1  . HYDROcodone-acetaminophen (NORCO) 5-325 MG tablet Take 1 tablet by mouth every 6 (six) hours as needed for moderate pain. 30 tablet 0  . loratadine (CLARITIN) 10 MG tablet Take 10 mg by mouth daily.      . mometasone (ELOCON) 0.1 % lotion Apply topically daily. 60 mL 1  . naproxen (NAPROSYN) 500 MG tablet TAKE 1 TABLET TWICE A DAY  WITH MEALS 180 tablet 3  . omeprazole  (PRILOSEC) 20 MG capsule Take 1 capsule (20 mg total) by mouth daily. (Patient taking differently: Take 20 mg by mouth at bedtime. ) 90 capsule 3  . polyethylene glycol powder (GLYCOLAX/MIRALAX) powder TAKE 1 CAPFUL(17G) BY MOUTH DAILY 1054 g 3  . Propylene Glycol (SYSTANE BALANCE OP) Place 1 drop into both eyes daily.    . traMADol-acetaminophen (ULTRACET) 37.5-325 MG tablet TAKE 2 TABLETS BY MOUTH AT BEDTIME 180 tablet 1  . traZODone (DESYREL) 100 MG tablet TAKE 1.5 TABLETS(=150MG) AT BEDTIME (Patient taking differently: Take 150 mg by mouth at bedtime. TAKE 1.5 TABLETS(=150MG) AT BEDTIME) 135 tablet 3  . triamterene-hydrochlorothiazide (DYAZIDE) 37.5-25 MG capsule TAKE 1 CAPSULE EVERY       MORNING 90 capsule 3  . VOLTAREN 1 % GEL APPLY 2G TOPICALLY FOUR TIMES DAILY (Patient taking differently: APPLY 2G TOPICALLY FOUR TIMES DAILY-PRN) 100 g 3  . letrozole (FEMARA) 2.5 MG tablet Take 1 tablet (2.5 mg total) by mouth daily. 90 tablet 3   No current facility-administered medications for this visit.     Review of Systems Review of Systems  Blood pressure 118/64, pulse 74, resp. rate 12, height _0  (1.702 m), weight 167 lb (75.8 kg).  Physical Exam Physical Exam  Constitutional: She is oriented to person, place, and time. She appears well-developed and well-nourished.  Pulmonary/Chest:  Mammosite incision is clean and well healed.   Neurological: She is alert and oriented to person, place, and time.  Skin: Skin is warm and dry.    Data Reviewed Radiation oncology notes. Mammoprint results showed the patient to have a luminal type cancer and little benefit from adjuvant chemotherapy. Low risk.   Assessment    Doing well status post accelerated breast partial radiation.    Plan    Original plans had been for the patient have a repeat mammogram prior to balloon placement. This does not occur. We'll plan on a month follow-up to establish a new baseline.   Discussed [place] with  patient, she agree  The patient was offered formal medical oncology consultation. She declined. Patient to return in one month.   This information has been scribed by Gaspar Cola CMA.  Robert Bellow 08/01/2016, 8:52 PM

## 2016-08-01 NOTE — Patient Instructions (Addendum)
Patient to return in one month. 

## 2016-08-01 NOTE — Telephone Encounter (Signed)
Electronic refill request.  Last Filled:    180 tablet 1 01/27/2016  Last office visit:   10/20/15  Please advise.

## 2016-08-02 NOTE — Telephone Encounter (Signed)
Rx called to pharmacy as instructed. 

## 2016-08-02 NOTE — Telephone Encounter (Signed)
Please call in.  Thanks.   

## 2016-08-08 ENCOUNTER — Encounter: Payer: Self-pay | Admitting: Family Medicine

## 2016-08-08 ENCOUNTER — Ambulatory Visit: Payer: Medicare Other | Admitting: Family Medicine

## 2016-08-08 ENCOUNTER — Ambulatory Visit (INDEPENDENT_AMBULATORY_CARE_PROVIDER_SITE_OTHER): Payer: Medicare Other | Admitting: Family Medicine

## 2016-08-08 VITALS — BP 112/62 | HR 76 | Temp 98.3°F | Wt 168.0 lb

## 2016-08-08 DIAGNOSIS — R300 Dysuria: Secondary | ICD-10-CM

## 2016-08-08 LAB — POC URINALSYSI DIPSTICK (AUTOMATED)
Bilirubin, UA: NEGATIVE
GLUCOSE UA: NEGATIVE
KETONES UA: NEGATIVE
Nitrite, UA: NEGATIVE
SPEC GRAV UA: 1.015
UROBILINOGEN UA: 0.2
pH, UA: 8

## 2016-08-08 MED ORDER — SULFAMETHOXAZOLE-TRIMETHOPRIM 800-160 MG PO TABS: 1.0000 | ORAL_TABLET | Freq: Two times a day (BID) | ORAL | 0 refills | Status: DC

## 2016-08-08 NOTE — Progress Notes (Signed)
S/p breast surgery and recently started femara.   Port placed and removed.  Was prev on abx, she thought she had a yeast infection vs lichen planus flare.  She didn't improve with topical steroid but improved with topical antifungal.    More urinary frequency, feeling of incomplete voiding.  duration of symptoms: a few days Some better today than prev but not resolved.  abdominal pain:no fevers:no  back pain:no  Vomiting:no  No discharge.    She feels well o/w. Her husband is doing better in the meantime.   Meds, vitals, and allergies reviewed.   Per HPI unless specifically indicated in ROS section   GEN: nad, alert and oriented HEENT: mucous membranes moist NECK: supple CV: rrr.  PULM: ctab, no inc wob ABD: soft, +bs, suprapubic area not tender EXT: no edema SKIN: no acute rash BACK: no CVA pain

## 2016-08-08 NOTE — Patient Instructions (Signed)
Drink plenty of water and start the antibiotics today.  We'll contact you with your lab report.  Take care.   

## 2016-08-08 NOTE — Progress Notes (Signed)
Pre visit review using our clinic review tool, if applicable. No additional management support is needed unless otherwise documented below in the visit note. 

## 2016-08-08 NOTE — Assessment & Plan Note (Signed)
Likely cystitis, d/w pt.  ucx pending. Septra, fluids, f/u prn.  She agrees.

## 2016-08-11 LAB — URINE CULTURE

## 2016-08-25 ENCOUNTER — Telehealth: Payer: Self-pay

## 2016-08-25 MED ORDER — SULFAMETHOXAZOLE-TRIMETHOPRIM 800-160 MG PO TABS: 1.0000 | ORAL_TABLET | Freq: Two times a day (BID) | ORAL | 0 refills | Status: DC

## 2016-08-25 NOTE — Telephone Encounter (Signed)
Pt left v/m that pt is traveling and will be in [place] until 08/26/16; pt was seen 08/08/16 and thinks UTI is returning and request med to [place] [facility]. Pt request cb.

## 2016-08-25 NOTE — Telephone Encounter (Signed)
Would restart septra.  Sent longer course of med to [facility] in [place], [place].   F/u prn, if not better. Thanks.

## 2016-08-25 NOTE — Telephone Encounter (Signed)
Spoke to pt and advised per Dr Damita Dunnings. Pt Verbally expressed understanding

## 2016-08-29 ENCOUNTER — Ambulatory Visit: Payer: Medicare Other | Admitting: General Surgery

## 2016-08-31 ENCOUNTER — Encounter: Payer: Self-pay | Admitting: Radiation Oncology

## 2016-08-31 ENCOUNTER — Encounter: Payer: Self-pay | Admitting: General Surgery

## 2016-08-31 ENCOUNTER — Ambulatory Visit (INDEPENDENT_AMBULATORY_CARE_PROVIDER_SITE_OTHER): Payer: Medicare Other | Admitting: General Surgery

## 2016-08-31 ENCOUNTER — Ambulatory Visit
Admission: RE | Admit: 2016-08-31 | Discharge: 2016-08-31 | Disposition: A | Discharge status: Home / Self Care | Payer: Medicare Other | Source: Ambulatory Visit | Attending: Radiation Oncology | Admitting: Radiation Oncology

## 2016-08-31 VITALS — BP 126/76 | HR 84 | Temp 97.1°F | Wt 169.8 lb

## 2016-08-31 VITALS — BP 130/68 | HR 74 | Resp 14 | Ht 64.0 in | Wt 169.0 lb

## 2016-08-31 DIAGNOSIS — D0511 Intraductal carcinoma in situ of right breast: Secondary | ICD-10-CM | POA: Diagnosis not present

## 2016-08-31 DIAGNOSIS — Z79811 Long term (current) use of aromatase inhibitors: Secondary | ICD-10-CM | POA: Insufficient documentation

## 2016-08-31 DIAGNOSIS — C50411 Malignant neoplasm of upper-outer quadrant of right female breast: Secondary | ICD-10-CM

## 2016-08-31 DIAGNOSIS — Z923 Personal history of irradiation: Secondary | ICD-10-CM | POA: Diagnosis not present

## 2016-08-31 DIAGNOSIS — Z170 Estrogen receptor positive status [ER+]: Secondary | ICD-10-CM | POA: Insufficient documentation

## 2016-08-31 NOTE — Patient Instructions (Addendum)
The patient is aware to call back for any questions or concerns.    Patient to return in June 2018 for bilateral diagnotic mammogram.

## 2016-08-31 NOTE — Progress Notes (Signed)
Patient ID: Alyssa Nolan, female   DOB: [birth year], 75 y.o.   MRN: [id number]  Chief Complaint  Patient presents with  . Follow-up    HPI Alyssa Nolan is a 75 y.o. female.  Here for follow up post mammosite.Patient states she is doing well.She did report that she is experiencing some mild nausea for about an hour after making use of left or the in the morning.  HPI  Past Medical History:  Diagnosis Date  . Arthritis 2011   R hip injection per Dr. Nelva Bush  . Breast cancer ([place]) 04/28/2016   T1c, N0; ER/PR positive, HER-2/neu negative. INVASIVE DUCTAL CARCINOMA., DCIS  . GERD (gastroesophageal reflux disease)   . History of chickenpox   . Hyperlipidemia   . Hypertension   . Lichen planus   . Osteopenia    Tscore -1, consider repeat in ~2013, per Dr. Amalia Hailey with gyn  . 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 BASAL CELL CARCINOMA EXCISION  01-22-14   chest area. Dr Nehemiah Massed  . BREAST BIOPSY Right 2013   CORE - NEG, cyst with ductal hyperplasia without atypia Dr Bary Castilla  . BREAST BIOPSY Right 04-28-16   Invasive ductal carcinoma  . BREAST CYST ASPIRATION Bilateral   . BREAST EXCISIONAL BIOPSY Right 1968   NEG  . BREAST LUMPECTOMY WITH NEEDLE LOCALIZATION Right 06/10/2016   Procedure: BREAST LUMPECTOMY WITH NEEDLE LOCALIZATION;  Surgeon: Robert Bellow, MD;  Location: [facility] ORS;  Service: General;  Laterality: Right;  . BREAST LUMPECTOMY WITH SENTINEL LYMPH NODE BIOPSY Right 06/10/2016   Procedure: BREAST LUMPECTOMY WITH SENTINEL LYMPH NODE BX / WITH RECONSTRUCTION;  Surgeon: Robert Bellow, MD;  Location: [facility] ORS;  Service: General;  Laterality: Right;  . BREAST SURGERY  1968   Fibroadenoma  . CARDIOVASCULAR STRESS TEST  02/07/2002  . [place]  . CHOLECYSTECTOMY  2005   Dr Helane Rima, [place]  . COLONOSCOPY  04-22-02   Dr Ricarda Frame, [place]  . COLONOSCOPY  09-02-12   Dr Bary Castilla  . Endometrial ablation and  right ovary removal  2002  . MRI, hip  01/12/2004  . MRI, neck  06/23/1998  10/31/2003  . OOPHORECTOMY Bilateral 15+ YRS AGO  . Removal of excess eyelids and shortening of eyelid tendons  01/2010  . 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 Right hip injection  03/30/2009  . Touch Up of excess eyelids  04/20/2010    Family History  Problem Relation Age of Onset  . COPD Mother     Smoker  . Heart disease Father     Possible CHF  . Stroke Maternal Grandfather 83    CVA  . Colon cancer      mother's 1/2 sister  . Breast cancer Neg Hx   . Diabetes Neg Hx     Social History Social History  Substance Use Topics  . Smoking status: Former Smoker    Types: Cigarettes    Quit date: 11/14/1969  . Smokeless tobacco: Never Used  . Alcohol use Yes     Comment: Rare    Allergies  Allergen Reactions  . Codeine     REACTION: Felt like she was going to "pass out"    Current Outpatient Prescriptions  Medication Sig Dispense Refill  . atorvastatin (LIPITOR) 20 MG tablet Take 1 tablet (20 mg total) by mouth daily. (Patient taking  differently: Take 20 mg by mouth daily at 6 PM. ) 90 tablet 3  . Calcium Carbonate-Vitamin D (CALCIUM 600/VITAMIN D) 600-400 MG-UNIT per tablet Take 1 tablet by mouth 2 (two) times daily.     . carisoprodol (SOMA) 350 MG tablet TAKE 1 TABLET BY MOUTH AT BEDTIME 90 tablet 1  . clobetasol ointment (TEMOVATE) 4.07 % Apply 1 application topically every other day. (Patient taking differently: Apply 1 application topically as needed. ) 30 g 1  . letrozole (FEMARA) 2.5 MG tablet Take 1 tablet (2.5 mg total) by mouth daily. 90 tablet 3  . loratadine (CLARITIN) 10 MG tablet Take 10 mg by mouth daily.      . mometasone (ELOCON) 0.1 % lotion Apply topically daily. (Patient taking differently: Apply topically daily as needed. ) 60 mL 1  . naproxen (NAPROSYN) 500 MG tablet TAKE 1 TABLET TWICE A DAY  WITH MEALS 180 tablet 3  . omeprazole (PRILOSEC) 20 MG  capsule Take 1 capsule (20 mg total) by mouth daily. (Patient taking differently: Take 20 mg by mouth at bedtime. ) 90 capsule 3  . polyethylene glycol powder (GLYCOLAX/MIRALAX) powder TAKE 1 CAPFUL(17G) BY MOUTH DAILY 1054 g 3  . Propylene Glycol (SYSTANE BALANCE OP) Place 1 drop into both eyes daily.    Marland Kitchen sulfamethoxazole-trimethoprim (BACTRIM DS,SEPTRA DS) 800-160 MG tablet Take 1 tablet by mouth 2 (two) times daily. 14 tablet 0  . traMADol-acetaminophen (ULTRACET) 37.5-325 MG tablet TAKE 2 TABLETS BY MOUTH AT BEDTIME 180 tablet 1  . traZODone (DESYREL) 100 MG tablet TAKE 1.5 TABLETS(=150MG) AT BEDTIME (Patient taking differently: Take 150 mg by mouth at bedtime. TAKE 1.5 TABLETS(=150MG) AT BEDTIME) 135 tablet 3  . triamterene-hydrochlorothiazide (DYAZIDE) 37.5-25 MG capsule TAKE 1 CAPSULE EVERY       MORNING 90 capsule 3  . VOLTAREN 1 % GEL APPLY 2G TOPICALLY FOUR TIMES DAILY (Patient taking differently: APPLY 2G TOPICALLY FOUR TIMES DAILY-PRN) 100 g 3   No current facility-administered medications for this visit.     Review of Systems Review of Systems  Constitutional: Negative.   Respiratory: Negative.   Cardiovascular: Negative.     Blood pressure 130/68, pulse 74, resp. rate 14, height _0  (1.626 m), weight 169 lb (76.7 kg).  Physical Exam Physical Exam  Constitutional: She is oriented to person, place, and time. She appears well-developed and well-nourished.  Eyes: Conjunctivae are normal. No scleral icterus.  Neck: Neck supple.  Cardiovascular: Normal rate and regular rhythm.   Pulmonary/Chest: Effort normal and breath sounds normal. Right breast exhibits no inverted nipple, no mass, no nipple discharge, no skin change and no tenderness. Left breast exhibits no inverted nipple, no mass, no nipple discharge, no skin change and no tenderness.  Lymphadenopathy:    She has no cervical adenopathy.    She has no axillary adenopathy.  Neurological: She is alert and oriented to  person, place, and time.  Skin: Skin is warm and dry.  Psychiatric: Her behavior is normal.      Assessment    Doing well status post breast conservation. No significant vasomotor symptoms. Mild nausea possibly related to let all therapy.    Plan    The patient will adjust when she takes letrozole to see if this makes any difference in her nausea. She'll give a phone follow-up if no improvement is noted.     Patient to return in June 2018 for bilateral diagnotic mammogram. This information has been scribed by Karie Fetch RN, BSN,BC.  Robert Bellow 09/02/2016, 6:44 AM

## 2016-08-31 NOTE — Progress Notes (Signed)
Radiation Oncology Follow up Note  Name: Alyssa Nolan   Date:   08/31/2016 MRN:  [id number] DOB: 01-27-1941    This 75 y.o. female presents to the clinic today for one-month follow-up status post accelerated partial breast irradiation to the right breast.  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 Seen today in routine follow-up she is doing well she specifically denies breast tenderness cough or bone pain. She has been started on. Letrozole and tolerating that well without side effect.  COMPLICATIONS OF TREATMENT: none  FOLLOW UP COMPLIANCE: keeps appointments   PHYSICAL EXAM:  BP 126/76   Pulse 84   Temp 97.1 F (36.2 C)   Wt 169 lb 12.1 oz (77 kg)   BMI 26.59 kg/m  Lungs are clear to A&P cardiac examination essentially unremarkable with regular rate and rhythm. No dominant mass or nodularity is noted in either breast in 2 positions examined. Incision is well-healed. No axillary or supraclavicular adenopathy is appreciated. Cosmetic result is excellent. Well-developed well-nourished patient in NAD. HEENT reveals PERLA, EOMI, discs not visualized.  Oral cavity is clear. No oral mucosal lesions are identified. Neck is clear without evidence of cervical or supraclavicular adenopathy. Lungs are clear to A&P. Cardiac examination is essentially unremarkable with regular rate and rhythm without murmur rub or thrill. Abdomen is benign with no organomegaly or masses noted. Motor sensory and DTR levels are equal and symmetric in the upper and lower extremities. Cranial nerves II through XII are grossly intact. Proprioception is intact. No peripheral adenopathy or edema is identified. No motor or sensory levels are noted. Crude visual fields are within normal range.  RADIOLOGY RESULTS: No current films for  review  PLAN: Present time patient is doing well 1 month out from accelerated partial breast radiation. I'm please were overall progress. She is on letrozole tolerating that well without side effect. I've asked to see her back in 4-5 months for follow-up. She knows to call with any concerns.  I would like to take this opportunity to thank you for allowing me to participate in the care of your patient.Armstead Peaks., MD

## 2016-09-14 ENCOUNTER — Ambulatory Visit: Payer: Medicare Other

## 2016-09-15 ENCOUNTER — Ambulatory Visit (INDEPENDENT_AMBULATORY_CARE_PROVIDER_SITE_OTHER): Payer: Medicare Other

## 2016-09-15 DIAGNOSIS — Z23 Encounter for immunization: Secondary | ICD-10-CM

## 2016-09-16 DIAGNOSIS — M545 Low back pain: Secondary | ICD-10-CM | POA: Diagnosis not present

## 2016-10-05 ENCOUNTER — Telehealth: Payer: Self-pay | Admitting: Family Medicine

## 2016-10-05 NOTE — Telephone Encounter (Signed)
LVM for pt to call back and schedule AWV + labs with Lesia and OV 30 with PCP. °

## 2016-10-05 NOTE — Telephone Encounter (Signed)
Appointment with lisa and labs 12/12 Office visit with dr Damita Dunnings 12/18 Pt aware

## 2016-10-10 DIAGNOSIS — L820 Inflamed seborrheic keratosis: Secondary | ICD-10-CM | POA: Diagnosis not present

## 2016-10-10 DIAGNOSIS — D229 Melanocytic nevi, unspecified: Secondary | ICD-10-CM | POA: Diagnosis not present

## 2016-10-10 DIAGNOSIS — L821 Other seborrheic keratosis: Secondary | ICD-10-CM | POA: Diagnosis not present

## 2016-10-10 DIAGNOSIS — D1800 Hemangioma unspecified site: Secondary | ICD-10-CM | POA: Diagnosis not present

## 2016-10-10 DIAGNOSIS — Z85828 Personal history of other malignant neoplasm of skin: Secondary | ICD-10-CM | POA: Diagnosis not present

## 2016-10-10 DIAGNOSIS — L570 Actinic keratosis: Secondary | ICD-10-CM | POA: Diagnosis not present

## 2016-10-10 DIAGNOSIS — L578 Other skin changes due to chronic exposure to nonionizing radiation: Secondary | ICD-10-CM | POA: Diagnosis not present

## 2016-10-10 DIAGNOSIS — L812 Freckles: Secondary | ICD-10-CM | POA: Diagnosis not present

## 2016-10-10 DIAGNOSIS — Z1283 Encounter for screening for malignant neoplasm of skin: Secondary | ICD-10-CM | POA: Diagnosis not present

## 2016-10-23 ENCOUNTER — Other Ambulatory Visit: Payer: Self-pay | Admitting: Family Medicine

## 2016-10-23 DIAGNOSIS — M8580 Other specified disorders of bone density and structure, unspecified site: Secondary | ICD-10-CM

## 2016-10-23 DIAGNOSIS — M899 Disorder of bone, unspecified: Secondary | ICD-10-CM

## 2016-10-23 DIAGNOSIS — E785 Hyperlipidemia, unspecified: Secondary | ICD-10-CM

## 2016-10-25 ENCOUNTER — Ambulatory Visit (INDEPENDENT_AMBULATORY_CARE_PROVIDER_SITE_OTHER): Payer: Medicare Other

## 2016-10-25 VITALS — BP 130/70 | HR 67 | Temp 97.8°F | Ht 63.0 in | Wt 168.0 lb

## 2016-10-25 DIAGNOSIS — M899 Disorder of bone, unspecified: Secondary | ICD-10-CM | POA: Diagnosis not present

## 2016-10-25 DIAGNOSIS — E785 Hyperlipidemia, unspecified: Secondary | ICD-10-CM | POA: Diagnosis not present

## 2016-10-25 DIAGNOSIS — Z0000 Encounter for general adult medical examination without abnormal findings: Secondary | ICD-10-CM | POA: Diagnosis not present

## 2016-10-25 DIAGNOSIS — M8580 Other specified disorders of bone density and structure, unspecified site: Secondary | ICD-10-CM

## 2016-10-25 LAB — COMPREHENSIVE METABOLIC PANEL
ALT: 12 U/L (ref 0–35)
AST: 19 U/L (ref 0–37)
Albumin: 4.2 g/dL (ref 3.5–5.2)
Alkaline Phosphatase: 57 U/L (ref 39–117)
BILIRUBIN TOTAL: 0.6 mg/dL (ref 0.2–1.2)
BUN: 13 mg/dL (ref 6–23)
CALCIUM: 9.9 mg/dL (ref 8.4–10.5)
CHLORIDE: 102 meq/L (ref 96–112)
CO2: 30 meq/L (ref 19–32)
Creatinine, Ser: 0.79 mg/dL (ref 0.40–1.20)
GFR: 75.76 mL/min (ref >60.00)
Glucose, Bld: 98 mg/dL (ref 70–99)
Potassium: 3.9 mEq/L (ref 3.5–5.1)
Sodium: 139 mEq/L (ref 135–145)
Total Protein: 7 g/dL (ref 6.0–8.3)

## 2016-10-25 LAB — LDL CHOLESTEROL, DIRECT: Direct LDL: 124 mg/dL

## 2016-10-25 LAB — LIPID PANEL
CHOLESTEROL: 206 mg/dL — AB (ref 0–200)
HDL: 40 mg/dL (ref >39.00)
NONHDL: 165.72
TRIGLYCERIDES: 219 mg/dL — AB (ref 0.0–149.0)
Total CHOL/HDL Ratio: 5
VLDL: 43.8 mg/dL — ABNORMAL HIGH (ref 0.0–40.0)

## 2016-10-25 LAB — VITAMIN D 25 HYDROXY (VIT D DEFICIENCY, FRACTURES): VITD: 45.69 ng/mL (ref 30.00–100.00)

## 2016-10-25 NOTE — Patient Instructions (Signed)
Alyssa Nolan , Thank you for taking time to come for your Medicare Wellness Visit. I appreciate your ongoing commitment to your health goals. Please review the following plan we discussed and let me know if I can assist you in the future.   These are the goals we discussed: Goals    . Increase physical activity          Starting 10/25/2016, I will continue to exercise for at least 60 min 3 days per week.        This is a list of the screening recommended for you and due dates:  Health Maintenance  Topic Date Due  . Mammogram  03/23/2018  . Tetanus Vaccine  07/07/2020  . Colon Cancer Screening  08/15/2022  . Flu Shot  Completed  . DEXA scan (bone density measurement)  Completed  . Shingles Vaccine  Completed  . Pneumonia vaccines  Completed   Preventive Care for Adults  A healthy lifestyle and preventive care can promote health and wellness. Preventive health guidelines for adults include the following key practices.  . A routine yearly physical is a good way to check with your health care provider about your health and preventive screening. It is a chance to share any concerns and updates on your health and to receive a thorough exam.  . Visit your dentist for a routine exam and preventive care every 6 months. Brush your teeth twice a day and floss once a day. Good oral hygiene prevents tooth decay and gum disease.  . The frequency of eye exams is based on your age, health, family medical history, use  of contact lenses, and other factors. Follow your health care provider's ecommendations for frequency of eye exams.  . Eat a healthy diet. Foods like vegetables, fruits, whole grains, low-fat dairy products, and lean protein foods contain the nutrients you need without too many calories. Decrease your intake of foods high in solid fats, added sugars, and salt. Eat the right amount of calories for you. Get information about a proper diet from your health care provider, if  necessary.  . Regular physical exercise is one of the most important things you can do for your health. Most adults should get at least 150 minutes of moderate-intensity exercise (any activity that increases your heart rate and causes you to sweat) each week. In addition, most adults need muscle-strengthening exercises on 2 or more days a week.  Silver Sneakers may be a benefit available to you. To determine eligibility, you may visit the website: www.silversneakers.com or contact program at 314-748-8384 Mon-Fri between 8AM-8PM.   . Maintain a healthy weight. The body mass index (BMI) is a screening tool to identify possible weight problems. It provides an estimate of body fat based on height and weight. Your health care provider can find your BMI and can help you achieve or maintain a healthy weight.   For adults 20 years and older: ? A BMI below 18.5 is considered underweight. ? A BMI of 18.5 to 24.9 is normal. ? A BMI of 25 to 29.9 is considered overweight. ? A BMI of 30 and above is considered obese.   . Maintain normal blood lipids and cholesterol levels by exercising and minimizing your intake of saturated fat. Eat a balanced diet with plenty of fruit and vegetables. Blood tests for lipids and cholesterol should begin at age 36 and be repeated every 5 years. If your lipid or cholesterol levels are high, you are over 50, or you are  at high risk for heart disease, you may need your cholesterol levels checked more frequently. Ongoing high lipid and cholesterol levels should be treated with medicines if diet and exercise are not working.  . If you smoke, find out from your health care provider how to quit. If you do not use tobacco, please do not start.  . If you choose to drink alcohol, please do not consume more than 2 drinks per day. One drink is considered to be 12 ounces (355 mL) of beer, 5 ounces (148 mL) of wine, or 1.5 ounces (44 mL) of liquor.  . If you are 62-1 years old, ask your  health care provider if you should take aspirin to prevent strokes.  . Use sunscreen. Apply sunscreen liberally and repeatedly throughout the day. You should seek shade when your shadow is shorter than you. Protect yourself by wearing long sleeves, pants, a wide-brimmed hat, and sunglasses year round, whenever you are outdoors.  . Once a month, do a whole body skin exam, using a mirror to look at the skin on your back. Tell your health care provider of new moles, moles that have irregular borders, moles that are larger than a pencil eraser, or moles that have changed in shape or color.

## 2016-10-25 NOTE — Progress Notes (Signed)
Pre visit review using our clinic review tool, if applicable. No additional management support is needed unless otherwise documented below in the visit note. 

## 2016-10-25 NOTE — Progress Notes (Signed)
Subjective:   Alyssa Nolan is a 75 y.o. female who presents for Medicare Annual (Subsequent) preventive examination.  Review of Systems:  N/A Cardiac Risk Factors include: advanced age (>41mn, >>63women);hypertension;dyslipidemia     Objective:     Vitals: BP 130/70 (BP Location: Left Arm, Patient Position: Sitting, Cuff Size: Normal)   Pulse 67   Temp 97.8 F (36.6 C) (Oral)   Ht 5' 3"  (1.6 m) Comment: no shoes  Wt 168 lb (76.2 kg)   SpO2 97%   BMI 29.76 kg/m   Body mass index is 29.76 kg/m.   Tobacco History  Smoking Status  . Former Smoker  . Types: Cigarettes  . Quit date: 11/14/1969  Smokeless Tobacco  . Never Used     Counseling given: No   Past Medical History:  Diagnosis Date  . Arthritis 2011   R hip injection per Dr. RNelva Bush . Breast cancer ([facility] 04/28/2016   T1c, N0; ER/PR positive, HER-2/neu negative. INVASIVE DUCTAL CARCINOMA., DCIS  . GERD (gastroesophageal reflux disease)   . History of chickenpox   . Hyperlipidemia   . Hypertension   . Lichen planus   . Osteopenia    Tscore -1, consider repeat in ~2013, per Dr. EAmalia Haileywith gyn  . 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 BASAL CELL CARCINOMA EXCISION  01-22-14   chest area. Dr KNehemiah Massed . BREAST BIOPSY Right 2013   CORE - NEG, cyst with ductal hyperplasia without atypia Dr BBary Castilla . BREAST BIOPSY Right 04-28-16   Invasive ductal carcinoma  . BREAST CYST ASPIRATION Bilateral   . BREAST EXCISIONAL BIOPSY Right 1968   NEG  . BREAST LUMPECTOMY WITH NEEDLE LOCALIZATION Right 06/10/2016   Procedure: BREAST LUMPECTOMY WITH NEEDLE LOCALIZATION;  Surgeon: JRobert Bellow MD;  Location: [facility] ORS;  Service: General;  Laterality: Right;  . BREAST LUMPECTOMY WITH SENTINEL LYMPH NODE BIOPSY Right 06/10/2016   Procedure: BREAST LUMPECTOMY WITH SENTINEL LYMPH NODE BX / WITH RECONSTRUCTION;  Surgeon: JRobert Bellow MD;  Location: [facility] ORS;   Service: General;  Laterality: Right;  . BREAST SURGERY  1968   Fibroadenoma  . CARDIOVASCULAR STRESS TEST  02/07/2002  [place]  [place]   [place]  04-[place]02   [place]OS[place]  09-02-12   Dr BBary Castilla . Endometrial ablation and right ovary removal  2002  . MRI, hip  01/12/2004  . MRI, neck  06/23/1998  10/31/2003  . OOPHORECTOMY Bilateral 15+ YRS AGO  . Removal of excess eyelids and shortening of eyelid tendons  01/2010  . 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 Right hip injection  03/30/2009  . Touch Up of excess eyelids  04/20/2010   Family History  Problem Relation Age of Onset  . COPD Mother     Smoker  . Heart disease Father     Possible CHF  . Stroke Maternal Grandfather 83    CVA  . Colon cancer      mother's 1/2 sister  . Breast cancer Neg Hx   . Diabetes Neg Hx    History  Sexual Activity  . Sexual activity: Not Currently  . Birth control/ protection: Post-menopausal    Outpatient Encounter Prescriptions as of 10/25/2016  Medication Sig  . acetaminophen (TYLENOL) 500 MG tablet  Take 1,000 mg by mouth as needed.  Marland Kitchen atorvastatin (LIPITOR) 20 MG tablet Take 1 tablet (20 mg total) by mouth daily. (Patient taking differently: Take 20 mg by mouth daily at 6 PM. )  . Calcium Carbonate-Vitamin D (CALCIUM 600/VITAMIN D) 600-400 MG-UNIT per tablet Take 1 tablet by mouth 2 (two) times daily.   . carisoprodol (SOMA) 350 MG tablet TAKE 1 TABLET BY MOUTH AT BEDTIME  . clobetasol ointment (TEMOVATE) 2.33 % Apply 1 application topically every other day. (Patient taking differently: Apply 1 application topically as needed. )  . letrozole (FEMARA) 2.5 MG tablet Take 1 tablet (2.5 mg total) by mouth daily.  Marland Kitchen loratadine (CLARITIN) 10 MG tablet Take 10 mg by mouth daily.    . meloxicam (MOBIC) 15 MG tablet Take 15 mg by mouth as needed for pain.  Marland Kitchen omeprazole (PRILOSEC) 20 MG capsule Take 1  capsule (20 mg total) by mouth daily. (Patient taking differently: Take 20 mg by mouth at bedtime. )  . polyethylene glycol powder (GLYCOLAX/MIRALAX) powder TAKE 1 CAPFUL(17G) BY MOUTH DAILY  . Propylene Glycol (SYSTANE BALANCE OP) Place 1 drop into both eyes daily.  . traMADol-acetaminophen (ULTRACET) 37.5-325 MG tablet TAKE 2 TABLETS BY MOUTH AT BEDTIME  . traZODone (DESYREL) 100 MG tablet TAKE 1.5 TABLETS(=150MG) AT BEDTIME (Patient taking differently: Take 150 mg by mouth at bedtime. TAKE 1.5 TABLETS(=150MG) AT BEDTIME)  . triamterene-hydrochlorothiazide (DYAZIDE) 37.5-25 MG capsule TAKE 1 CAPSULE EVERY       MORNING  . VOLTAREN 1 % GEL APPLY 2G TOPICALLY FOUR TIMES DAILY (Patient taking differently: APPLY 2G TOPICALLY FOUR TIMES DAILY-PRN)  . [DISCONTINUED] naproxen (NAPROSYN) 500 MG tablet TAKE 1 TABLET TWICE A DAY  WITH MEALS  . [DISCONTINUED] sulfamethoxazole-trimethoprim (BACTRIM DS,SEPTRA DS) 800-160 MG tablet Take 1 tablet by mouth 2 (two) times daily.   No facility-administered encounter medications on file as of 10/25/2016.     Activities of Daily Living In your present state of health, do you have any difficulty performing the following activities: 10/25/2016 06/02/2016  Hearing? Y N  Vision? N N  Difficulty concentrating or making decisions? N N  Walking or climbing stairs? N N  Dressing or bathing? N N  Doing errands, shopping? N N  Preparing Food and eating ? N -  Using the Toilet? N -  In the past six months, have you accidently leaked urine? N -  Do you have problems with loss of bowel control? N -  Managing your Medications? N -  Managing your Finances? N -  Housekeeping or managing your Housekeeping? N -  Some recent data might be hidden    Patient Care Team: Tonia Ghent, MD as PCP - General Brayton Mars, MD as Referring Physician (Obstetrics and Gynecology) Robert Bellow, MD as Consulting Physician (General Surgery) Ralene Bathe, MD as  Consulting Physician (Dermatology) Latanya Maudlin, MD as Consulting Physician (Orthopedic Surgery) Arelia Sneddon, OD as Consulting Physician (Optometry)    Assessment:    Hearing Screening Comments: Bilateral hearing aids Vision Screening Comments: Last vision exam in May 2017 with Dr. Annamaria Helling  Exercise Activities and Dietary recommendations Current Exercise Habits: Structured exercise class, Type of exercise: Other - see comments (water aerobics), Time (Minutes): 60, Frequency (Times/Week): 3, Weekly Exercise (Minutes/Week): 180, Intensity: Moderate, Exercise limited by: None identified  Goals    . Increase physical activity          Starting 10/25/2016, I will continue to exercise for at least 60  min 3 days per week.       Fall Risk Fall Risk  10/25/2016 10/20/2015 09/12/2013  Falls in the past year? No Yes No  Number falls in past yr: - 1 -   Depression Screen PHQ 2/9 Scores 10/25/2016 10/20/2015 09/12/2013  PHQ - 2 Score 0 0 0     Cognitive Function MMSE - Mini Mental State Exam 10/25/2016  Orientation to time 5  Orientation to Place 5  Registration 3  Attention/ Calculation 0  Recall 3  Language- name 2 objects 0  Language- repeat 1  Language- follow 3 step command 3  Language- read & follow direction 0  Write a sentence 0  Copy design 0  Total score 20     PLEASE NOTE: A Mini-Cog screen was completed. Maximum score is 20. A value of 0 denotes this part of Folstein MMSE was not completed or the patient failed this part of the Mini-Cog screening.   Mini-Cog Screening Orientation to Time - Max 5 pts Orientation to Place - Max 5 pts Registration - Max 3 pts Recall - Max 3 pts Language Repeat - Max 1 pts Language Follow 3 Step Command - Max 3 pts     Immunization History  Administered Date(s) Administered  . Influenza Split 08/10/2011, 08/10/2012  . Influenza Whole 08/06/2010  . Influenza,inj,Quad PF,36+ Mos 09/12/2013, 07/30/2014, 08/07/2015,  09/15/2016  . Pneumococcal Conjugate-13 09/18/2014  . Pneumococcal Polysaccharide-23 01/25/2011  . Td 07/07/2010  . Zoster 08/09/2010   Screening Tests Health Maintenance  Topic Date Due  . MAMMOGRAM  03/23/2018  . TETANUS/TDAP  07/07/2020  . COLONOSCOPY  08/15/2022  . INFLUENZA VACCINE  Completed  . DEXA SCAN  Completed  . ZOSTAVAX  Completed  . PNA vac Low Risk Adult  Completed      Plan:     I have personally reviewed and addressed the Medicare Annual Wellness questionnaire and have noted the following in the patient's chart:  A. Medical and social history B. Use of alcohol, tobacco or illicit drugs  C. Current medications and supplements D. Functional ability and status E.  Nutritional status F.  Physical activity G. Advance directives H. List of other physicians I.  Hospitalizations, surgeries, and ER visits in previous 12 months J.  [place] to include hearing, vision, cognitive, depression L. Referrals and appointments - none  In addition, I have reviewed and discussed with patient certain preventive protocols, quality metrics, and best practice recommendations. A written personalized care plan for preventive services as well as general preventive health recommendations were provided to patient.  See attached scanned questionnaire for additional information.   Signed,   Lindell Noe, MHA, BS, LPN Health Coach

## 2016-10-25 NOTE — Progress Notes (Signed)
PCP notes:   Health maintenance:  No gaps identified. Health maintenance scheduled reviewed with patient.   Abnormal screenings:   None  Patient concerns:   None  Nurse concerns:  None  Next PCP appt:   12/18 @ 1500  I reviewed health advisor's note, was available for consultation on the day of service listed in this note, and agree with documentation and plan. Elsie Stain, MD.

## 2016-10-31 ENCOUNTER — Ambulatory Visit (INDEPENDENT_AMBULATORY_CARE_PROVIDER_SITE_OTHER): Payer: Medicare Other | Admitting: Family Medicine

## 2016-10-31 ENCOUNTER — Encounter: Payer: Self-pay | Admitting: Family Medicine

## 2016-10-31 DIAGNOSIS — E785 Hyperlipidemia, unspecified: Secondary | ICD-10-CM

## 2016-10-31 DIAGNOSIS — I10 Essential (primary) hypertension: Secondary | ICD-10-CM | POA: Diagnosis not present

## 2016-10-31 DIAGNOSIS — G4700 Insomnia, unspecified: Secondary | ICD-10-CM | POA: Diagnosis not present

## 2016-10-31 MED ORDER — TRIAMTERENE-HCTZ 37.5-25 MG PO CAPS: 1.0000 | ORAL_CAPSULE | Freq: Every morning | ORAL | 3 refills | Status: DC

## 2016-10-31 MED ORDER — TRAZODONE HCL 100 MG PO TABS: 150.0000 mg | ORAL_TABLET | Freq: Every day | ORAL | 3 refills | Status: DC

## 2016-10-31 NOTE — Progress Notes (Signed)
Pre visit review using our clinic review tool, if applicable. No additional management support is needed unless otherwise documented below in the visit note. 

## 2016-10-31 NOTE — Progress Notes (Signed)
DXA per gyn.   Mammogram per outside clinic.    Insomnia. No ADE on med.  It helps.  Sleeping better with med.    Elevated Cholesterol: Using medications without problems:yes Muscle aches: no Diet compliance: yes Exercise: limited by back but able to tolerate water aerobics Labs d/w pt.   Intentional weight loss noted over the years, d/w pt.   Hypertension:    Using medication without problems or lightheadedness: yes Chest pain with exertion:no Edema:no Short of breath:no Labs d/w pt.    Back pain.  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    PMH and SH reviewed  ROS: Per HPI unless specifically indicated in ROS section   Meds, vitals, and allergies reviewed.   GEN: nad, alert and oriented HEENT: mucous membranes moist NECK: supple w/o LA CV: rrr. PULM: ctab, no inc wob ABD: soft, +bs EXT: no edema SKIN: no acute rash

## 2016-10-31 NOTE — Patient Instructions (Signed)
Take care.  Glad to see you. Update me as needed.  

## 2016-11-01 NOTE — Assessment & Plan Note (Signed)
Reasonable LDL level. Discussed with patient about diet and exercise. No change in meds. Continue as is. No adverse effect on medication. She agrees.

## 2016-11-01 NOTE — Assessment & Plan Note (Signed)
Controlled. Continue as is. No adverse effect on medication. She agrees.

## 2016-11-01 NOTE — Assessment & Plan Note (Signed)
Controlled. Continue as is. Labs discussed with patient. She agrees. No adverse effect on medication.  >25 minutes spent in face to face time with patient, >50% spent in counselling or coordination of care.

## 2016-11-16 ENCOUNTER — Encounter: Payer: Self-pay | Admitting: Family Medicine

## 2016-11-16 ENCOUNTER — Telehealth: Payer: Self-pay | Admitting: Family Medicine

## 2016-11-16 NOTE — Telephone Encounter (Signed)
Opened in error

## 2016-11-18 ENCOUNTER — Other Ambulatory Visit: Payer: Self-pay | Admitting: Internal Medicine

## 2016-12-05 ENCOUNTER — Encounter: Payer: Self-pay | Admitting: Family Medicine

## 2016-12-05 ENCOUNTER — Ambulatory Visit (INDEPENDENT_AMBULATORY_CARE_PROVIDER_SITE_OTHER): Payer: Medicare Other | Admitting: Family Medicine

## 2016-12-05 VITALS — BP 114/62 | HR 93 | Temp 98.9°F | Wt 165.0 lb

## 2016-12-05 DIAGNOSIS — J069 Acute upper respiratory infection, unspecified: Secondary | ICD-10-CM

## 2016-12-05 MED ORDER — KETOROLAC TROMETHAMINE 60 MG/2ML IM SOLN: 60.0000 mg | Freq: Once | INTRAMUSCULAR | Status: AC

## 2016-12-05 MED ORDER — AZITHROMYCIN 250 MG PO TABS: ORAL_TABLET | ORAL | 0 refills | Status: DC

## 2016-12-05 MED ORDER — BENZONATATE 100 MG PO CAPS: 100.0000 mg | ORAL_CAPSULE | Freq: Two times a day (BID) | ORAL | 0 refills | Status: DC | PRN

## 2016-12-05 NOTE — Patient Instructions (Signed)
Great to see you.  Please take zpack as directed and tessalon as needed for cough.

## 2016-12-05 NOTE — Progress Notes (Signed)
SUBJECTIVE:  Alyssa Nolan is a 76 y.o. female pt of Dr. Damita Dunnings, new to me, who complains of coryza, congestion, dry cough, headache and vomiting for 5 days. She denies a history of anorexia and chest pain and denies a history of asthma. Patient denies smoke cigarettes.  Husband was treated for the flu last week.   Current Outpatient Prescriptions on File Prior to Visit  Medication Sig Dispense Refill  . acetaminophen (TYLENOL) 500 MG tablet Take 1,000 mg by mouth as needed.    Marland Kitchen atorvastatin (LIPITOR) 20 MG tablet Take 1 tablet (20 mg total) by mouth daily. (Patient taking differently: Take 20 mg by mouth daily at 6 PM. ) 90 tablet 3  . Calcium Carbonate-Vitamin D (CALCIUM 600/VITAMIN D) 600-400 MG-UNIT per tablet Take 1 tablet by mouth 2 (two) times daily.     . carisoprodol (SOMA) 350 MG tablet TAKE 1 TABLET BY MOUTH AT BEDTIME 90 tablet 1  . clobetasol ointment (TEMOVATE) 6.30 % Apply 1 application topically every other day. (Patient taking differently: Apply 1 application topically as needed. ) 30 g 1  . letrozole (FEMARA) 2.5 MG tablet Take 1 tablet (2.5 mg total) by mouth daily. 90 tablet 3  . loratadine (CLARITIN) 10 MG tablet Take 10 mg by mouth daily.      . meloxicam (MOBIC) 15 MG tablet Take 15 mg by mouth as needed for pain.    Marland Kitchen omeprazole (PRILOSEC) 20 MG capsule Take 1 capsule (20 mg total) by mouth daily. (Patient taking differently: Take 20 mg by mouth at bedtime. ) 90 capsule 3  . polyethylene glycol powder (GLYCOLAX/MIRALAX) powder TAKE 1 CAPFUL(17G) BY MOUTH DAILY 527 g 1  . Propylene Glycol (SYSTANE BALANCE OP) Place 1 drop into both eyes daily.    . traMADol-acetaminophen (ULTRACET) 37.5-325 MG tablet TAKE 2 TABLETS BY MOUTH AT BEDTIME 180 tablet 1  . traZODone (DESYREL) 100 MG tablet Take 1.5 tablets (150 mg total) by mouth at bedtime. 135 tablet 3  . triamterene-hydrochlorothiazide (DYAZIDE) 37.5-25 MG capsule Take 1 each (1 capsule total) by mouth every morning.  90 capsule 3  . VOLTAREN 1 % GEL APPLY 2G TOPICALLY FOUR TIMES DAILY (Patient taking differently: APPLY 2G TOPICALLY FOUR TIMES DAILY-PRN) 100 g 3   No current facility-administered medications on file prior to visit.     Allergies  Allergen Reactions  . Codeine     REACTION: Felt like she was going to "pass out"    Past Medical History:  Diagnosis Date  . Arthritis 2011   R hip injection per Dr. Nelva Bush  . Breast cancer ([place]) 04/28/2016   T1c, N0; ER/PR positive, HER-2/neu negative. INVASIVE DUCTAL CARCINOMA., DCIS  . GERD (gastroesophageal reflux disease)   . History of chickenpox   . Hyperlipidemia   . Hypertension   . Lichen planus   . Osteopenia    Tscore -1, consider repeat in ~2013, per Dr. Amalia Hailey with gyn  . 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 BASAL CELL CARCINOMA EXCISION  01-22-14   chest area. Dr Nehemiah Massed  . BREAST BIOPSY Right 2013   CORE - NEG, cyst with ductal hyperplasia without atypia Dr Bary Castilla  . BREAST BIOPSY Right 04-28-16   Invasive ductal carcinoma  . BREAST CYST ASPIRATION Bilateral   . BREAST EXCISIONAL BIOPSY Right 1968   NEG  . BREAST LUMPECTOMY WITH NEEDLE LOCALIZATION Right 06/10/2016   Procedure: BREAST LUMPECTOMY  WITH NEEDLE LOCALIZATION;  Surgeon: Robert Bellow, MD;  Location: [facility] ORS;  Service: General;  Laterality: Right;  . BREAST LUMPECTOMY WITH SENTINEL LYMPH NODE BIOPSY Right 06/10/2016   Procedure: BREAST LUMPECTOMY WITH SENTINEL LYMPH NODE BX / WITH RECONSTRUCTION;  Surgeon: Robert Bellow, MD;  Location: [facility] ORS;  Service: General;  Laterality: Right;  . BREAST SURGERY  1968   Fibroadenoma  . CARDIOVASCULAR STRESS TEST  02/07/2002  . [place]  . CHOLECYSTECTOMY  2005   Dr Helane Rima, [place]  . COLONOSCOPY  04-22-02   Dr Ricarda Frame, [place]  . COLONOSCOPY  09-02-12   Dr Bary Castilla  . Endometrial ablation and right ovary removal  2002  . MRI, hip  01/12/2004  . MRI, neck   06/23/1998  10/31/2003  . OOPHORECTOMY Bilateral 15+ YRS AGO  . Removal of excess eyelids and shortening of eyelid tendons  01/2010  . 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 Right hip injection  03/30/2009  . Touch Up of excess eyelids  04/20/2010    Family History  Problem Relation Age of Onset  . COPD Mother     Smoker  . Heart disease Father     Possible CHF  . Stroke Maternal Grandfather 83    CVA  . Colon cancer      mother's 1/2 sister  . Breast cancer Neg Hx   . Diabetes Neg Hx     Social History   Social History  . Marital status: Married    Spouse name: N/A  . Number of children: 2  . Years of education: N/A   Occupational History  . Retired since 2007 and moved to [employer] Retired   Social History Main Topics  . Smoking status: Former Smoker    Types: Cigarettes    Quit date: 11/14/1969  . Smokeless tobacco: Never Used  . Alcohol use Yes     Comment: Rare  . Drug use: No  . Sexual activity: Not Currently    Birth control/ protection: Post-menopausal   Other Topics Concern  . Not on file   Social History Narrative   From [place]:  [employer] grad   2 kids in [place] cards and likes to knit   The PMH, [place], Social History, Family History, Medications, and allergies have been reviewed in [facility], and have been updated if relevant.  OBJECTIVE  :BP 114/62   Pulse 93   Temp 98.9 F (37.2 C) (Oral)   Wt 165 lb (74.8 kg)   SpO2 97%   BMI 29.23 kg/m   She appears well, vital signs are as noted. Ears normal.  Throat and pharynx normal.  Neck supple. No adenopathy in the neck. Nose is congested. Sinuses non tender. Scattered wheezes LLL  ASSESSMENT:  ILI with possible resp complications of bronchitis/PNA  PLAN:  Outside window for tamiflu.  Lung exam concerning- place on zpack to cover post flu CAP. Headache is severe today- given IM toradol in office.  eRx sent for tessalon to use prn cough. Symptomatic  therapy suggested: push fluids, rest and return office visit prn if symptoms persist or worsen. Call or return to clinic prn if these symptoms worsen or fail to improve as anticipated.

## 2016-12-05 NOTE — Progress Notes (Signed)
Pre visit review using our clinic review tool, if applicable. No additional management support is needed unless otherwise documented below in the visit note. 

## 2016-12-05 NOTE — Addendum Note (Signed)
Addended by: Modena Nunnery on: 12/05/2016 09:33 AM   Modules accepted: Orders

## 2016-12-08 ENCOUNTER — Encounter: Payer: Self-pay | Admitting: Family Medicine

## 2017-01-13 ENCOUNTER — Other Ambulatory Visit: Payer: Self-pay | Admitting: Internal Medicine

## 2017-01-16 ENCOUNTER — Other Ambulatory Visit: Payer: Self-pay | Admitting: Family Medicine

## 2017-01-16 NOTE — Telephone Encounter (Signed)
Received refill electronically Last refill 07/12/16 #90/1 Last office visit 12/05/16-acute

## 2017-01-16 NOTE — Telephone Encounter (Signed)
Please call in.  Thanks.   

## 2017-01-17 NOTE — Telephone Encounter (Signed)
Rx called in to requested pharmacy 

## 2017-01-20 ENCOUNTER — Telehealth: Payer: Self-pay | Admitting: Internal Medicine

## 2017-01-20 MED ORDER — POLYETHYLENE GLYCOL 3350 17 GM/SCOOP PO POWD: ORAL | 1 refills | Status: DC

## 2017-01-20 NOTE — Telephone Encounter (Signed)
Rx sent until appt. Patient advised she does need appt to make sure previous IDA is stable etc. She verbalizes understanding.

## 2017-02-01 ENCOUNTER — Encounter: Payer: Self-pay | Admitting: Family Medicine

## 2017-02-01 MED ORDER — ATORVASTATIN CALCIUM 20 MG PO TABS: 20.0000 mg | ORAL_TABLET | Freq: Every day | ORAL | 2 refills | Status: DC

## 2017-02-08 ENCOUNTER — Ambulatory Visit: Payer: Medicare Other | Admitting: Radiation Oncology

## 2017-02-11 ENCOUNTER — Other Ambulatory Visit: Payer: Self-pay | Admitting: Family Medicine

## 2017-02-11 NOTE — Telephone Encounter (Signed)
Last office visit 12/05/16 with Dr. Deborra Medina for URI.  Last refilled 08/01/16 for #190 with 1 refill.  Ok to refill?

## 2017-02-11 NOTE — Telephone Encounter (Signed)
Please call in.  Thanks.   

## 2017-02-13 NOTE — Telephone Encounter (Signed)
Rx called to pharmacy as instructed. 

## 2017-02-20 ENCOUNTER — Other Ambulatory Visit: Payer: Self-pay | Admitting: Internal Medicine

## 2017-02-22 ENCOUNTER — Ambulatory Visit
Admission: RE | Admit: 2017-02-22 | Discharge: 2017-02-22 | Disposition: A | Discharge status: Home / Self Care | Payer: Medicare Other | Source: Ambulatory Visit | Attending: Radiation Oncology | Admitting: Radiation Oncology

## 2017-02-22 ENCOUNTER — Encounter: Payer: Self-pay | Admitting: Radiation Oncology

## 2017-02-22 ENCOUNTER — Other Ambulatory Visit: Payer: Self-pay

## 2017-02-22 VITALS — BP 127/68 | HR 76 | Temp 98.0°F | Wt 163.8 lb

## 2017-02-22 DIAGNOSIS — D0511 Intraductal carcinoma in situ of right breast: Secondary | ICD-10-CM | POA: Insufficient documentation

## 2017-02-22 DIAGNOSIS — Z79810 Long term (current) use of selective estrogen receptor modulators (SERMs): Secondary | ICD-10-CM | POA: Insufficient documentation

## 2017-02-22 DIAGNOSIS — Z170 Estrogen receptor positive status [ER+]: Secondary | ICD-10-CM | POA: Insufficient documentation

## 2017-02-22 DIAGNOSIS — C50411 Malignant neoplasm of upper-outer quadrant of right female breast: Secondary | ICD-10-CM

## 2017-02-22 NOTE — Progress Notes (Signed)
Radiation Oncology Follow up Note  Name: Alyssa Nolan   Date:   02/22/2017 MRN:  [id number] DOB: Aug 09, 1941    This 76 y.o. female presents to the clinic today for six-month follow-up status post sorry partial breast radiation to her right breast for ductal carcinoma in situ ER/PR positive.  REFERRING PROVIDER: Tonia Ghent, MD  HPI: Patient is a 76 year old female now out 6 months having completed accelerated partial breast irradiation to her right breast for ER/PR positive ductal carcinoma in situ. Seen today in routine follow-up she is doing well. She specifically denies breast tenderness cough or bone pain. She's not seen surgeon in a while. She is currently on tamoxifen tolerating that well although she is having some hot flashes.  COMPLICATIONS OF TREATMENT: none  FOLLOW UP COMPLIANCE: keeps appointments   PHYSICAL EXAM:  BP 127/68   Pulse 76   Temp 98 F (36.7 C)   Wt 163 lb 12.8 oz (74.3 kg)   BMI 29.02 kg/m  Lungs are clear to A&P cardiac examination essentially unremarkable with regular rate and rhythm. No dominant mass or nodularity is noted in either breast in 2 positions examined. Incision is well-healed. No axillary or supraclavicular adenopathy is appreciated. Cosmetic result is excellent. Well-developed well-nourished patient in NAD. HEENT reveals PERLA, EOMI, discs not visualized.  Oral cavity is clear. No oral mucosal lesions are identified. Neck is clear without evidence of cervical or supraclavicular adenopathy. Lungs are clear to A&P. Cardiac examination is essentially unremarkable with regular rate and rhythm without murmur rub or thrill. Abdomen is benign with no organomegaly or masses noted. Motor sensory and DTR levels are equal and symmetric in the upper and lower extremities. Cranial nerves II through XII are grossly intact. Proprioception is intact. No peripheral adenopathy or edema is identified. No motor or sensory levels are noted. Crude visual  fields are within normal range.  RADIOLOGY RESULTS: No current mammograms will refer back to surgeon for ordering of those  PLAN: Present time she is doing well with excellent cosmetic result. I'm please were overall progress. I've given her Dr. Bary Castilla phone number to schedule follow-up appointment and for him to order mammograms. She continues on tamoxifen. I will see her back in 6 months for follow-up and then start once your follow-up visits. Patient knows to call sooner with any concerns.  I would like to take this opportunity to thank you for allowing me to participate in the care of your patient.Armstead Peaks., MD

## 2017-02-23 ENCOUNTER — Other Ambulatory Visit: Payer: Self-pay

## 2017-02-23 DIAGNOSIS — C50411 Malignant neoplasm of upper-outer quadrant of right female breast: Secondary | ICD-10-CM

## 2017-03-07 ENCOUNTER — Ambulatory Visit: Payer: Medicare Other | Admitting: Internal Medicine

## 2017-03-10 ENCOUNTER — Ambulatory Visit (INDEPENDENT_AMBULATORY_CARE_PROVIDER_SITE_OTHER): Payer: Medicare Other | Admitting: Internal Medicine

## 2017-03-10 ENCOUNTER — Encounter: Payer: Self-pay | Admitting: Internal Medicine

## 2017-03-10 VITALS — BP 122/70 | Ht 64.0 in | Wt 163.0 lb

## 2017-03-10 DIAGNOSIS — K5909 Other constipation: Secondary | ICD-10-CM

## 2017-03-10 DIAGNOSIS — K219 Gastro-esophageal reflux disease without esophagitis: Secondary | ICD-10-CM

## 2017-03-10 MED ORDER — OMEPRAZOLE 20 MG PO CPDR: 20.0000 mg | DELAYED_RELEASE_CAPSULE | Freq: Every day | ORAL | 3 refills | Status: DC

## 2017-03-10 MED ORDER — POLYETHYLENE GLYCOL 3350 17 GM/SCOOP PO POWD: ORAL | 3 refills | Status: DC

## 2017-03-10 NOTE — Progress Notes (Signed)
   Subjective:    Patient ID: Alyssa Nolan, female    DOB: 15-Feb-1941, 76 y.o.   MRN: [id number]  HPI Alyssa Nolan is a 76 year old female with a history of iron deficiency anemia, GERD and chronic constipation who is here for follow-up. She was last seen one year ago.She reports she has been doing well from a GI perspective. She was diagnosed with breast cancer in the last year and underwent localized radiation and surgery. She responded very well and is felt to be in remission.  Her constipation continues to respond well to MiraLAX. She is a 17 g once daily. She denies abdominal pain, blood in her stool and melena. She requests a 90 day supply of this medication. Reflux symptoms also well controlled without heartburn, dysphagia or odynophagia. No nausea vomiting. No early satiety. She uses omeprazole 20 mg daily. Previous attempts to discontinue this medication has resulted in recurrent GERD symptoms.  Review of Systems As per history of present illness, otherwise negative  Current Medications, Allergies, Past Medical History, Past Surgical History, Family History and Social History were reviewed in [employer] record.     Objective:   Physical Exam There were no vitals taken for this visit. Constitutional: Well-developed and well-nourished. No distress. HEENT: Normocephalic and atraumatic. Conjunctivae are normal.  No scleral icterus. Neck: Neck supple. Trachea midline. Cardiovascular: Normal rate, regular rhythm and intact distal pulses. No M/R/G Pulmonary/chest: Effort normal and breath sounds normal. No wheezing, rales or rhonchi. Abdominal: Soft, nontender, nondistended. Bowel sounds active throughout. There are no masses palpable. No hepatosplenomegaly. Extremities: no clubbing, cyanosis, or edema Neurological: Alert and oriented to person place and time. Skin: Skin is warm and dry. Psychiatric: Normal mood and affect. Behavior is normal.  Labs  from [place], reviewed    Assessment & Plan:  76 year old female with a history of iron deficiency anemia, GERD and chronic constipation who is here for follow-up  1. Chronic constipation -- doing well with MiraLAX 17 g daily. No alarm symptoms. Continue MiraLAX 17 g daily. 90 day supply  2. GERD -- well-controlled on low-dose omeprazole. Continue omeprazole 20 mg daily. No alarm symptoms. Previous attempt to discontinue PPI has failed due to recurrent symptoms.  3. CRC screening -- normal colonoscopy 2013. Ten-year recall but will discuss at that time if she will be near age 66.  12-18 month follow-up 15 minutes spent with the patient today. Greater than 50% was spent in counseling and coordination of care with the patient

## 2017-03-10 NOTE — Patient Instructions (Signed)
We have sent the following medications to your pharmacy for you to pick up at your convenience: Miralax Prilosec  Please follow up with Dr Hilarie Fredrickson in 12-18 months.  If you are age 74 or older, your body mass index should be between 23-30. Your Body mass index is 27.98 kg/m. If this is out of the aforementioned range listed, please consider follow up with your Primary Care Provider.  If you are age 50 or younger, your body mass index should be between 19-25. Your Body mass index is 27.98 kg/m. If this is out of the aformentioned range listed, please consider follow up with your Primary Care Provider.

## 2017-03-13 NOTE — Progress Notes (Signed)
Patient ID: Alyssa Nolan, female   DOB: August 02, 1941, 76 y.o.   MRN: [id number] ANNUAL PREVENTATIVE CARE GYN  ENCOUNTER NOTE  Subjective:       Alyssa Nolan is a 76 y.o. No obstetric history on file. female here for a routine annual gynecologic exam.  Current complaints: 1. Annual Medicare physical 2. follow up on lichen planus  3. osteopenia. 4. Vaginal atrophy       Patient describes occasional vulvar puritius and irritation associated with lichen planus, well controlled with Temovate.patient uses as needed. She reports taking vitamin D, calcium supplements and engages in regular exercise. She complains of regular menopausal symptoms including hot flashes and vaginal dryness but feels they are managable without medication.  Denies vaginal/pelvic pain or discharge. Also states that she has occasional urinary urgency and intermittent dribbling but states that this is rare and isn't bothering her much at this pont. Denies change in bowel movements.   Gynecologic History No LMP recorded. Patient is postmenopausal. Contraception: post menopausal status Last Pap: Last pap was normal. Needed today due to diagnoses of breast cancer.  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 Results were:   Obstetric History OB History  Gravida Para Term Preterm AB Living  2 2          SAB TAB Ectopic Multiple Live Births               # Outcome Date GA Lbr Len/2nd Weight Sex Delivery Anes PTL Lv  2 Para           1 Para             Obstetric Comments  1st Menstrual Cycle:  14  1st Pregnancy:  30    Past Medical History:  Diagnosis Date  . Arthritis 2011   R hip injection per Dr. Nelva Bush  . Breast cancer ([place]) 04/28/2016   T1c, N0; ER/PR positive, HER-2/neu negative. INVASIVE DUCTAL CARCINOMA., DCIS  . GERD (gastroesophageal reflux  disease)   . History of chickenpox   . Hyperlipidemia   . Hypertension   . Lichen planus   . Osteopenia    Tscore -1, consider repeat in ~2013, per Dr. Amalia Hailey with gyn  . 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 BASAL CELL CARCINOMA EXCISION  01-22-14   chest area. Dr Nehemiah Massed  . BREAST BIOPSY Right 2013   CORE - NEG, cyst with ductal hyperplasia without atypia Dr Bary Castilla  . BREAST BIOPSY Right 04-28-16   Invasive ductal carcinoma  . BREAST CYST ASPIRATION Bilateral   . BREAST EXCISIONAL BIOPSY Right 1968   NEG  . BREAST LUMPECTOMY WITH NEEDLE LOCALIZATION Right 06/10/2016   Procedure: BREAST LUMPECTOMY WITH NEEDLE LOCALIZATION;  Surgeon: Robert Bellow, MD;  Location: [facility] ORS;  Service: General;  Laterality: Right;  . BREAST LUMPECTOMY WITH SENTINEL LYMPH NODE BIOPSY Right 06/10/2016   Procedure: BREAST LUMPECTOMY WITH SENTINEL LYMPH NODE BX / WITH RECONSTRUCTION;  Surgeon: Robert Bellow, MD;  Location: [facility] ORS;  Service: General;  Laterality: Right;  . BREAST SURGERY  1968   Fibroadenoma  . CARDIOVASCULAR STRESS TEST  02/07/2002  . [place]  . CHOLECYSTECTOMY  2005   Dr Helane Rima, [place]  . COLONOSCOPY  04-22-02  Dr Ricarda Frame, [place]  . COLONOSCOPY  09-02-12   Dr Bary Castilla  . Endometrial ablation and right ovary removal  2002  . MRI, hip  01/12/2004  . MRI, neck  06/23/1998  10/31/2003  . OOPHORECTOMY Bilateral 15+ YRS AGO  . Removal of excess eyelids and shortening of eyelid tendons  01/2010  . 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 Right hip injection  03/30/2009  . Touch Up of excess eyelids  04/20/2010    Current Outpatient Prescriptions on File Prior to Visit  Medication Sig Dispense Refill  . atorvastatin (LIPITOR) 20 MG tablet Take 1 tablet (20 mg total) by mouth daily. 90 tablet 2  . Calcium Carbonate-Vitamin D (CALCIUM 600/VITAMIN D) 600-400 MG-UNIT per tablet Take 1 tablet by mouth  2 (two) times daily.     . carisoprodol (SOMA) 350 MG tablet TAKE 1 TABLET BY MOUTH AT BEDTIME 90 tablet 1  . clobetasol ointment (TEMOVATE) 5.46 % Apply 1 application topically every other day. (Patient taking differently: Apply 1 application topically as needed. ) 30 g 1  . letrozole (FEMARA) 2.5 MG tablet Take 1 tablet (2.5 mg total) by mouth daily. 90 tablet 3  . loratadine (CLARITIN) 10 MG tablet Take 10 mg by mouth daily.      . meloxicam (MOBIC) 15 MG tablet Take 15 mg by mouth as needed for pain.    Marland Kitchen omeprazole (PRILOSEC) 20 MG capsule Take 1 capsule (20 mg total) by mouth daily. 90 capsule 3  . polyethylene glycol powder (GLYCOLAX/MIRALAX) powder TAKE 1 CAPFUL(17G) BY MOUTH DAILY 1530 g 3  . Propylene Glycol (SYSTANE BALANCE OP) Place 1 drop into both eyes daily.    . traMADol-acetaminophen (ULTRACET) 37.5-325 MG tablet TAKE 2 TABLETS BY MOUTH AT BEDTIME 180 tablet 1  . traZODone (DESYREL) 100 MG tablet Take 1.5 tablets (150 mg total) by mouth at bedtime. 135 tablet 3  . triamterene-hydrochlorothiazide (DYAZIDE) 37.5-25 MG capsule Take 1 each (1 capsule total) by mouth every morning. 90 capsule 3   No current facility-administered medications on file prior to visit.     Allergies  Allergen Reactions  . Codeine     REACTION: Felt like she was going to "pass out"    Social History   Social History  . Marital status: Married    Spouse name: N/A  . Number of children: 2  . Years of education: N/A   Occupational History  . Retired since 2007 and moved to [employer] Retired   Social History Main Topics  . Smoking status: Former Smoker    Types: Cigarettes    Quit date: 11/14/1969  . Smokeless tobacco: Never Used  . Alcohol use Yes     Comment: Rare  . Drug use: No  . Sexual activity: Not Currently    Birth control/ protection: Post-menopausal   Other Topics Concern  . Not on file   Social History Narrative   From [place]:  [employer]   2 kids in  [employer] and likes to knit    Family History  Problem Relation Age of Onset  . COPD Mother     Smoker  . Heart disease Father     Possible CHF  . Stroke Maternal Grandfather 83    CVA  . Colon cancer      mother's 1/2 sister  . Breast cancer Neg Hx   . Diabetes Neg Hx  The following portions of the patient's history were reviewed and updated as appropriate: allergies, current medications, past family history, past medical history, past social history, past surgical history and problem list.  Review of Systems Review of Systems  Constitutional: Negative.  Negative for chills, fever and malaise/fatigue.  HENT: Negative.  Negative for congestion and ear pain.   Eyes: Negative.   Respiratory: Negative for cough and shortness of breath.   Cardiovascular: Negative for chest pain, palpitations, claudication and leg swelling.  Gastrointestinal: Negative for abdominal pain, constipation, nausea and vomiting.  Genitourinary: Positive for urgency. Negative for dysuria and frequency.  Skin: Negative.   Neurological: Negative for headaches.    Objective:   BP (!) 144/70   Pulse 83   Ht 5' 4"  (1.626 m)   Wt 164 lb (74.4 kg)   BMI 28.15 kg/m   CONSTITUTIONAL: Well-developed, well-nourished female in no acute distress.  PSYCHIATRIC: Normal mood and affect. Normal behavior. Normal judgment and thought content. [place]: Alert. Normal muscle tone coordination. HENT:  Normocephalic, atraumatic Oropharynx is clear and moist EYES: Conjunctivae and EOM are normal. . No scleral icterus.  NECK: Normal range of motion, supple, no masses.  Normal thyroid.  SKIN: Skin is warm, dry and appropriately thinned for age.  No rash noted. Not diaphoretic. No erythema. No pallor. CARDIOVASCULAR: Normal heart rate noted, regular rhythm, no murmur, rubs or gallops. RESPIRATORY: Clear to auscultation bilaterally. Effort and breath sounds normal, no problems with respiration  noted. BREASTS: Symmetric in size. No masses, skin changes, nipple drainage, or lymphadenopathy. Post right breast lumpectomy 05/2016. Incision clean, dry, non-tender, with No significant induration.  ABDOMEN: Soft, normal bowel sounds, no distention noted.  No tenderness, rebound or guarding.  BLADDER: Normal PELVIC:  External Genitalia: mild vulvar hyperemia. Hypopigmentation of labia majora consistent with lichen planus. No erosions or epithelial skin breakdown.   BUS: Normal  Vagina: moderate - severe atrophy  Cervix: normal, non-tender; no cervical motion  Uterus: normal, no masses, appropriate size and shape  Adnexa: Normal; nonpalpable nontender  RV: External Exam NormaI, No Rectal Masses and Normal Sphincter tone  MUSCULOSKELETAL:  No cyanosis, or edema.  2+ distal pulses. LYMPHATIC: No Axillary or Supraclavicular Adenopathy.    Assessment:   Annual gynecologic examination 76 y.o. Contraception: post menopausal status  BMI 28 Lichen planus, stable on Temovate ointment 1-3 times per week. Osteopenia History of breast cancer, status post lumpectomy and radiation therapy Vaginal atrophy, not candidate for vaginal estrogen cream at this time    Plan:  Pap today Mammogram: scheduled for 04/2017 thru dr byrnett Stool Guaiac Testing:  Ordered Labs: [place] PCP Routine preventative health maintenance measures emphasized: Daily use of calcium and vitamin D supplements,, Exercise/Diet/Weight control, Tobacco Warnings, Alcohol/Substance use risks and Stress Management Continue with Temovate ointment 0.05% 1-3 times weekly for lichen planus  Return to [place], [place], PA-S [employer] 03/15/2017 Brayton Mars, MD     I have seen, interviewed, and examined the patient in conjunction with the [facility].A. student and affirm the diagnosis and management plan. Sheree Lalla A. Roxana Lai, MD, FACOG   Note: This dictation was  prepared with Dragon dictation along with smaller phrase technology. Any transcriptional errors that result from this process are unintentional.

## 2017-03-15 ENCOUNTER — Encounter: Payer: Self-pay | Admitting: Obstetrics and Gynecology

## 2017-03-15 ENCOUNTER — Ambulatory Visit (INDEPENDENT_AMBULATORY_CARE_PROVIDER_SITE_OTHER): Payer: Medicare Other | Admitting: Obstetrics and Gynecology

## 2017-03-15 VITALS — BP 144/70 | HR 83 | Ht 64.0 in | Wt 164.0 lb

## 2017-03-15 DIAGNOSIS — Z1231 Encounter for screening mammogram for malignant neoplasm of breast: Secondary | ICD-10-CM

## 2017-03-15 DIAGNOSIS — L439 Lichen planus, unspecified: Secondary | ICD-10-CM

## 2017-03-15 DIAGNOSIS — Z1211 Encounter for screening for malignant neoplasm of colon: Secondary | ICD-10-CM

## 2017-03-15 DIAGNOSIS — D0511 Intraductal carcinoma in situ of right breast: Secondary | ICD-10-CM | POA: Diagnosis not present

## 2017-03-15 DIAGNOSIS — M8580 Other specified disorders of bone density and structure, unspecified site: Secondary | ICD-10-CM | POA: Diagnosis not present

## 2017-03-15 DIAGNOSIS — N952 Postmenopausal atrophic vaginitis: Secondary | ICD-10-CM | POA: Diagnosis not present

## 2017-03-15 DIAGNOSIS — Z1239 Encounter for other screening for malignant neoplasm of breast: Secondary | ICD-10-CM

## 2017-03-15 MED ORDER — CLOBETASOL PROPIONATE 0.05 % EX OINT: 1.0000 "application " | TOPICAL_OINTMENT | CUTANEOUS | 2 refills | Status: DC

## 2017-03-15 NOTE — Patient Instructions (Signed)
1. Pap smear is performed due to diagnosis of breast cancer 2. Follow-up mammogram is through Dr. Elmer Ramp and Dr. Aggie Cosier 3. Stool guaiac cards are given for colon cancer screening 4. Screening labs are obtained through primary care 5. Continue with calcium and vitamin D supplementation daily 6. Return in 1 year for annual exam   Health Maintenance for Postmenopausal Women Menopause is a normal process in which your reproductive ability comes to an end. This process happens gradually over a span of months to years, usually between the ages of 38 and 40. Menopause is complete when you have missed 12 consecutive menstrual periods. It is important to talk with your health care provider about some of the most common conditions that affect postmenopausal women, such as heart disease, cancer, and bone loss (osteoporosis). Adopting a healthy lifestyle and getting preventive care can help to promote your health and wellness. Those actions can also lower your chances of developing some of these common conditions. What should I know about menopause? During menopause, you may experience a number of symptoms, such as:  Moderate-to-severe hot flashes.  Night sweats.  Decrease in sex drive.  Mood swings.  Headaches.  Tiredness.  Irritability.  Memory problems.  Insomnia. Choosing to treat or not to treat menopausal changes is an individual decision that you make with your health care provider. What should I know about hormone replacement therapy and supplements? Hormone therapy products are effective for treating symptoms that are associated with menopause, such as hot flashes and night sweats. Hormone replacement carries certain risks, especially as you become older. If you are thinking about using estrogen or estrogen with progestin treatments, discuss the benefits and risks with your health care provider. What should I know about heart disease and stroke? Heart disease, heart attack,  and stroke become more likely as you age. This may be due, in part, to the hormonal changes that your body experiences during menopause. These can affect how your body processes dietary fats, triglycerides, and cholesterol. Heart attack and stroke are both medical emergencies. There are many things that you can do to help prevent heart disease and stroke:  Have your blood pressure checked at least every 1-2 years. High blood pressure causes heart disease and increases the risk of stroke.  If you are 56-60 years old, ask your health care provider if you should take aspirin to prevent a heart attack or a stroke.  Do not use any tobacco products, including cigarettes, chewing tobacco, or electronic cigarettes. If you need help quitting, ask your health care provider.  It is important to eat a healthy diet and maintain a healthy weight.  Be sure to include plenty of vegetables, fruits, low-fat dairy products, and lean protein.  Avoid eating foods that are high in solid fats, added sugars, or salt (sodium).  Get regular exercise. This is one of the most important things that you can do for your health.  Try to exercise for at least 150 minutes each week. The type of exercise that you do should increase your heart rate and make you sweat. This is known as moderate-intensity exercise.  Try to do strengthening exercises at least twice each week. Do these in addition to the moderate-intensity exercise.  Know your numbers.Ask your health care provider to check your cholesterol and your blood glucose. Continue to have your blood tested as directed by your health care provider. What should I know about cancer screening? There are several types of cancer. Take the following steps  to reduce your risk and to catch any cancer development as early as possible. Breast Cancer  Practice breast self-awareness.  This means understanding how your breasts normally appear and feel.  It also means doing regular  breast self-exams. Let your health care provider know about any changes, no matter how small.  If you are 40 or older, have a clinician do a breast exam (clinical breast exam or CBE) every year. Depending on your age, family history, and medical history, it may be recommended that you also have a yearly breast X-ray (mammogram).  If you have a family history of breast cancer, talk with your health care provider about genetic screening.  If you are at high risk for breast cancer, talk with your health care provider about having an MRI and a mammogram every year.  Breast cancer (BRCA) gene test is recommended for women who have family members with BRCA-related cancers. Results of the assessment will determine the need for genetic counseling and BRCA1 and for BRCA2 testing. BRCA-related cancers include these types:  Breast. This occurs in males or females.  Ovarian.  Tubal. This may also be called fallopian tube cancer.  Cancer of the abdominal or pelvic lining (peritoneal cancer).  Prostate.  Pancreatic. Cervical, Uterine, and Ovarian Cancer  Your health care provider may recommend that you be screened regularly for cancer of the pelvic organs. These include your ovaries, uterus, and vagina. This screening involves a pelvic exam, which includes checking for microscopic changes to the surface of your cervix (Pap test).  For women ages 21-65, health care providers may recommend a pelvic exam and a Pap test every three years. For women ages 18-65, they may recommend the Pap test and pelvic exam, combined with testing for human papilloma virus (HPV), every five years. Some types of HPV increase your risk of cervical cancer. Testing for HPV may also be done on women of any age who have unclear Pap test results.  Other health care providers may not recommend any screening for nonpregnant women who are considered low risk for pelvic cancer and have no symptoms. Ask your health care provider if a  screening pelvic exam is right for you.  If you have had past treatment for cervical cancer or a condition that could lead to cancer, you need Pap tests and screening for cancer for at least 20 years after your treatment. If Pap tests have been discontinued for you, your risk factors (such as having a new sexual partner) need to be reassessed to determine if you should start having screenings again. Some women have medical problems that increase the chance of getting cervical cancer. In these cases, your health care provider may recommend that you have screening and Pap tests more often.  If you have a family history of uterine cancer or ovarian cancer, talk with your health care provider about genetic screening.  If you have vaginal bleeding after reaching menopause, tell your health care provider.  There are currently no reliable tests available to screen for ovarian cancer. Lung Cancer  Lung cancer screening is recommended for adults 93-66 years old who are at high risk for lung cancer because of a history of smoking. A yearly low-dose CT scan of the lungs is recommended if you:  Currently smoke.  Have a history of at least 30 pack-years of smoking and you currently smoke or have quit within the past 15 years. A pack-year is smoking an average of one pack of cigarettes per day for one  year. Yearly screening should:  Continue until it has been 15 years since you quit.  Stop if you develop a health problem that would prevent you from having lung cancer treatment. Colorectal Cancer  This type of cancer can be detected and can often be prevented.  Routine colorectal cancer screening usually begins at age 3 and continues through age 44.  If you have risk factors for colon cancer, your health care provider may recommend that you be screened at an earlier age.  If you have a family history of colorectal cancer, talk with your health care provider about genetic screening.  Your health care  provider may also recommend using home test kits to check for hidden blood in your stool.  A small camera at the end of a tube can be used to examine your colon directly (sigmoidoscopy or colonoscopy). This is done to check for the earliest forms of colorectal cancer.  Direct examination of the colon should be repeated every 5-10 years until age 57. However, if early forms of precancerous polyps or small growths are found or if you have a family history or genetic risk for colorectal cancer, you may need to be screened more often. Skin Cancer  Check your skin from head to toe regularly.  Monitor any moles. Be sure to tell your health care provider:  About any new moles or changes in moles, especially if there is a change in a mole's shape or color.  If you have a mole that is larger than the size of a pencil eraser.  If any of your family members has a history of skin cancer, especially at a young age, talk with your health care provider about genetic screening.  Always use sunscreen. Apply sunscreen liberally and repeatedly throughout the day.  Whenever you are outside, protect yourself by wearing long sleeves, pants, a wide-brimmed hat, and sunglasses. What should I know about osteoporosis? Osteoporosis is a condition in which bone destruction happens more quickly than new bone creation. After menopause, you may be at an increased risk for osteoporosis. To help prevent osteoporosis or the bone fractures that can happen because of osteoporosis, the following is recommended:  If you are 13-74 years old, get at least 1,000 mg of calcium and at least 600 mg of vitamin D per day.  If you are older than age 68 but younger than age 26, get at least 1,200 mg of calcium and at least 600 mg of vitamin D per day.  If you are older than age 15, get at least 1,200 mg of calcium and at least 800 mg of vitamin D per day. Smoking and excessive alcohol intake increase the risk of osteoporosis. Eat foods  that are rich in calcium and vitamin D, and do weight-bearing exercises several times each week as directed by your health care provider. What should I know about how menopause affects my mental health? Depression may occur at any age, but it is more common as you become older. Common symptoms of depression include:  Low or sad mood.  Changes in sleep patterns.  Changes in appetite or eating patterns.  Feeling an overall lack of motivation or enjoyment of activities that you previously enjoyed.  Frequent crying spells. Talk with your health care provider if you think that you are experiencing depression. What should I know about immunizations? It is important that you get and maintain your immunizations. These include:  Tetanus, diphtheria, and pertussis (Tdap) booster vaccine.  Influenza every year before the  flu season begins.  Pneumonia vaccine.  Shingles vaccine. Your health care provider may also recommend other immunizations. This information is not intended to replace advice given to you by your health care provider. Make sure you discuss any questions you have with your health care provider. Document Released: 12/23/2005 Document Revised: 05/20/2016 Document Reviewed: 08/04/2015 Elsevier Interactive Patient Education  2017 [employer].

## 2017-03-17 LAB — PAP IG W/ RFLX HPV ASCU: PAP SMEAR COMMENT: 0

## 2017-03-21 DIAGNOSIS — Z1211 Encounter for screening for malignant neoplasm of colon: Secondary | ICD-10-CM | POA: Diagnosis not present

## 2017-03-24 LAB — FECAL OCCULT BLOOD, IMMUNOCHEMICAL: FECAL OCCULT BLD: NEGATIVE

## 2017-04-28 ENCOUNTER — Other Ambulatory Visit: Payer: Medicare Other

## 2017-05-02 ENCOUNTER — Ambulatory Visit: Payer: Medicare Other | Admitting: General Surgery

## 2017-05-09 ENCOUNTER — Ambulatory Visit
Admission: RE | Admit: 2017-05-09 | Discharge: 2017-05-09 | Disposition: A | Discharge status: Home / Self Care | Payer: Medicare Other | Source: Ambulatory Visit | Attending: General Surgery | Admitting: General Surgery

## 2017-05-09 ENCOUNTER — Other Ambulatory Visit: Payer: Self-pay | Admitting: General Surgery

## 2017-05-09 DIAGNOSIS — R928 Other abnormal and inconclusive findings on diagnostic imaging of breast: Secondary | ICD-10-CM | POA: Diagnosis not present

## 2017-05-09 DIAGNOSIS — C50411 Malignant neoplasm of upper-outer quadrant of right female breast: Secondary | ICD-10-CM

## 2017-05-09 DIAGNOSIS — Z853 Personal history of malignant neoplasm of breast: Secondary | ICD-10-CM | POA: Insufficient documentation

## 2017-05-10 IMAGING — MG 2D DIGITAL DIAGNOSTIC UNILATERAL RIGHT MAMMOGRAM WITH CAD AND AD
6 series · 6 of 14 positions shown · non-contrast
Comparison: Previous exam(s).

CLINICAL DATA: Status post stereotactic core biopsy right breast

EXAM:
DIAGNOSTIC RIGHT MAMMOGRAM POST ULTRASOUND BIOPSY

[R CC]
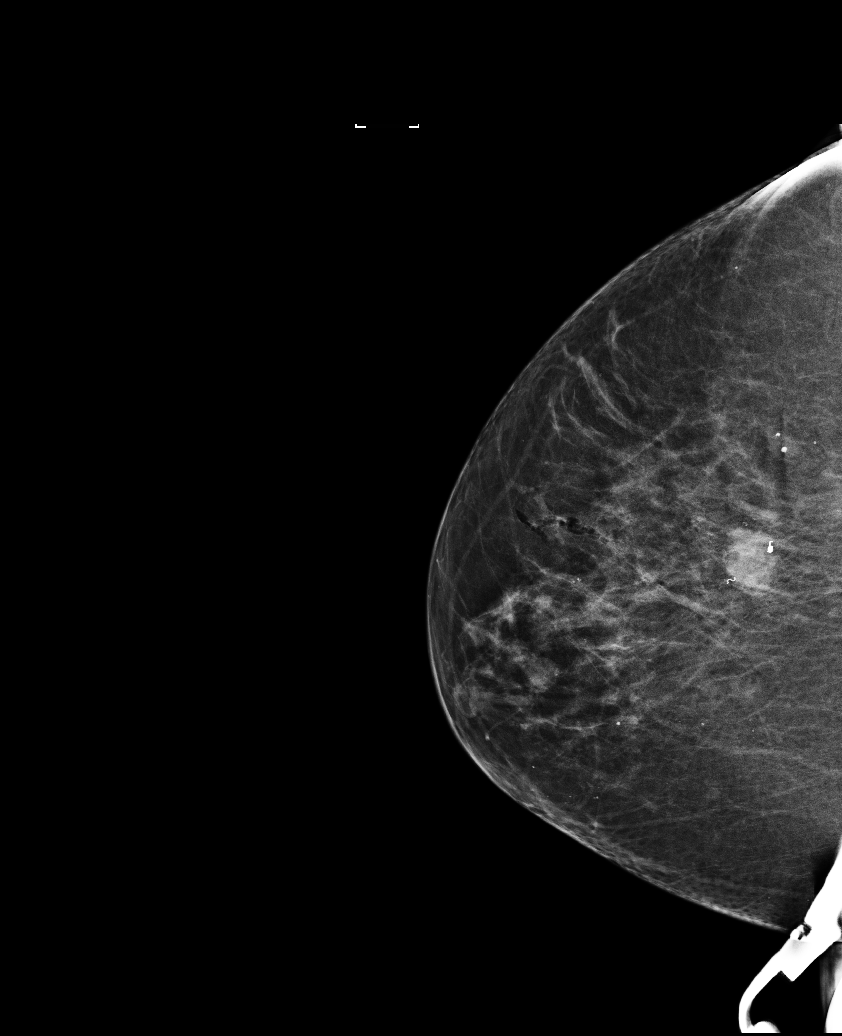

[R ML]
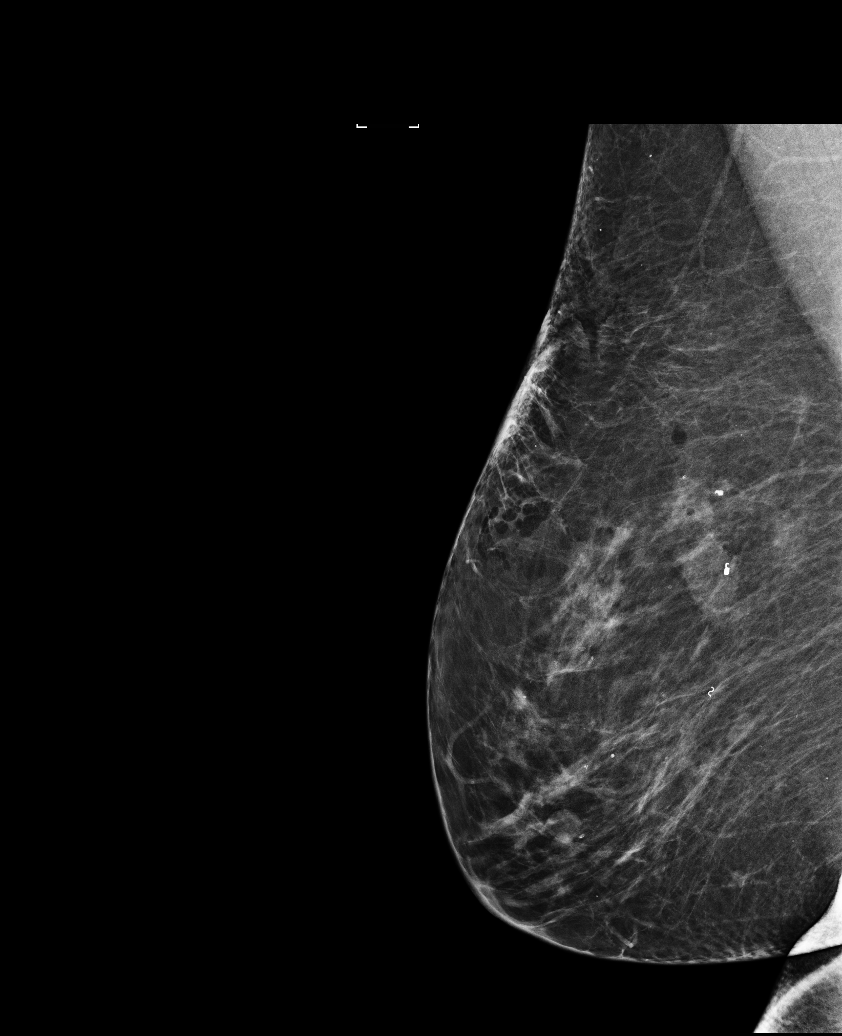

[R ML synth-2D]
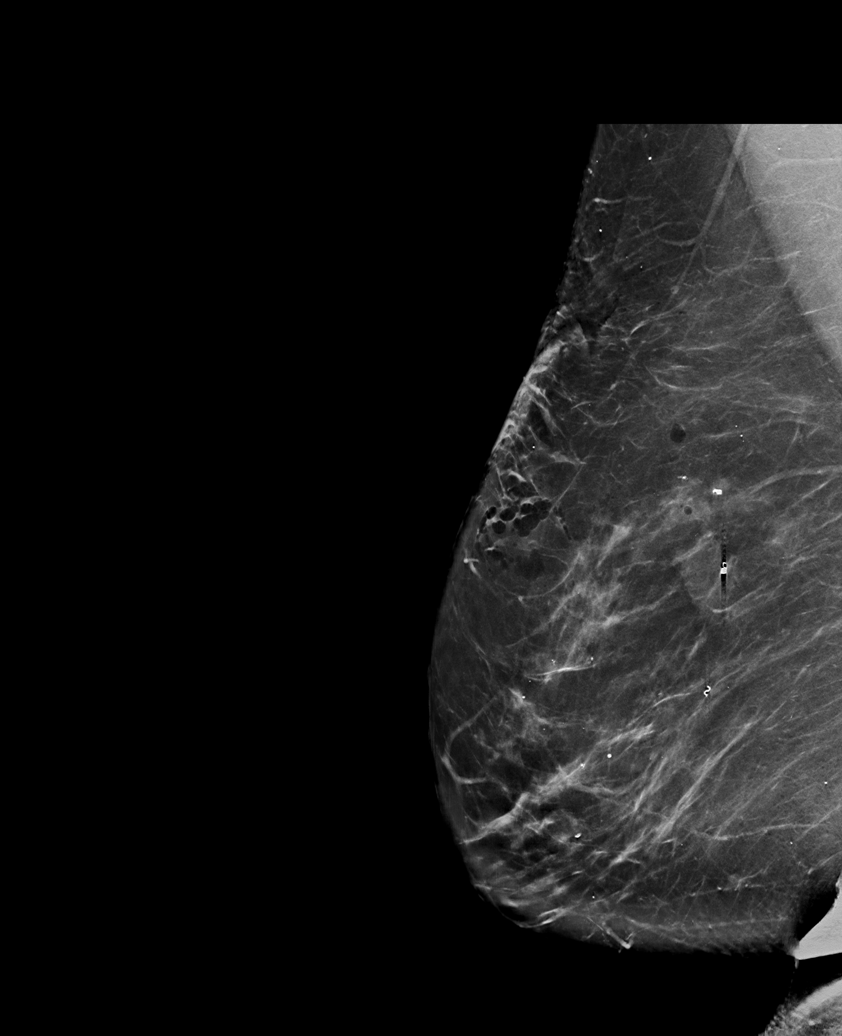

[R CC synth-2D]
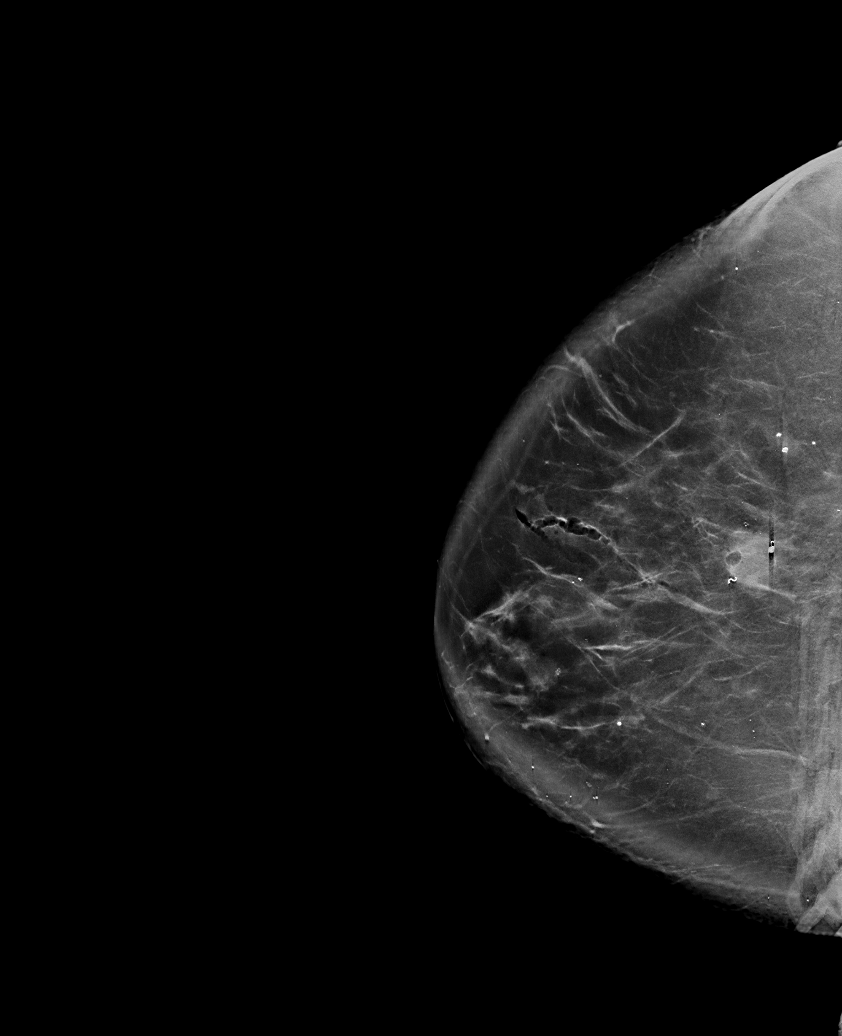

[R CC tomo · tomo slice 56/111.0]
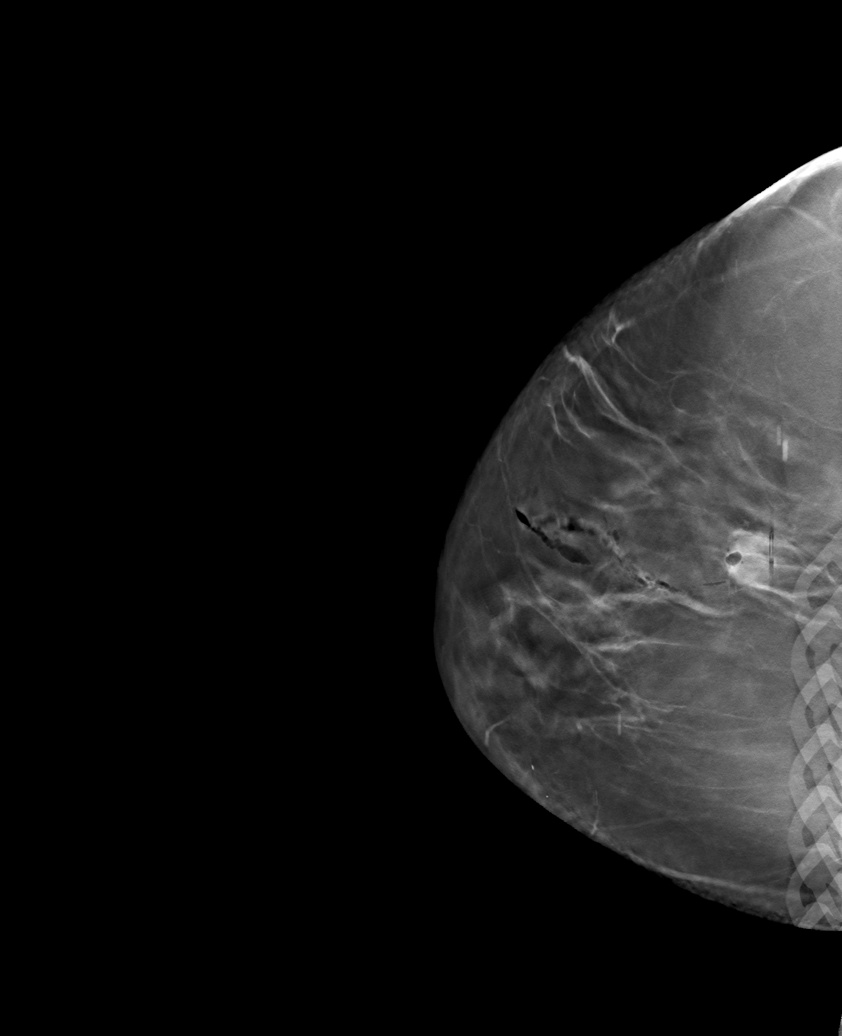

[R ML tomo · tomo slice 51/102.0]
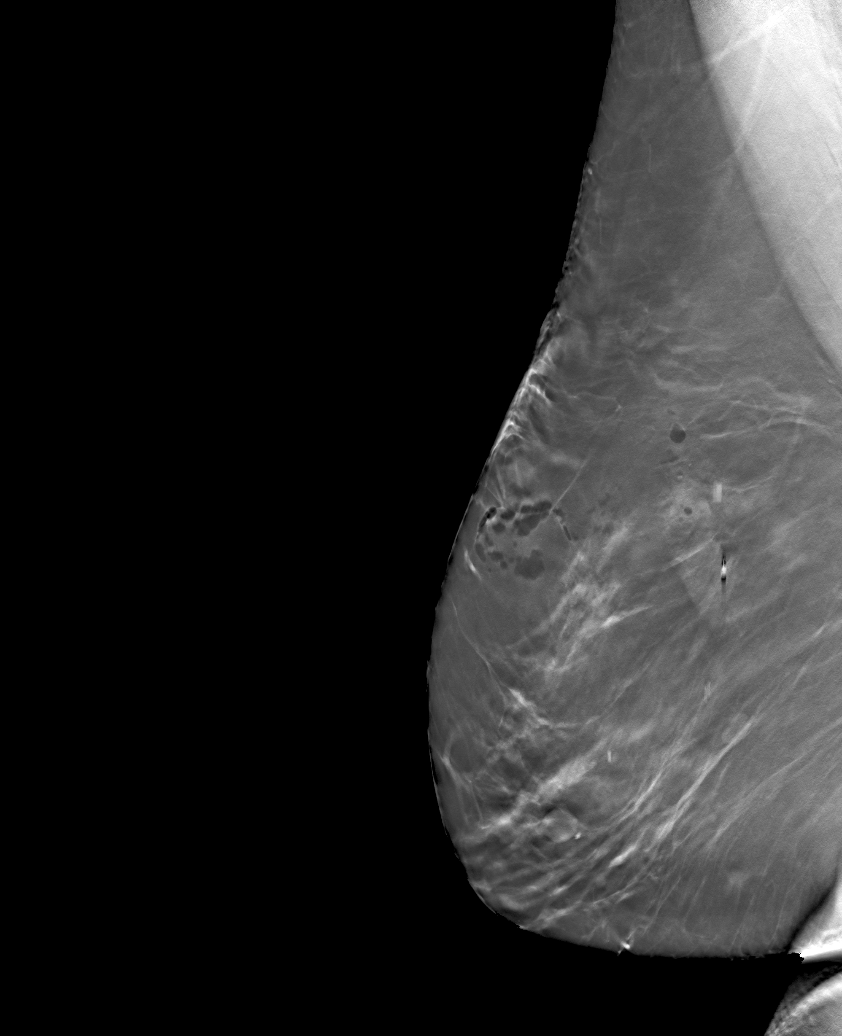

[6 of 14 positions shown; findings below may reference images not displayed]

FINDINGS: Mammographic images were obtained following 3D tomographic
stereotactic guided biopsy of architectural distortion
calcifications at the right breast 12 o'clock. Cc and lateral view
of the right breast demonstrate coil biopsy clip in the area of
concern.
IMPRESSION: Post biopsy mammogram demonstrating biopsy clip in the area of
concern.

Final Assessment: Post Procedure Mammograms for Marker Placement

## 2017-05-18 ENCOUNTER — Ambulatory Visit: Payer: Medicare Other | Admitting: General Surgery

## 2017-05-18 ENCOUNTER — Encounter: Payer: Self-pay | Admitting: General Surgery

## 2017-06-04 ENCOUNTER — Other Ambulatory Visit: Payer: Self-pay | Admitting: Family Medicine

## 2017-06-05 NOTE — Telephone Encounter (Signed)
Electronic refill request. Diclofenac Gel Last office visit:   12/05/16 Acute Last Filled:   ? Filled by OB/GYN ? Please advise.

## 2017-06-06 NOTE — Telephone Encounter (Signed)
Sent.  Okay to continue.  Thanks. °

## 2017-06-08 ENCOUNTER — Ambulatory Visit (INDEPENDENT_AMBULATORY_CARE_PROVIDER_SITE_OTHER): Payer: Medicare Other | Admitting: General Surgery

## 2017-06-08 ENCOUNTER — Encounter: Payer: Self-pay | Admitting: General Surgery

## 2017-06-08 VITALS — BP 122/62 | HR 88 | Resp 14 | Ht 64.0 in | Wt 161.0 lb

## 2017-06-08 DIAGNOSIS — C50411 Malignant neoplasm of upper-outer quadrant of right female breast: Secondary | ICD-10-CM | POA: Diagnosis not present

## 2017-06-08 MED ORDER — LETROZOLE 2.5 MG PO TABS: 2.5000 mg | ORAL_TABLET | Freq: Every day | ORAL | 3 refills | Status: DC

## 2017-06-08 NOTE — Patient Instructions (Signed)
The patient has been asked to return to the office in one year with a bilateral diagnostic mammogram.The patient is aware to call back for any questions or concerns. 

## 2017-06-08 NOTE — Progress Notes (Signed)
Patient ID: Alyssa Nolan, female   DOB: 07-04-1941, 76 y.o.   MRN: [id number]  Chief Complaint  Patient presents with  . Follow-up    mammogram     HPI Alyssa Nolan is a 76 y.o. female  who presents for a breast evaluation. The most recent mammogram was done on 04/28/2017.  Patient does perform regular self breast checks and gets regular mammograms done.    HPI  Past Medical History:  Diagnosis Date  . Arthritis 2011   R hip injection per Dr. Nelva Bush  . Breast cancer ([place]) 04/28/2016   T1c, N0; ER/PR positive, HER-2/neu negative. INVASIVE DUCTAL CARCINOMA., DCIS  . GERD (gastroesophageal reflux disease)   . History of chickenpox   . Hyperlipidemia   . Hypertension   . Lichen planus   . Osteopenia    Tscore -1, consider repeat in ~2013, per Dr. Amalia Hailey with gyn  . 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 BASAL CELL CARCINOMA EXCISION  01-22-14   chest area. Dr Nehemiah Massed  . BREAST BIOPSY Right 2013   CORE - NEG, cyst with ductal hyperplasia without atypia Dr Bary Castilla  . BREAST BIOPSY Right 04-28-16   Invasive ductal carcinoma  . BREAST CYST ASPIRATION Bilateral   . BREAST EXCISIONAL BIOPSY Right 1968   NEG  . BREAST LUMPECTOMY Right 2017  . BREAST LUMPECTOMY WITH NEEDLE LOCALIZATION Right 06/10/2016   Procedure: BREAST LUMPECTOMY WITH NEEDLE LOCALIZATION;  Surgeon: Robert Bellow, MD;  Location: [facility] ORS;  Service: General;  Laterality: Right;  . BREAST LUMPECTOMY WITH SENTINEL LYMPH NODE BIOPSY Right 06/10/2016   Procedure: BREAST LUMPECTOMY WITH SENTINEL LYMPH NODE BX / WITH RECONSTRUCTION;  Surgeon: Robert Bellow, MD;  Location: [facility] ORS;  Service: General;  Laterality: Right;  . BREAST SURGERY  1968   Fibroadenoma  . CARDIOVASCULAR STRESS TEST  02/07/2002  . [place]  . CHOLECYSTECTOMY  2005   Dr Helane Rima, [place]  . COLONOSCOPY  04-22-02   Dr Ricarda Frame, [place]  . COLONOSCOPY  09-02-12   Dr Bary Castilla    . Endometrial ablation and right ovary removal  2002  . MRI, hip  01/12/2004  . MRI, neck  06/23/1998  10/31/2003  . OOPHORECTOMY Bilateral 15+ YRS AGO  . Removal of excess eyelids and shortening of eyelid tendons  01/2010  . 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 Right hip injection  03/30/2009  . Touch Up of excess eyelids  04/20/2010    Family History  Problem Relation Age of Onset  . COPD Mother        Smoker  . Heart disease Father        Possible CHF  . Stroke Maternal Grandfather 83       CVA  . Colon cancer Unknown        mother's 1/2 sister  . Breast cancer Neg Hx   . Diabetes Neg Hx     Social History Social History  Substance Use Topics  . Smoking status: Former Smoker    Types: Cigarettes    Quit date: 11/14/1969  . Smokeless tobacco: Never Used  . Alcohol use Yes     Comment: Rare    Allergies  Allergen Reactions  . Codeine     REACTION: Felt like she was going to "pass out"    Current Outpatient Prescriptions  Medication Sig Dispense  Refill  . atorvastatin (LIPITOR) 20 MG tablet Take 1 tablet (20 mg total) by mouth daily. 90 tablet 2  . Calcium Carbonate-Vitamin D (CALCIUM 600/VITAMIN D) 600-400 MG-UNIT per tablet Take 1 tablet by mouth 2 (two) times daily.     . carisoprodol (SOMA) 350 MG tablet TAKE 1 TABLET BY MOUTH AT BEDTIME 90 tablet 1  . clobetasol ointment (TEMOVATE) 7.98 % Apply 1 application topically every other day. 30 g 2  . diclofenac sodium (VOLTAREN) 1 % GEL APPLY 2G TOPICALLY FOUR TIMES DAILY 100 g 3  . loratadine (CLARITIN) 10 MG tablet Take 10 mg by mouth daily.      . meloxicam (MOBIC) 15 MG tablet Take 15 mg by mouth as needed for pain.    Marland Kitchen omeprazole (PRILOSEC) 20 MG capsule Take 1 capsule (20 mg total) by mouth daily. 90 capsule 3  . polyethylene glycol powder (GLYCOLAX/MIRALAX) powder TAKE 1 CAPFUL(17G) BY MOUTH DAILY 1530 g 3  . Propylene Glycol (SYSTANE BALANCE OP) Place 1 drop into both eyes daily.     . traMADol-acetaminophen (ULTRACET) 37.5-325 MG tablet TAKE 2 TABLETS BY MOUTH AT BEDTIME 180 tablet 1  . traZODone (DESYREL) 100 MG tablet Take 1.5 tablets (150 mg total) by mouth at bedtime. 135 tablet 3  . triamterene-hydrochlorothiazide (DYAZIDE) 37.5-25 MG capsule Take 1 each (1 capsule total) by mouth every morning. 90 capsule 3  . vitamin E 100 UNIT capsule Take by mouth daily.    Marland Kitchen letrozole (FEMARA) 2.5 MG tablet Take 1 tablet (2.5 mg total) by mouth daily. 90 tablet 3   No current facility-administered medications for this visit.     Review of Systems Review of Systems  Constitutional: Negative.   Respiratory: Negative.   Cardiovascular: Negative.     Blood pressure 122/62, pulse 88, resp. rate 14, height 5' 4"  (1.626 m), weight 161 lb (73 kg).  Physical Exam Physical Exam  Constitutional: She is oriented to person, place, and time. She appears well-developed and well-nourished.  Eyes: Conjunctivae are normal. No scleral icterus.  Neck: Neck supple.  Cardiovascular: Normal rate, regular rhythm and normal heart sounds.   Pulmonary/Chest: Effort normal and breath sounds normal. Right breast exhibits no inverted nipple, no mass, no nipple discharge, no skin change and no tenderness. Left breast exhibits no inverted nipple, no mass, no nipple discharge, no skin change and no tenderness.    Lymphadenopathy:    She has no cervical adenopathy.    She has no axillary adenopathy.  Neurological: She is alert and oriented to person, place, and time.  Skin: Skin is warm and dry.    Data Reviewed 05/09/2017 bilateral diagnostic mammograms were reviewed. No interval change. Postsurgical changes on the right.  BI-RADS-2.  Assessment    Benign breast exam. Good tolerance of antiestrogen therapy.    Plan         The patient has been asked to return to the office in one year with a bilateral diagnostic mammogram. Sent [facility] to [facility] .  The patient is aware to call back for  any questions or concerns.   HPI, Physical Exam, Assessment and Plan have been scribed under the direction and in the presence of Hervey Ard, MD.  Gaspar Cola, CMA  I have completed the exam and reviewed the above documentation for accuracy and completeness.  I agree with the above.  Haematologist has been used and any errors in dictation or transcription are unintentional.  Hervey Ard, M.D., F.A.C.S.   Haron Beilke,  [place] Gleason 06/09/2017, 7:07 PM

## 2017-06-22 IMAGING — MG MM BREAST SURGICAL SPECIMEN
1 series · 1 of 1 positions shown · non-contrast
Comparison: Previous exam(s).

CLINICAL DATA: Post right breast lumpectomy.

EXAM:
SPECIMEN RADIOGRAPH OF THE RIGHT BREAST

[R SPECIMEN]
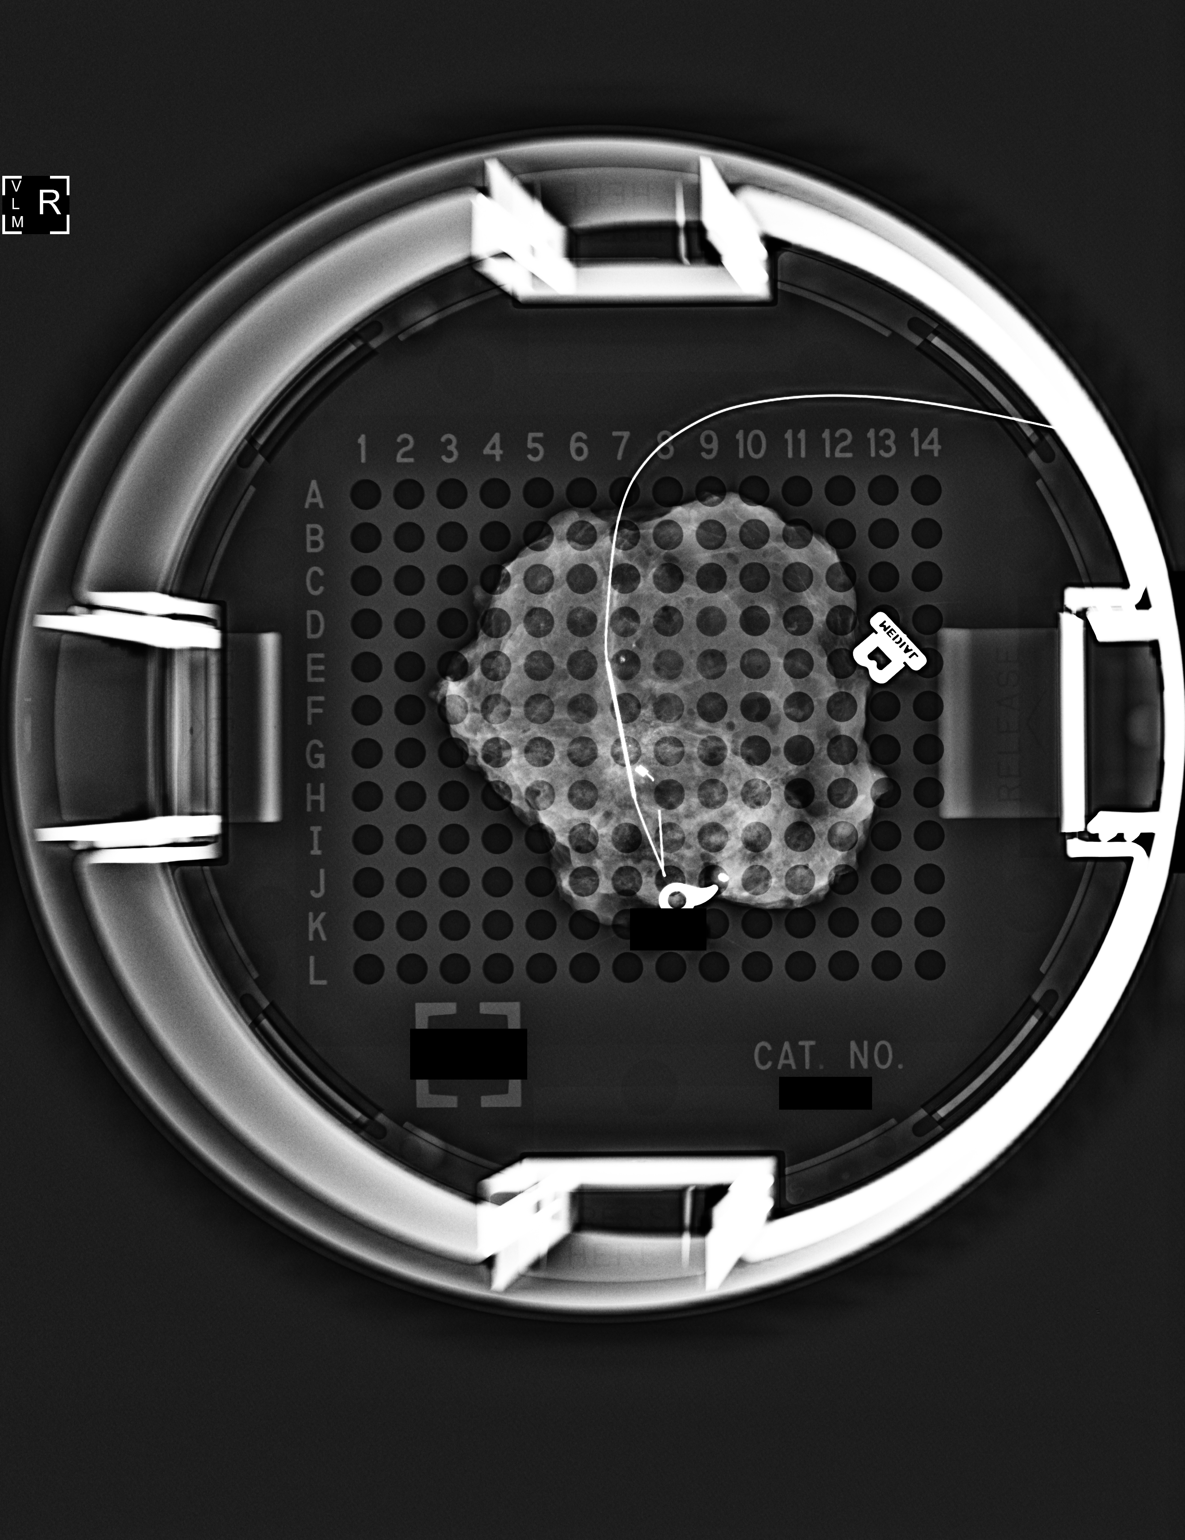

[1 of 1 positions shown; findings below may reference images not displayed]

FINDINGS: Status post excision of the right breast. The wire tip and biopsy
marker clip are present and are marked for pathology.
IMPRESSION: Specimen radiograph of the right breast.

## 2017-07-13 DIAGNOSIS — L820 Inflamed seborrheic keratosis: Secondary | ICD-10-CM | POA: Diagnosis not present

## 2017-07-13 DIAGNOSIS — L821 Other seborrheic keratosis: Secondary | ICD-10-CM | POA: Diagnosis not present

## 2017-07-13 DIAGNOSIS — L578 Other skin changes due to chronic exposure to nonionizing radiation: Secondary | ICD-10-CM | POA: Diagnosis not present

## 2017-07-15 ENCOUNTER — Other Ambulatory Visit: Payer: Self-pay | Admitting: General Surgery

## 2017-07-15 DIAGNOSIS — C50411 Malignant neoplasm of upper-outer quadrant of right female breast: Secondary | ICD-10-CM

## 2017-07-26 ENCOUNTER — Other Ambulatory Visit: Payer: Self-pay | Admitting: Family Medicine

## 2017-07-26 NOTE — Telephone Encounter (Signed)
Received refill request electronically Last refill 01/16/17 #90/1 Last office visit 12/05/16 -acute

## 2017-07-27 NOTE — Telephone Encounter (Signed)
Medication phoned to pharmacy. Patient advised.  

## 2017-07-27 NOTE — Telephone Encounter (Signed)
Due for AMW visit when possible.  Please call in.  Thanks.

## 2017-08-04 ENCOUNTER — Other Ambulatory Visit: Payer: Self-pay | Admitting: Family Medicine

## 2017-08-04 NOTE — Telephone Encounter (Signed)
Last filled 05-11-17 #180 Last Ov Acute 12-05-16 No Future OV

## 2017-08-05 NOTE — Telephone Encounter (Signed)
Please call in.  Thanks.   

## 2017-08-07 ENCOUNTER — Other Ambulatory Visit: Payer: Self-pay | Admitting: Family Medicine

## 2017-08-07 NOTE — Telephone Encounter (Signed)
Medication phoned to pharmacy.  

## 2017-09-08 ENCOUNTER — Ambulatory Visit (INDEPENDENT_AMBULATORY_CARE_PROVIDER_SITE_OTHER): Payer: Medicare Other

## 2017-09-08 DIAGNOSIS — Z23 Encounter for immunization: Secondary | ICD-10-CM | POA: Diagnosis not present

## 2017-09-20 ENCOUNTER — Ambulatory Visit
Admission: RE | Admit: 2017-09-20 | Discharge: 2017-09-20 | Disposition: A | Discharge status: Home / Self Care | Payer: Medicare Other | Source: Ambulatory Visit | Attending: Radiation Oncology | Admitting: Radiation Oncology

## 2017-09-20 ENCOUNTER — Other Ambulatory Visit: Payer: Self-pay

## 2017-09-20 ENCOUNTER — Encounter: Payer: Self-pay | Admitting: Radiation Oncology

## 2017-09-20 VITALS — BP 139/81 | HR 78 | Resp 18 | Wt 161.7 lb

## 2017-09-20 DIAGNOSIS — R232 Flushing: Secondary | ICD-10-CM | POA: Diagnosis not present

## 2017-09-20 DIAGNOSIS — Z923 Personal history of irradiation: Secondary | ICD-10-CM | POA: Insufficient documentation

## 2017-09-20 DIAGNOSIS — Z170 Estrogen receptor positive status [ER+]: Secondary | ICD-10-CM | POA: Insufficient documentation

## 2017-09-20 DIAGNOSIS — Z79811 Long term (current) use of aromatase inhibitors: Secondary | ICD-10-CM | POA: Diagnosis not present

## 2017-09-20 DIAGNOSIS — C50411 Malignant neoplasm of upper-outer quadrant of right female breast: Secondary | ICD-10-CM

## 2017-09-20 DIAGNOSIS — D0511 Intraductal carcinoma in situ of right breast: Secondary | ICD-10-CM | POA: Diagnosis not present

## 2017-09-20 NOTE — Progress Notes (Signed)
Radiation Oncology Follow up Note  Name: Alyssa Nolan   Date:   09/20/2017 MRN:  [id number] DOB: [birth year]    This 76 y.o. female presents to the clinic today for one-month follow-up status post accelerated partial breast radiation to her right breast for ER/PR positive ductal carcinoma in situ.  REFERRING PROVIDER: Tonia Ghent, MD  HPI: Patient is a 76 year old female now out 1 year having completed accelerated partial breast irradiation to her right breast for ER/PR positive ductal carcinoma in situ. She seen today in routine follow-up and is doing well. Her only complaint is some hot flashes which seems to be improved with vitamin E therapy. She specifically denies breast tenderness cough or bone pain.. She had a mammogram back in June 2018 which was BI-RADS 2 benign. She's currently on letrozole tolerating that well without side effect  COMPLICATIONS OF TREATMENT: none  FOLLOW UP COMPLIANCE: keeps appointments   PHYSICAL EXAM:  BP 139/81   Pulse 78   Resp 18   Wt 161 lb 11.3 oz (73.4 kg)   BMI 27.76 kg/m  Lungs are clear to A&P cardiac examination essentially unremarkable with regular rate and rhythm. No dominant mass or nodularity is noted in either breast in 2 positions examined. Incision is well-healed. No axillary or supraclavicular adenopathy is appreciated. Cosmetic result is excellent. Well-developed well-nourished patient in NAD. HEENT reveals PERLA, EOMI, discs not visualized.  Oral cavity is clear. No oral mucosal lesions are identified. Neck is clear without evidence of cervical or supraclavicular adenopathy. Lungs are clear to A&P. Cardiac examination is essentially unremarkable with regular rate and rhythm without murmur rub or thrill. Abdomen is benign with no organomegaly or masses noted. Motor sensory and DTR levels are equal and symmetric in the upper and lower extremities. Cranial nerves II through XII are grossly intact. Proprioception is intact. No  peripheral adenopathy or edema is identified. No motor or sensory levels are noted. Crude visual fields are within normal range.  RADIOLOGY RESULTS: Mammograms are reviewed and compatible with the above-stated findings  PLAN: Present time she continues to do well with no evidence of disease. I'm please were overall progress. She continues on letrozole without side effect. She or he has follow-up mammograms ordered. I have asked to see her back in 1 year for follow-up. Patient is to call sooner with any concerns.  I would like to take this opportunity to thank you for allowing me to participate in the care of your patient.Armstead Peaks., MD

## 2017-10-09 DIAGNOSIS — Z1283 Encounter for screening for malignant neoplasm of skin: Secondary | ICD-10-CM | POA: Diagnosis not present

## 2017-10-09 DIAGNOSIS — L812 Freckles: Secondary | ICD-10-CM | POA: Diagnosis not present

## 2017-10-09 DIAGNOSIS — L720 Epidermal cyst: Secondary | ICD-10-CM | POA: Diagnosis not present

## 2017-10-09 DIAGNOSIS — D1801 Hemangioma of skin and subcutaneous tissue: Secondary | ICD-10-CM | POA: Diagnosis not present

## 2017-10-09 DIAGNOSIS — Z85828 Personal history of other malignant neoplasm of skin: Secondary | ICD-10-CM | POA: Diagnosis not present

## 2017-10-09 DIAGNOSIS — L578 Other skin changes due to chronic exposure to nonionizing radiation: Secondary | ICD-10-CM | POA: Diagnosis not present

## 2017-10-09 DIAGNOSIS — I8393 Asymptomatic varicose veins of bilateral lower extremities: Secondary | ICD-10-CM | POA: Diagnosis not present

## 2017-10-09 DIAGNOSIS — L821 Other seborrheic keratosis: Secondary | ICD-10-CM | POA: Diagnosis not present

## 2017-10-09 DIAGNOSIS — D229 Melanocytic nevi, unspecified: Secondary | ICD-10-CM | POA: Diagnosis not present

## 2017-10-09 DIAGNOSIS — L820 Inflamed seborrheic keratosis: Secondary | ICD-10-CM | POA: Diagnosis not present

## 2017-10-09 DIAGNOSIS — D485 Neoplasm of uncertain behavior of skin: Secondary | ICD-10-CM | POA: Diagnosis not present

## 2017-10-12 ENCOUNTER — Encounter: Payer: Self-pay | Admitting: Family Medicine

## 2017-10-13 ENCOUNTER — Encounter: Payer: Self-pay | Admitting: Internal Medicine

## 2017-10-13 ENCOUNTER — Telehealth: Payer: Self-pay | Admitting: Family Medicine

## 2017-10-13 ENCOUNTER — Ambulatory Visit (INDEPENDENT_AMBULATORY_CARE_PROVIDER_SITE_OTHER): Payer: Medicare Other | Admitting: Internal Medicine

## 2017-10-13 VITALS — BP 120/66 | HR 65 | Temp 97.8°F | Wt 162.5 lb

## 2017-10-13 DIAGNOSIS — R55 Syncope and collapse: Secondary | ICD-10-CM | POA: Insufficient documentation

## 2017-10-13 DIAGNOSIS — E785 Hyperlipidemia, unspecified: Secondary | ICD-10-CM

## 2017-10-13 LAB — COMPREHENSIVE METABOLIC PANEL
ALBUMIN: 4 g/dL (ref 3.5–5.2)
ALT: 9 U/L (ref 0–35)
AST: 18 U/L (ref 0–37)
Alkaline Phosphatase: 67 U/L (ref 39–117)
BUN: 15 mg/dL (ref 6–23)
CALCIUM: 9.9 mg/dL (ref 8.4–10.5)
CHLORIDE: 102 meq/L (ref 96–112)
CO2: 32 mEq/L (ref 19–32)
CREATININE: 0.79 mg/dL (ref 0.40–1.20)
GFR: 75.56 mL/min (ref >60.00)
Glucose, Bld: 97 mg/dL (ref 70–99)
Potassium: 3.8 mEq/L (ref 3.5–5.1)
SODIUM: 138 meq/L (ref 135–145)
Total Bilirubin: 0.7 mg/dL (ref 0.2–1.2)
Total Protein: 6.9 g/dL (ref 6.0–8.3)

## 2017-10-13 LAB — CBC
HEMATOCRIT: 46.4 % — AB (ref 36.0–46.0)
HEMOGLOBIN: 15.6 g/dL — AB (ref 12.0–15.0)
MCHC: 33.7 g/dL (ref 30.0–36.0)
MCV: 90.2 fl (ref 78.0–100.0)
Platelets: 151 10*3/uL (ref 150.0–400.0)
RBC: 5.14 Mil/uL — ABNORMAL HIGH (ref 3.87–5.11)
RDW: 13.8 % (ref 11.5–15.5)
WBC: 6.7 10*3/uL (ref 4.0–10.5)

## 2017-10-13 LAB — LIPID PANEL
CHOLESTEROL: 216 mg/dL — AB (ref 0–200)
HDL: 39.2 mg/dL (ref >39.00)
LDL CALC: 143 mg/dL — AB (ref 0–99)
NonHDL: 177.07
Total CHOL/HDL Ratio: 6
Triglycerides: 170 mg/dL — ABNORMAL HIGH (ref 0.0–149.0)
VLDL: 34 mg/dL (ref 0.0–40.0)

## 2017-10-13 LAB — T4, FREE: FREE T4: 0.9 ng/dL (ref 0.60–1.60)

## 2017-10-13 NOTE — Assessment & Plan Note (Addendum)
History most suggestive of vasovagal type attack Nothing to suggest coronary ischemia, arrhythmia, stroke or TIA Feels okay now and PE unremarkable  EKG--sinus at 61 Normal axis and intervals. Inverted T waves V1-2 are non specific and no change from 7.24.17. No other ischemic changes  Reassured and will just monitor Check her yearly labs today--just in case

## 2017-10-13 NOTE — Telephone Encounter (Signed)
See my OV note

## 2017-10-13 NOTE — Telephone Encounter (Signed)
See mychart message.  Call pt.  Likely needs OV.  Please triage this to make sure she doesn't need to go to ER.  I don't think she does, but please verify no CP, etc.  Thanks.

## 2017-10-13 NOTE — Progress Notes (Signed)
Subjective:    Patient ID: Alyssa Nolan, female    DOB: 1941-06-15, 76 y.o.   MRN: [id number]  HPI Here due to passing out spell  3AM yesterday ---her security alarm went off She "flew" out of bed Went to kitchen and put in code Speaking to alarm company and passed out--was unconscious and then started talking to woman again. Then passed out again [employer] to bed and then awoke feeling okay  Water aerobics today--felt okay Dr D recommended a visit after MyChart message  No chest pain No SOB No dizziness No edema No palpitations  Current Outpatient Medications on File Prior to Visit  Medication Sig Dispense Refill  . atorvastatin (LIPITOR) 20 MG tablet Take 1 tablet (20 mg total) by mouth daily. 90 tablet 2  . Calcium Carbonate-Vitamin D (CALCIUM 600/VITAMIN D) 600-400 MG-UNIT per tablet Take 1 tablet by mouth 2 (two) times daily.     . carisoprodol (SOMA) 350 MG tablet TAKE 1 TABLET BY MOUTH AT BEDTIME 90 tablet 1  . clobetasol ointment (TEMOVATE) 6.83 % Apply 1 application topically every other day. 30 g 2  . diclofenac sodium (VOLTAREN) 1 % GEL APPLY 2G TOPICALLY FOUR TIMES DAILY 100 g 3  . letrozole (FEMARA) 2.5 MG tablet TAKE 1 TABLET (2.5 MG TOTAL) BY MOUTH DAILY. 90 tablet 3  . loratadine (CLARITIN) 10 MG tablet Take 10 mg by mouth daily.      . meloxicam (MOBIC) 15 MG tablet Take 15 mg by mouth as needed for pain.    Marland Kitchen omeprazole (PRILOSEC) 20 MG capsule Take 1 capsule (20 mg total) by mouth daily. 90 capsule 3  . polyethylene glycol powder (GLYCOLAX/MIRALAX) powder TAKE 1 CAPFUL(17G) BY MOUTH DAILY 1530 g 3  . Propylene Glycol (SYSTANE BALANCE OP) Place 1 drop into both eyes daily.    . traMADol-acetaminophen (ULTRACET) 37.5-325 MG tablet TAKE 2 TABLETS BY MOUTH AT BEDTIME 180 tablet 1  . traZODone (DESYREL) 100 MG tablet Take 1.5 tablets (150 mg total) by mouth at bedtime. 135 tablet 3  . triamterene-hydrochlorothiazide (DYAZIDE) 37.5-25 MG capsule Take 1  each (1 capsule total) by mouth every morning. 90 capsule 3  . vitamin E 100 UNIT capsule Take by mouth daily.     No current facility-administered medications on file prior to visit.     Allergies  Allergen Reactions  . Codeine     REACTION: Felt like she was going to "pass out"    Past Medical History:  Diagnosis Date  . Arthritis 2011   R hip injection per Dr. Nelva Bush  . Breast cancer ([place]) 04/28/2016   T1c, N0; ER/PR positive, HER-2/neu negative. INVASIVE DUCTAL CARCINOMA., DCIS  . GERD (gastroesophageal reflux disease)   . History of chickenpox   . Hyperlipidemia   . Hypertension   . Lichen planus   . Osteopenia    Tscore -1, consider repeat in ~2013, per Dr. Amalia Hailey with gyn  . 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 BASAL CELL CARCINOMA EXCISION  01-22-14   chest area. Dr Nehemiah Massed  . BREAST BIOPSY Right 2013   CORE - NEG, cyst with ductal hyperplasia without atypia Dr Bary Castilla  . BREAST BIOPSY Right 04-28-16   Invasive ductal carcinoma  . BREAST CYST ASPIRATION Bilateral   . BREAST EXCISIONAL BIOPSY Right 1968   NEG  . BREAST LUMPECTOMY Right 2017  . BREAST LUMPECTOMY WITH NEEDLE LOCALIZATION Right  06/10/2016   Procedure: BREAST LUMPECTOMY WITH NEEDLE LOCALIZATION;  Surgeon: Robert Bellow, MD;  Location: [facility] ORS;  Service: General;  Laterality: Right;  . BREAST LUMPECTOMY WITH SENTINEL LYMPH NODE BIOPSY Right 06/10/2016   Procedure: BREAST LUMPECTOMY WITH SENTINEL LYMPH NODE BX / WITH RECONSTRUCTION;  Surgeon: Robert Bellow, MD;  Location: [facility] ORS;  Service: General;  Laterality: Right;  . BREAST SURGERY  1968   Fibroadenoma  . CARDIOVASCULAR STRESS TEST  02/07/2002  . [place]  . CHOLECYSTECTOMY  2005   Dr Helane Rima, [place]  . COLONOSCOPY  04-22-02   Dr Ricarda Frame, [place]  . COLONOSCOPY  09-02-12   Dr Bary Castilla  . Endometrial ablation and right ovary removal  2002  . MRI, hip  01/12/2004  . MRI, neck   06/23/1998  10/31/2003  . OOPHORECTOMY Bilateral 15+ YRS AGO  . Removal of excess eyelids and shortening of eyelid tendons  01/2010  . 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 Right hip injection  03/30/2009  . Touch Up of excess eyelids  04/20/2010    Family History  Problem Relation Age of Onset  . COPD Mother        Smoker  . Heart disease Father        Possible CHF  . Stroke Maternal Grandfather 83       CVA  . Colon cancer Unknown        mother's 1/2 sister  . Breast cancer Neg Hx   . Diabetes Neg Hx     Social History   Socioeconomic History  . Marital status: Married    Spouse name: Not on file  . Number of children: 2  . Years of education: Not on file  . Highest education level: Not on file  Social Needs  . Financial resource strain: Not on file  . Food insecurity - worry: Not on file  . Food insecurity - inability: Not on file  . Transportation needs - medical: Not on file  . Transportation needs - non-medical: Not on file  Occupational History  . Occupation: Retired since 2007 and moved to [employer]    Employer: RETIRED  Tobacco Use  . Smoking status: Former Smoker    Types: Cigarettes    Last attempt to quit: 11/14/1969    Years since quitting: 47.9  . Smokeless tobacco: Never Used  Substance and Sexual Activity  . Alcohol use: Yes    Comment: Rare  . Drug use: No  . Sexual activity: Not Currently    Birth control/protection: Post-menopausal  Other Topics Concern  . Not on file  Social History Narrative   From [place]:  [employer] grad   2 kids in [place] cards and likes to knit   Review of Systems  Appetite is fine Generally sleeps okay No new medications or supplements     Objective:   Physical Exam  Constitutional: She appears well-developed. No distress.  Neck: No thyromegaly present.  Cardiovascular: Normal rate, regular rhythm, normal heart sounds and intact distal pulses. Exam reveals no  gallop.  No murmur heard. Pulmonary/Chest: Effort normal and breath sounds normal. No respiratory distress. She has no wheezes.  Musculoskeletal: She exhibits no edema.  Lymphadenopathy:    She has no cervical adenopathy.  Psychiatric: She has a normal mood and affect. Her behavior is normal.          Assessment & Plan:

## 2017-10-13 NOTE — Telephone Encounter (Signed)
I spoke with pt and she feels fine this morning; pt said yesterday morning she was awakened from a deep sleep with her house alarm going off; pt jumped up and then the alarm rep called her and while talking passed out for brief time x 2. No CP,SOB,h/a or dizziness. Dr Damita Dunnings advised likely needed OV; Dr Damita Dunnings schedule full and pt scheduled with Dr Silvio Pate today at 12:15. If pt experiences any CP,SOB,dizziness,h/a or passing out will go to Ed prior to appt.

## 2017-10-16 DIAGNOSIS — M5136 Other intervertebral disc degeneration, lumbar region: Secondary | ICD-10-CM | POA: Diagnosis not present

## 2017-10-25 ENCOUNTER — Other Ambulatory Visit: Payer: Self-pay | Admitting: Family Medicine

## 2017-10-27 ENCOUNTER — Ambulatory Visit (INDEPENDENT_AMBULATORY_CARE_PROVIDER_SITE_OTHER): Payer: Medicare Other | Admitting: Internal Medicine

## 2017-10-27 ENCOUNTER — Encounter: Payer: Self-pay | Admitting: Internal Medicine

## 2017-10-27 VITALS — BP 120/70 | HR 84 | Ht 64.0 in | Wt 161.0 lb

## 2017-10-27 DIAGNOSIS — K219 Gastro-esophageal reflux disease without esophagitis: Secondary | ICD-10-CM

## 2017-10-27 DIAGNOSIS — K5909 Other constipation: Secondary | ICD-10-CM

## 2017-10-27 NOTE — Patient Instructions (Signed)
Please follow up with Dr Hilarie Fredrickson in 1 year.  Continue current medications.  Purchase Miralax over the counter and use as directed.  If you are age 74 or older, your body mass index should be between 23-30. Your Body mass index is 27.64 kg/m. If this is out of the aforementioned range listed, please consider follow up with your Primary Care Provider.  If you are age 61 or younger, your body mass index should be between 19-25. Your Body mass index is 27.64 kg/m. If this is out of the aformentioned range listed, please consider follow up with your Primary Care Provider.

## 2017-10-27 NOTE — Progress Notes (Signed)
Subjective:    Patient ID: Alyssa Nolan, female    DOB: May 24, 1941, 76 y.o.   MRN: [id number]  HPI Alyssa Nolan is a 76 year old female with a history of GERD and chronic constipation is here for follow-up.  She was last seen in April 2018.  She reports she has been doing great from a GI perspective.  Her constipation responds well to MiraLAX which she uses 17 g daily.  She is disappointed to learn that the FDA will not allow this medication to be covered by her prescription drug coverage.  She will buy this over-the-counter.  No abdominal pain, blood in her stool or melena.  Reflux symptoms are well controlled on omeprazole 20 mg daily.  She is tried to stop this medication but reflux symptoms return.  No heartburn on the medication and no dysphagia or odynophagia.  No early satiety.  Good energy levels.  No weight loss.  Good appetite.  Review of Systems As per HPI, otherwise negative  Current Medications, Allergies, Past Medical History, Past Surgical History, Family History and Social History were reviewed in [employer] record.     Objective:   Physical Exam BP 120/70   Pulse 84   Ht 5\' 4"  (1.626 m)   Wt 161 lb (73 kg)   BMI 27.64 kg/m  Constitutional: Well-developed and well-nourished. No distress. HEENT: Normocephalic and atraumatic.  Conjunctivae are normal.  No scleral icterus. Neck: Neck supple. Trachea midline. Cardiovascular: Normal rate, regular rhythm and intact distal pulses. No M/R/G Pulmonary/chest: Effort normal and breath sounds normal. No wheezing, rales or rhonchi. Abdominal: Soft, nontender, nondistended. Bowel sounds active throughout. There are no masses palpable. No hepatosplenomegaly. Extremities: no clubbing, cyanosis, or edema Neurological: Alert and oriented to person place and time. Skin: Skin is warm and dry. Psychiatric: Normal mood and affect. Behavior is normal.  CBC    Component Value Date/Time   WBC 6.7  10/13/2017 1340   RBC 5.14 (H) 10/13/2017 1340   HGB 15.6 (H) 10/13/2017 1340   HCT 46.4 (H) 10/13/2017 1340   PLT 151.0 10/13/2017 1340   MCV 90.2 10/13/2017 1340   MCHC 33.7 10/13/2017 1340   RDW 13.8 10/13/2017 1340   LYMPHSABS 2.2 09/04/2014 0831   MONOABS 0.7 09/04/2014 0831   EOSABS 0.3 09/04/2014 0831   BASOSABS 0.0 09/04/2014 0831   CMP     Component Value Date/Time   NA 138 10/13/2017 1340   K 3.8 10/13/2017 1340   CL 102 10/13/2017 1340   CO2 32 10/13/2017 1340   GLUCOSE 97 10/13/2017 1340   BUN 15 10/13/2017 1340   CREATININE 0.79 10/13/2017 1340   CALCIUM 9.9 10/13/2017 1340   PROT 6.9 10/13/2017 1340   ALBUMIN 4.0 10/13/2017 1340   AST 18 10/13/2017 1340   ALT 9 10/13/2017 1340   ALKPHOS 67 10/13/2017 1340   BILITOT 0.7 10/13/2017 1340   GFRNONAA 90.18 08/06/2010 0912      Assessment & Plan:  76 year old female with a history of GERD and chronic constipation is here for follow-up.   1.  Chronic constipation --well-controlled with MiraLAX 17 g daily.  Continue current therapy  2.  GERD --well-controlled on low-dose omeprazole.  Continue omeprazole 20 mg daily.  No alarm symptoms.  We discussed the risks, benefits of long-term PPI therapy and she wishes to continue this medicine.  3.  CRC screening --normal colonoscopy 2013, 10-year recall would be 2023 at which time she would be near age 29.  We will discuss further at that time  12-16-month follow-up, sooner if needed 15 minutes spent with the patient today. Greater than 50% was spent in counseling and coordination of care with the patient

## 2017-10-30 ENCOUNTER — Other Ambulatory Visit: Payer: Self-pay | Admitting: *Deleted

## 2017-10-30 NOTE — Telephone Encounter (Signed)
Faxed refill request. Trazodone Last office visit:   10/13/2017 Acute with [place] Last Filled:    135 tablet 3 10/31/2016  Please advise.

## 2017-10-31 ENCOUNTER — Ambulatory Visit: Payer: Medicare Other

## 2017-10-31 MED ORDER — TRAZODONE HCL 100 MG PO TABS: 150.0000 mg | ORAL_TABLET | Freq: Every day | ORAL | 1 refills | Status: DC

## 2017-10-31 NOTE — Telephone Encounter (Signed)
Sent. Thanks.   

## 2017-11-02 ENCOUNTER — Other Ambulatory Visit: Payer: Self-pay | Admitting: Family Medicine

## 2017-11-08 ENCOUNTER — Ambulatory Visit (INDEPENDENT_AMBULATORY_CARE_PROVIDER_SITE_OTHER): Payer: Medicare Other | Admitting: Family Medicine

## 2017-11-08 ENCOUNTER — Encounter: Payer: Self-pay | Admitting: Family Medicine

## 2017-11-08 ENCOUNTER — Ambulatory Visit: Payer: Medicare Other | Admitting: Family Medicine

## 2017-11-08 VITALS — BP 132/74 | HR 81 | Ht 64.0 in | Wt 163.5 lb

## 2017-11-08 DIAGNOSIS — D0511 Intraductal carcinoma in situ of right breast: Secondary | ICD-10-CM

## 2017-11-08 DIAGNOSIS — G4700 Insomnia, unspecified: Secondary | ICD-10-CM | POA: Diagnosis not present

## 2017-11-08 DIAGNOSIS — I10 Essential (primary) hypertension: Secondary | ICD-10-CM

## 2017-11-08 DIAGNOSIS — Z7189 Other specified counseling: Secondary | ICD-10-CM | POA: Diagnosis not present

## 2017-11-08 DIAGNOSIS — E785 Hyperlipidemia, unspecified: Secondary | ICD-10-CM

## 2017-11-08 DIAGNOSIS — Z0000 Encounter for general adult medical examination without abnormal findings: Secondary | ICD-10-CM

## 2017-11-08 NOTE — Patient Instructions (Addendum)
If you have any other troubles then we need to lower your BP med dose.   Take care.  Glad to see you.  Don't change your meds for now.  Update [place] as needed.

## 2017-11-08 NOTE — Progress Notes (Signed)
I have personally reviewed the Medicare Annual Wellness questionnaire and have noted 1. The patient's medical and social history 2. Their use of alcohol, tobacco or illicit drugs 3. Their current medications and supplements 4. The patient's functional ability including ADL's, fall risks, home safety risks and hearing or visual             impairment. 5. Diet and physical activities 6. Evidence for depression or mood disorders  The patients weight, height, BMI have been recorded in the chart and visual acuity is per eye clinic.  I have made referrals, counseling and provided education to the patient based review of the above and I have provided the pt with a written personalized care plan for preventive services.  Provider list updated- see scanned forms.  Routine anticipatory guidance given to patient.  See health maintenance. The possibility exists that previously documented standard health maintenance information may have been brought forward from a previous encounter into this note.  If needed, that same information has been updated to reflect the current situation based on today's encounter.    Flu 2018 Shingles 2011 PNA 2015 Tetanus 2011 Colonoscopy 2013 Breast cancer screening- mammogram 2018 DXA 2017 Advance directive- husband designated if patient were incapacitated.  Cognitive function addressed- see scanned forms- and if abnormal then additional documentation follows.  Elevated Cholesterol: Using medications without problems: yes Muscle aches: likely not from statin Diet compliance: yes Exercise:yes Labs d/w pt.  She wasn't fasting on recent lipid panel so this is likely reasonable.    Hypertension:    Using medication without problems or lightheadedness: yes Chest pain with exertion:no Edema:no Short of breath:no  Insomnia.  She has some relief from med, w/o ADE.  She wouldn't likely sleep well w/o med, failed trial off med prev.  D/w pt.    H/o breast cancer.  She  has had routine follow up.  I'll defer.  Mammogram up to date.  She agrees.    Recent syncopal event, likely with external factors contributing, getting up suddenly from the bed.  She isn't lightheaded o/w.  D/w pt about options.  No other sx in the meantime or previously.    She has B 1st finger triggering, L>R, she is putting up with sx.    D/w pt about home stressors.    PMH and SH reviewed  Meds, vitals, and allergies reviewed.   ROS: Per HPI.  Unless specifically indicated otherwise in HPI, the patient denies:  General: fever. Eyes: acute vision changes ENT: sore throat Cardiovascular: chest pain Respiratory: SOB GI: vomiting GU: dysuria Musculoskeletal: acute back pain Derm: acute rash Neuro: acute motor dysfunction Psych: worsening mood Endocrine: polydipsia Heme: bleeding Allergy: hayfever  GEN: nad, alert and oriented HEENT: mucous membranes moist NECK: supple w/o LA CV: rrr. PULM: ctab, no inc wob ABD: soft, +bs EXT: no edema SKIN: no acute rash B, L>R, 1st finger triggering noted.

## 2017-11-09 NOTE — Assessment & Plan Note (Signed)
Reasonable to continue baseline meds as is.  She agrees.  No ADE on med.

## 2017-11-09 NOTE — Assessment & Plan Note (Signed)
Per surgery/etc.  I'll defer.  She agrees.

## 2017-11-09 NOTE — Assessment & Plan Note (Signed)
Flu 2018 Shingles 2011 PNA 2015 Tetanus 2011 Colonoscopy 2013 Breast cancer screening- mammogram 2018 DXA 2017 Advance directive- husband designated if patient were incapacitated.  Cognitive function addressed- see scanned forms- and if abnormal then additional documentation follows.

## 2017-11-09 NOTE — Assessment & Plan Note (Signed)
No ADE on med except for the possible recent event.  She isn't lightheaded now.  If any other lightheadedness or other concerns, then we can taper her BP meds.  She agrees.  O/w continue as is.  She agrees.  No need for f/u labs today.

## 2017-11-09 NOTE — Assessment & Plan Note (Signed)
Advance directive- husband designated if patient were incapacitated.  

## 2017-11-09 NOTE — Assessment & Plan Note (Signed)
Continue statin, recent labs reasonable- she wasn't fasting.  No ADE on med.  Continue as is.  She agrees.

## 2018-01-01 ENCOUNTER — Encounter: Payer: Self-pay | Admitting: Family Medicine

## 2018-01-09 ENCOUNTER — Encounter: Payer: Self-pay | Admitting: Family Medicine

## 2018-01-10 ENCOUNTER — Ambulatory Visit (INDEPENDENT_AMBULATORY_CARE_PROVIDER_SITE_OTHER): Payer: Medicare Other | Admitting: Internal Medicine

## 2018-01-10 ENCOUNTER — Encounter: Payer: Self-pay | Admitting: Internal Medicine

## 2018-01-10 VITALS — BP 114/70 | HR 70 | Temp 98.1°F | Wt 166.0 lb

## 2018-01-10 DIAGNOSIS — H1032 Unspecified acute conjunctivitis, left eye: Secondary | ICD-10-CM

## 2018-01-10 MED ORDER — SULFACETAMIDE SODIUM 10 % OP OINT: TOPICAL_OINTMENT | Freq: Three times a day (TID) | OPHTHALMIC | 1 refills | Status: DC

## 2018-01-10 NOTE — Assessment & Plan Note (Signed)
Early and non specific The outer redness suggests it may be irritative Avoid makeup for now Will try antibiotic ointment for now ophthalmologist if worsens

## 2018-01-10 NOTE — Patient Instructions (Signed)
Try the ointment for your eye and avoid the makeup. You should see your eye doctor if it doesn't get better.

## 2018-01-10 NOTE — Progress Notes (Signed)
Subjective:    Patient ID: Alyssa Nolan, female    DOB: 13-Apr-1941, 77 y.o.   MRN: [id number]  HPI Here due to possible eye infection  Had some scaling on the side of her left eye Made appt with dermatologist--but then it went away  Awoke with redness around the outside of left eye Puffy No discharge Itching but no pain  No new makeup---does use eyeliner and mascara  No Rx as yet  Current Outpatient Medications on File Prior to Visit  Medication Sig Dispense Refill  . atorvastatin (LIPITOR) 20 MG tablet TAKE 1 TABLET (20 MG TOTAL) BY MOUTH DAILY. 90 tablet 0  . Calcium Carbonate-Vitamin D (CALCIUM 600/VITAMIN D) 600-400 MG-UNIT per tablet Take 1 tablet by mouth 2 (two) times daily.     . carisoprodol (SOMA) 350 MG tablet TAKE 1 TABLET BY MOUTH AT BEDTIME 90 tablet 1  . clobetasol ointment (TEMOVATE) 6.71 % Apply 1 application topically every other day. (Patient taking differently: Apply 1 application topically every other day. AS NEEDED) 30 g 2  . diclofenac sodium (VOLTAREN) 1 % GEL APPLY 2G TOPICALLY FOUR TIMES DAILY (Patient taking differently: APPLY 2G TOPICALLY FOUR TIMES DAILY AS NEEDED) 100 g 3  . letrozole (FEMARA) 2.5 MG tablet TAKE 1 TABLET (2.5 MG TOTAL) BY MOUTH DAILY. 90 tablet 3  . loratadine (CLARITIN) 10 MG tablet Take 10 mg by mouth daily.      . meloxicam (MOBIC) 15 MG tablet Take 15 mg by mouth as needed for pain.    Marland Kitchen omeprazole (PRILOSEC) 20 MG capsule Take 1 capsule (20 mg total) by mouth daily. 90 capsule 3  . polyethylene glycol powder (GLYCOLAX/MIRALAX) powder TAKE 1 CAPFUL(17G) BY MOUTH DAILY 1530 g 3  . Propylene Glycol (SYSTANE BALANCE OP) Place 1 drop into both eyes daily.    . traMADol-acetaminophen (ULTRACET) 37.5-325 MG tablet TAKE 2 TABLETS BY MOUTH AT BEDTIME 180 tablet 1  . traZODone (DESYREL) 100 MG tablet Take 1.5 tablets (150 mg total) by mouth at bedtime. 135 tablet 1  . triamterene-hydrochlorothiazide (DYAZIDE) 37.5-25 MG capsule  TAKE ONE CAPSULE BY MOUTH IN THE MORNING 90 capsule 0  . vitamin E 100 UNIT capsule Take by mouth daily.     No current facility-administered medications on file prior to visit.     Allergies  Allergen Reactions  . Codeine     REACTION: Felt like she was going to "pass out"    Past Medical History:  Diagnosis Date  . Arthritis 2011   R hip injection per Dr. Nelva Bush  . Breast cancer ([place]) 04/28/2016   T1c, N0; ER/PR positive, HER-2/neu negative. INVASIVE DUCTAL CARCINOMA., DCIS  . GERD (gastroesophageal reflux disease)   . History of chickenpox   . Hyperlipidemia   . Hypertension   . Lichen planus   . Osteopenia    Tscore -1, consider repeat in ~2013, per Dr. Amalia Hailey with gyn  . 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 BASAL CELL CARCINOMA EXCISION  01-22-14   chest area. Dr Nehemiah Massed  . BREAST BIOPSY Right 2013   CORE - NEG, cyst with ductal hyperplasia without atypia Dr Bary Castilla  . BREAST BIOPSY Right 04-28-16   Invasive ductal carcinoma  . BREAST CYST ASPIRATION Bilateral   . BREAST EXCISIONAL BIOPSY Right 1968   NEG  . BREAST LUMPECTOMY Right 2017  . BREAST LUMPECTOMY WITH NEEDLE LOCALIZATION Right 06/10/2016  Procedure: BREAST LUMPECTOMY WITH NEEDLE LOCALIZATION;  Surgeon: Robert Bellow, MD;  Location: [facility] ORS;  Service: General;  Laterality: Right;  . BREAST LUMPECTOMY WITH SENTINEL LYMPH NODE BIOPSY Right 06/10/2016   Procedure: BREAST LUMPECTOMY WITH SENTINEL LYMPH NODE BX / WITH RECONSTRUCTION;  Surgeon: Robert Bellow, MD;  Location: [facility] ORS;  Service: General;  Laterality: Right;  . BREAST SURGERY  1968   Fibroadenoma  . CARDIOVASCULAR STRESS TEST  02/07/2002  . [place]  . CHOLECYSTECTOMY  2005   Dr Helane Rima, [place]  . COLONOSCOPY  04-22-02   Dr Ricarda Frame, [place]  . COLONOSCOPY  09-02-12   Dr Bary Castilla  . Endometrial ablation and right ovary removal  2002  . MRI, hip  01/12/2004  . MRI, neck   06/23/1998  10/31/2003  . OOPHORECTOMY Bilateral 15+ YRS AGO  . Removal of excess eyelids and shortening of eyelid tendons  01/2010  . 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 Right hip injection  03/30/2009  . Touch Up of excess eyelids  04/20/2010    Family History  Problem Relation Age of Onset  . COPD Mother        Smoker  . Heart disease Father        Possible CHF  . Stroke Maternal Grandfather 83       CVA  . Colon cancer Unknown        mother's 1/2 sister  . Breast cancer Neg Hx   . Diabetes Neg Hx     Social History   Socioeconomic History  . Marital status: Married    Spouse name: Not on file  . Number of children: 2  . Years of education: Not on file  . Highest education level: Not on file  Social Needs  . Financial resource strain: Not on file  . Food insecurity - worry: Not on file  . Food insecurity - inability: Not on file  . Transportation needs - medical: Not on file  . Transportation needs - non-medical: Not on file  Occupational History  . Occupation: Retired since 2007 and moved to [employer]    Employer: RETIRED  Tobacco Use  . Smoking status: Former Smoker    Types: Cigarettes    Last attempt to quit: 11/14/1969    Years since quitting: 48.1  . Smokeless tobacco: Never Used  Substance and Sexual Activity  . Alcohol use: Yes    Comment: Rare  . Drug use: No  . Sexual activity: Not Currently    Birth control/protection: Post-menopausal  Other Topics Concern  . Not on file  Social History Narrative   From [place]:  [employer] grad   2 kids in [place] cards and likes to knit   Review of Systems  No fever No URI symptoms No vision loss or change     Objective:   Physical Exam  Eyes:  Mild redness lateral and inferior to left eye Slight conjunctival injection but not striking No apparent foreign body          Assessment & Plan:

## 2018-01-19 ENCOUNTER — Other Ambulatory Visit: Payer: Self-pay | Admitting: *Deleted

## 2018-01-20 ENCOUNTER — Other Ambulatory Visit: Payer: Self-pay | Admitting: Family Medicine

## 2018-01-29 ENCOUNTER — Other Ambulatory Visit: Payer: Self-pay | Admitting: Family Medicine

## 2018-01-29 NOTE — Telephone Encounter (Signed)
Electronic refill request.  Last office visit:   11/08/17 CPE Last Filled:    90 tablet 1 07/27/2017  Last Filled:   Tramadol 180 tablet 1 08/05/2017  Please advise.

## 2018-01-30 NOTE — Telephone Encounter (Signed)
Sent. Thanks.   

## 2018-02-14 ENCOUNTER — Encounter: Payer: Self-pay | Admitting: Family Medicine

## 2018-03-21 ENCOUNTER — Encounter: Payer: Medicare Other | Admitting: Obstetrics and Gynecology

## 2018-04-02 ENCOUNTER — Other Ambulatory Visit: Payer: Self-pay

## 2018-04-02 DIAGNOSIS — Z170 Estrogen receptor positive status [ER+]: Principal | ICD-10-CM

## 2018-04-02 DIAGNOSIS — C50411 Malignant neoplasm of upper-outer quadrant of right female breast: Secondary | ICD-10-CM

## 2018-04-02 NOTE — Progress Notes (Signed)
Patient ID: Alyssa Nolan, female   DOB: 07-Sep-1941, 77 y.o.   MRN: [id number] ANNUAL PREVENTATIVE CARE GYN  ENCOUNTER NOTE  Subjective:       Alyssa Nolan is a 77 y.o. No obstetric history on file. female here for a routine annual gynecologic exam.  Current complaints:  1. Annual Medicare physical 2. follow up on lichen planus; minimal symptoms; uses Temovate ointment approximately every 3 weeks for several days to control irritation 3. Osteopenia; taking calcium vitamin D 4. Vaginal atrophy asymptomatic       Patient describes occasional vulvar puritius and irritation associated with lichen planus, well controlled with Temovate.patient uses as needed. She reports taking vitamin D, calcium supplements and engages in regular exercise.     Gynecologic History No LMP recorded. Patient is postmenopausal. Contraception: post menopausal status Last Pap:  04/02/2017 neg History of breast cancer-DCIS; Last mammogram: 04/2017 birad 2  Obstetric History OB History  Gravida Para Term Preterm AB Living  2 2          SAB TAB Ectopic Multiple Live Births               # Outcome Date GA Lbr Len/2nd Weight Sex Delivery Anes PTL Lv  2 Para           1 Para             Obstetric Comments  1st Menstrual Cycle:  14  1st Pregnancy:  30    Past Medical History:  Diagnosis Date  . Arthritis 2011   R hip injection per Dr. Nelva Bush  . Breast cancer ([place]) 04/28/2016   T1c, N0; ER/PR positive, HER-2/neu negative. INVASIVE DUCTAL CARCINOMA., DCIS  . GERD (gastroesophageal reflux disease)   . History of chickenpox   . Hyperlipidemia   . Hypertension   . Lichen planus   . Osteopenia    Tscore -1, consider repeat in ~2013, per Dr. Amalia Hailey with gyn  . 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 BASAL CELL CARCINOMA EXCISION  01-22-14   chest area. Dr Nehemiah Massed  . BREAST BIOPSY Right 2013   CORE - NEG, cyst with ductal hyperplasia without  atypia Dr Bary Castilla  . BREAST BIOPSY Right 04-28-16   Invasive ductal carcinoma  . BREAST CYST ASPIRATION Bilateral   . BREAST EXCISIONAL BIOPSY Right 1968   NEG  . BREAST LUMPECTOMY Right 2017  . BREAST LUMPECTOMY WITH NEEDLE LOCALIZATION Right 06/10/2016   Procedure: BREAST LUMPECTOMY WITH NEEDLE LOCALIZATION;  Surgeon: Robert Bellow, MD;  Location: [facility] OR[facility];  Service: General;  Laterality: Right;  . BREAST LUMPECTOMY WITH SENTINEL LYMPH NODE BIOPSY Right 06/10/2016   Procedure: BREAST LUMPECTOMY WITH SENTINEL LYMPH NODE BX / WITH RECONSTRUCTION;  Surgeon: Robert Bellow, MD;  Location: [facility] OR[facility];  Service: General;  Laterality: Right;  . BREAST SURGERY  1968   Fibroadenoma  . CARDIOVASCULAR STRESS TEST  02/07/2002  . [place]  . CHOLECYSTECTOMY  2005   Dr Helane Rima, [place]  . COLONOSCOPY  04-22-02   Dr Ricarda Frame, [place]  . COLONOSCOPY  09-02-12   Dr Bary Castilla  . Endometrial ablation and right ovary removal  2002  . MRI, hip  01/12/2004  . MRI, neck  06/23/1998  10/31/2003  . OOPHORECTOMY Bilateral 15+ YRS AGO  . Removal of excess eyelids and shortening of eyelid tendons  01/2010  . Removal of squamous and basal  cell carcinoma from nose  04/2010  . Right hip injection  03/30/2009  . Touch Up of excess eyelids  04/20/2010    Current Outpatient Medications on File Prior to Visit  Medication Sig Dispense Refill  . atorvastatin (LIPITOR) 20 MG tablet TAKE 1 TABLET (20 MG TOTAL) BY MOUTH DAILY. 90 tablet 2  . Calcium Carbonate-Vitamin D (CALCIUM 600/VITAMIN D) 600-400 MG-UNIT per tablet Take 1 tablet by mouth 2 (two) times daily.     . carisoprodol (SOMA) 350 MG tablet TAKE 1 TABLET BY MOUTH AT BEDTIME 90 tablet 1  . clobetasol ointment (TEMOVATE) 6.20 % Apply 1 application topically every other day. (Patient taking differently: Apply 1 application topically every other day. AS NEEDED) 30 g 2  . diclofenac sodium (VOLTAREN) 1 % GEL APPLY 2G TOPICALLY FOUR TIMES DAILY (Patient taking  differently: APPLY 2G TOPICALLY FOUR TIMES DAILY AS NEEDED) 100 g 3  . letrozole (FEMARA) 2.5 MG tablet TAKE 1 TABLET (2.5 MG TOTAL) BY MOUTH DAILY. 90 tablet 3  . loratadine (CLARITIN) 10 MG tablet Take 10 mg by mouth daily.      . meloxicam (MOBIC) 15 MG tablet Take 15 mg by mouth as needed for pain.    Marland Kitchen omeprazole (PRILOSEC) 20 MG capsule Take 1 capsule (20 mg total) by mouth daily. 90 capsule 3  . polyethylene glycol powder (GLYCOLAX/MIRALAX) powder TAKE 1 CAPFUL(17G) BY MOUTH DAILY 1530 g 3  . Propylene Glycol (SYSTANE BALANCE OP) Place 1 drop into both eyes daily.    Marland Kitchen sulfacetamide (BLEPH-10) 10 % ophthalmic ointment Place into both eyes 3 (three) times daily. 3.5 g 1  . traMADol-acetaminophen (ULTRACET) 37.5-325 MG tablet TAKE 2 TABLETS BY MOUTH AT BEDTIME 180 tablet 1  . traZODone (DESYREL) 100 MG tablet Take 1.5 tablets (150 mg total) by mouth at bedtime. 135 tablet 1  . triamterene-hydrochlorothiazide (DYAZIDE) 37.5-25 MG capsule TAKE ONE CAPSULE BY MOUTH IN THE MORNING 90 capsule 2  . vitamin E 100 UNIT capsule Take by mouth daily.     No current facility-administered medications on file prior to visit.     Allergies  Allergen Reactions  . Codeine     REACTION: Felt like she was going to "pass out"    Social History   Socioeconomic History  . Marital status: Married    Spouse name: Not on file  . Number of children: 2  . Years of education: Not on file  . Highest education level: Not on file  Occupational History  . Occupation: Retired since 2007 and moved to [place]: [employer]  . Financial resource strain: Not on file  . Food insecurity:    Worry: Not on file    Inability: Not on file  . Transportation needs:    Medical: Not on file    Non-medical: Not on file  Tobacco Use  . Smoking status: Former Smoker    Types: Cigarettes    Last attempt to quit: 11/14/1969    Years since quitting: 48.4  . Smokeless tobacco: Never Used  Substance and  Sexual Activity  . Alcohol use: Yes    Comment: Rare  . Drug use: No  . Sexual activity: Not Currently    Birth control/protection: Post-menopausal  Lifestyle  . Physical activity:    Days per week: Not on file    Minutes per session: Not on file  . Stress: Not on file  Relationships  . Social connections:    Talks  on phone: Not on file    Gets together: Not on file    Attends religious service: Not on file    Active member of club or organization: Not on file    Attends meetings of clubs or organizations: Not on file    Relationship status: Not on file  . Intimate partner violence:    Fear of current or ex partner: Not on file    Emotionally abused: Not on file    Physically abused: Not on file    Forced sexual activity: Not on file  Other Topics Concern  . Not on file  Social History Narrative   From [place]:  [employer] grad   2 kids in [place] cards and likes to knit    Family History  Problem Relation Age of Onset  . COPD Mother        Smoker  . Heart disease Father        Possible CHF  . Stroke Maternal Grandfather 83       CVA  . Colon cancer Unknown        mother's 1/2 sister  . Breast cancer Neg Hx   . Diabetes Neg Hx     The following portions of the patient's history were reviewed and updated as appropriate: allergies, current medications, past family history, past medical history, past social history, past surgical history and problem list.  Review of Systems Review of Systems  Constitutional: Negative.   HENT: Negative.   Eyes: Negative.   Respiratory: Negative.   Cardiovascular: Negative.   Gastrointestinal: Positive for constipation.  Genitourinary: Negative.   Musculoskeletal: Negative.   Skin: Negative.   Neurological: Negative.   Endo/Heme/Allergies: Negative.   Psychiatric/Behavioral: Negative.     Objective:   BP (!) 147/67   Pulse 80   Ht 5' 4"  (1.626 m)   Wt 164 lb 14.4 oz (74.8 kg)   BMI 28.31  kg/m  CONSTITUTIONAL: Well-developed, well-nourished female in no acute distress.  PSYCHIATRIC: Normal mood and affect. Normal behavior. Normal judgment and thought content. [place]: Alert. Normal muscle tone coordination. HENT:  Normocephalic, atraumatic EYES: Conjunctivae and EOM are normal. . No scleral icterus.  NECK: Normal range of motion, supple, no masses.  Normal thyroid.  SKIN: Skin is warm, dry and appropriately thinned for age.  No rash noted. Not diaphoretic. No erythema. No pallor. CARDIOVASCULAR: Normal heart rate noted, regular rhythm, no murmur, rubs or gallops. RESPIRATORY: Clear to auscultation bilaterally. Effort and breath sounds normal, no problems with respiration noted. BREASTS: Symmetric in size. No masses, skin changes, nipple drainage, or lymphadenopathy. Post right breast lumpectomy 05/2016. Incision clean, dry, non-tender, with No significant induration.  ABDOMEN: Soft, no distention noted.  No tenderness, rebound or guarding.  BLADDER: Normal PELVIC:  External Genitalia: mild atrophic changes.  Minimal hypopigmentation of labia majora consistent with lichen planus. No erosions or epithelial skin breakdown.   BUS: Normal  Vagina: moderate - severe atrophy; single digit exam performed  cervix: normal, non-tender; no cervical motion  Uterus: normal, no masses, appropriate size and shape  Adnexa: Normal; nonpalpable nontender  RV: External Exam NormaI, No Rectal Masses and Normal Sphincter tone  MUSCULOSKELETAL:  No cyanosis, or edema.  2+ distal pulses. LYMPHATIC: No Axillary or Supraclavicular Adenopathy.    Assessment:   Annual gynecologic examination 77 y.o. Contraception: post menopausal status  BMI 28 Lichen planus, stable on Temovate ointment therapy every couple weeks Osteopenia History of breast cancer,  status post lumpectomy and radiation therapy Vaginal atrophy, not candidate for vaginal estrogen cream at this time    Plan:  Pap:not  needed Mammogram: Ordered Dr. Tollie Pizza- [facility] Testing:  Ordered Labs: THRU PCP Routine preventative health maintenance measures emphasized: Daily use of calcium and vitamin D supplements,, Exercise/Diet/Weight control, Tobacco Warnings, Alcohol/Substance use risks and Stress Management Continue with Temovate ointment 0.05% 1-3 times weekly for lichen planus as needed Return to [place], CMA  Brayton Mars, MD  Note: This dictation was prepared with Dragon dictation along with smaller phrase technology. Any transcriptional errors that result from this process are unintentional.

## 2018-04-04 ENCOUNTER — Ambulatory Visit (INDEPENDENT_AMBULATORY_CARE_PROVIDER_SITE_OTHER): Payer: Medicare Other | Admitting: Obstetrics and Gynecology

## 2018-04-04 ENCOUNTER — Encounter: Payer: Self-pay | Admitting: Obstetrics and Gynecology

## 2018-04-04 VITALS — BP 147/67 | HR 80 | Ht 64.0 in | Wt 164.9 lb

## 2018-04-04 DIAGNOSIS — L439 Lichen planus, unspecified: Secondary | ICD-10-CM | POA: Diagnosis not present

## 2018-04-04 DIAGNOSIS — N952 Postmenopausal atrophic vaginitis: Secondary | ICD-10-CM

## 2018-04-04 DIAGNOSIS — Z01419 Encounter for gynecological examination (general) (routine) without abnormal findings: Secondary | ICD-10-CM

## 2018-04-04 DIAGNOSIS — D0511 Intraductal carcinoma in situ of right breast: Secondary | ICD-10-CM

## 2018-04-04 DIAGNOSIS — Z1211 Encounter for screening for malignant neoplasm of colon: Secondary | ICD-10-CM

## 2018-04-04 DIAGNOSIS — M8580 Other specified disorders of bone density and structure, unspecified site: Secondary | ICD-10-CM

## 2018-04-04 MED ORDER — CLOBETASOL PROPIONATE 0.05 % EX OINT: 1.0000 "application " | TOPICAL_OINTMENT | CUTANEOUS | 2 refills | Status: DC

## 2018-04-04 NOTE — Patient Instructions (Addendum)
1.  Pap smear is not done. 2.  Screening mammogram is ordered 3.  Stool guaiac testing for colon cancer screening is ordered 4.  Screening labs are to be obtained through primary care 5.  Continue with healthy eating and exercise 6.  Recommend calcium 600 mg twice a day and vitamin D 400 international units twice a day 7.  Return in 1 year for annual exam-Medicare physical   Health Maintenance for Postmenopausal Women Menopause is a normal process in which your reproductive ability comes to an end. This process happens gradually over a span of months to years, usually between the ages of 32 and 8. Menopause is complete when you have missed 12 consecutive menstrual periods. It is important to talk with your health care provider about some of the most common conditions that affect postmenopausal women, such as heart disease, cancer, and bone loss (osteoporosis). Adopting a healthy lifestyle and getting preventive care can help to promote your health and wellness. Those actions can also lower your chances of developing some of these common conditions. What should I know about menopause? During menopause, you may experience a number of symptoms, such as:  Moderate-to-severe hot flashes.  Night sweats.  Decrease in sex drive.  Mood swings.  Headaches.  Tiredness.  Irritability.  Memory problems.  Insomnia.  Choosing to treat or not to treat menopausal changes is an individual decision that you make with your health care provider. What should I know about hormone replacement therapy and supplements? Hormone therapy products are effective for treating symptoms that are associated with menopause, such as hot flashes and night sweats. Hormone replacement carries certain risks, especially as you become older. If you are thinking about using estrogen or estrogen with progestin treatments, discuss the benefits and risks with your health care provider. What should I know about heart disease  and stroke? Heart disease, heart attack, and stroke become more likely as you age. This may be due, in part, to the hormonal changes that your body experiences during menopause. These can affect how your body processes dietary fats, triglycerides, and cholesterol. Heart attack and stroke are both medical emergencies. There are many things that you can do to help prevent heart disease and stroke:  Have your blood pressure checked at least every 1-2 years. High blood pressure causes heart disease and increases the risk of stroke.  If you are 70-1 years old, ask your health care provider if you should take aspirin to prevent a heart attack or a stroke.  Do not use any tobacco products, including cigarettes, chewing tobacco, or electronic cigarettes. If you need help quitting, ask your health care provider.  It is important to eat a healthy diet and maintain a healthy weight. ? Be sure to include plenty of vegetables, fruits, low-fat dairy products, and lean protein. ? Avoid eating foods that are high in solid fats, added sugars, or salt (sodium).  Get regular exercise. This is one of the most important things that you can do for your health. ? Try to exercise for at least 150 minutes each week. The type of exercise that you do should increase your heart rate and make you sweat. This is known as moderate-intensity exercise. ? Try to do strengthening exercises at least twice each week. Do these in addition to the moderate-intensity exercise.  Know your numbers.Ask your health care provider to check your cholesterol and your blood glucose. Continue to have your blood tested as directed by your health care provider.  What  should I know about cancer screening? There are several types of cancer. Take the following steps to reduce your risk and to catch any cancer development as early as possible. Breast Cancer  Practice breast self-awareness. ? This means understanding how your breasts normally  appear and feel. ? It also means doing regular breast self-exams. Let your health care provider know about any changes, no matter how small.  If you are 83 or older, have a clinician do a breast exam (clinical breast exam or CBE) every year. Depending on your age, family history, and medical history, it may be recommended that you also have a yearly breast X-ray (mammogram).  If you have a family history of breast cancer, talk with your health care provider about genetic screening.  If you are at high risk for breast cancer, talk with your health care provider about having an MRI and a mammogram every year.  Breast cancer (BRCA) gene test is recommended for women who have family members with BRCA-related cancers. Results of the assessment will determine the need for genetic counseling and BRCA1 and for BRCA2 testing. BRCA-related cancers include these types: ? Breast. This occurs in males or females. ? Ovarian. ? Tubal. This may also be called fallopian tube cancer. ? Cancer of the abdominal or pelvic lining (peritoneal cancer). ? Prostate. ? Pancreatic.  Cervical, Uterine, and Ovarian Cancer Your health care provider may recommend that you be screened regularly for cancer of the pelvic organs. These include your ovaries, uterus, and vagina. This screening involves a pelvic exam, which includes checking for microscopic changes to the surface of your cervix (Pap test).  For women ages 21-65, health care providers may recommend a pelvic exam and a Pap test every three years. For women ages 38-65, they may recommend the Pap test and pelvic exam, combined with testing for human papilloma virus (HPV), every five years. Some types of HPV increase your risk of cervical cancer. Testing for HPV may also be done on women of any age who have unclear Pap test results.  Other health care providers may not recommend any screening for nonpregnant women who are considered low risk for pelvic cancer and have no  symptoms. Ask your health care provider if a screening pelvic exam is right for you.  If you have had past treatment for cervical cancer or a condition that could lead to cancer, you need Pap tests and screening for cancer for at least 20 years after your treatment. If Pap tests have been discontinued for you, your risk factors (such as having a new sexual partner) need to be reassessed to determine if you should start having screenings again. Some women have medical problems that increase the chance of getting cervical cancer. In these cases, your health care provider may recommend that you have screening and Pap tests more often.  If you have a family history of uterine cancer or ovarian cancer, talk with your health care provider about genetic screening.  If you have vaginal bleeding after reaching menopause, tell your health care provider.  There are currently no reliable tests available to screen for ovarian cancer.  Lung Cancer Lung cancer screening is recommended for adults 61-69 years old who are at high risk for lung cancer because of a history of smoking. A yearly low-dose CT scan of the lungs is recommended if you:  Currently smoke.  Have a history of at least 30 pack-years of smoking and you currently smoke or have quit within the past 15  years. A pack-year is smoking an average of one pack of cigarettes per day for one year.  Yearly screening should:  Continue until it has been 15 years since you quit.  Stop if you develop a health problem that would prevent you from having lung cancer treatment.  Colorectal Cancer  This type of cancer can be detected and can often be prevented.  Routine colorectal cancer screening usually begins at age 54 and continues through age 25.  If you have risk factors for colon cancer, your health care provider may recommend that you be screened at an earlier age.  If you have a family history of colorectal cancer, talk with your health care  provider about genetic screening.  Your health care provider may also recommend using home test kits to check for hidden blood in your stool.  A small camera at the end of a tube can be used to examine your colon directly (sigmoidoscopy or colonoscopy). This is done to check for the earliest forms of colorectal cancer.  Direct examination of the colon should be repeated every 5-10 years until age 32. However, if early forms of precancerous polyps or small growths are found or if you have a family history or genetic risk for colorectal cancer, you may need to be screened more often.  Skin Cancer  Check your skin from head to toe regularly.  Monitor any moles. Be sure to tell your health care provider: ? About any new moles or changes in moles, especially if there is a change in a mole's shape or color. ? If you have a mole that is larger than the size of a pencil eraser.  If any of your family members has a history of skin cancer, especially at a young age, talk with your health care provider about genetic screening.  Always use sunscreen. Apply sunscreen liberally and repeatedly throughout the day.  Whenever you are outside, protect yourself by wearing long sleeves, pants, a wide-brimmed hat, and sunglasses.  What should I know about osteoporosis? Osteoporosis is a condition in which bone destruction happens more quickly than new bone creation. After menopause, you may be at an increased risk for osteoporosis. To help prevent osteoporosis or the bone fractures that can happen because of osteoporosis, the following is recommended:  If you are 20-59 years old, get at least 1,000 mg of calcium and at least 600 mg of vitamin D per day.  If you are older than age 75 but younger than age 66, get at least 1,200 mg of calcium and at least 600 mg of vitamin D per day.  If you are older than age 28, get at least 1,200 mg of calcium and at least 800 mg of vitamin D per day.  Smoking and excessive  alcohol intake increase the risk of osteoporosis. Eat foods that are rich in calcium and vitamin D, and do weight-bearing exercises several times each week as directed by your health care provider. What should I know about how menopause affects my mental health? Depression may occur at any age, but it is more common as you become older. Common symptoms of depression include:  Low or sad mood.  Changes in sleep patterns.  Changes in appetite or eating patterns.  Feeling an overall lack of motivation or enjoyment of activities that you previously enjoyed.  Frequent crying spells.  Talk with your health care provider if you think that you are experiencing depression. What should I know about immunizations? It is important that  you get and maintain your immunizations. These include:  Tetanus, diphtheria, and pertussis (Tdap) booster vaccine.  Influenza every year before the flu season begins.  Pneumonia vaccine.  Shingles vaccine.  Your health care provider may also recommend other immunizations. This information is not intended to replace advice given to you by your health care provider. Make sure you discuss any questions you have with your health care provider. Document Released: 12/23/2005 Document Revised: 05/20/2016 Document Reviewed: 08/04/2015 Elsevier Interactive Patient Education  2018 [employer].

## 2018-04-19 ENCOUNTER — Other Ambulatory Visit: Payer: Self-pay | Admitting: Internal Medicine

## 2018-05-22 ENCOUNTER — Other Ambulatory Visit: Payer: Self-pay | Admitting: Family Medicine

## 2018-05-22 NOTE — Telephone Encounter (Signed)
Name of Medication: Trazodone Name of Pharmacy: [facility] or Written Date:10/31/17 #135/1 Last Office Visit and Type: 01/10/18/actue Next Office Visit and Type:none Last Controlled Substance Agreement Date: none Last WMG:EEAT

## 2018-05-23 NOTE — Telephone Encounter (Signed)
Sent. Thanks.   

## 2018-06-04 ENCOUNTER — Ambulatory Visit
Admission: RE | Admit: 2018-06-04 | Discharge: 2018-06-04 | Disposition: A | Discharge status: Home / Self Care | Payer: Medicare Other | Source: Ambulatory Visit | Attending: General Surgery | Admitting: General Surgery

## 2018-06-04 DIAGNOSIS — C50411 Malignant neoplasm of upper-outer quadrant of right female breast: Secondary | ICD-10-CM | POA: Diagnosis not present

## 2018-06-04 DIAGNOSIS — Z170 Estrogen receptor positive status [ER+]: Principal | ICD-10-CM

## 2018-06-04 DIAGNOSIS — R928 Other abnormal and inconclusive findings on diagnostic imaging of breast: Secondary | ICD-10-CM | POA: Diagnosis not present

## 2018-06-04 DIAGNOSIS — Z853 Personal history of malignant neoplasm of breast: Secondary | ICD-10-CM | POA: Diagnosis not present

## 2018-06-04 HISTORY — DX: Personal history of irradiation: Z92.3

## 2018-06-12 ENCOUNTER — Ambulatory Visit (INDEPENDENT_AMBULATORY_CARE_PROVIDER_SITE_OTHER): Payer: Medicare Other | Admitting: General Surgery

## 2018-06-12 ENCOUNTER — Encounter: Payer: Self-pay | Admitting: General Surgery

## 2018-06-12 VITALS — BP 142/82 | HR 94 | Resp 12 | Ht 64.0 in | Wt 164.0 lb

## 2018-06-12 DIAGNOSIS — C50411 Malignant neoplasm of upper-outer quadrant of right female breast: Secondary | ICD-10-CM

## 2018-06-12 DIAGNOSIS — Z170 Estrogen receptor positive status [ER+]: Secondary | ICD-10-CM

## 2018-06-12 NOTE — Progress Notes (Signed)
Patient ID: Alyssa Nolan, female   DOB: Sep 26, 1941, 77 y.o.   MRN: [id number]  Chief Complaint  Patient presents with  . Follow-up    HPI Alyssa Nolan is a 77 y.o. female who presents for a breast evaluation. The most recent mammogram was done on 06/04/2018.  Patient does perform regular self breast checks and gets regular mammograms done.    HPI  Past Medical History:  Diagnosis Date  . Arthritis 2011   R hip injection per Dr. Nelva Bush  . 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 INVASIVE DUCTAL CARCINOMA., DCIS  . GERD (gastroesophageal reflux disease)   . History of chickenpox   . Hyperlipidemia   . Hypertension   . Lichen planus   . Osteopenia    Tscore -1, consider repeat in ~2013, per Dr. Amalia Hailey with gyn  . Personal history of radiation therapy 2017   mammosite  . 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 BASAL CELL CARCINOMA EXCISION  01-22-14   chest area. Dr Nehemiah Massed  . BREAST BIOPSY Right 2013   CORE - NEG, cyst with ductal hyperplasia without atypia Dr Bary Castilla  . BREAST BIOPSY Right 04-28-16   Invasive ductal carcinoma  . BREAST CYST ASPIRATION Bilateral   . BREAST EXCISIONAL BIOPSY Right 1968   NEG  . BREAST LUMPECTOMY Right 2017   f/u mammosite  . BREAST LUMPECTOMY WITH NEEDLE LOCALIZATION Right 06/10/2016   Procedure: BREAST LUMPECTOMY WITH NEEDLE LOCALIZATION;  Surgeon: Robert Bellow, MD;  Location: [facility] ORS;  Service: General;  Laterality: Right;  . BREAST LUMPECTOMY WITH SENTINEL LYMPH NODE BIOPSY Right 06/10/2016   Procedure: BREAST LUMPECTOMY WITH SENTINEL LYMPH NODE BX / WITH RECONSTRUCTION;  Surgeon: Robert Bellow, MD;  Location: [facility] ORS;  Service: General;  Laterality: Right;  . BREAST SURGERY  1968   Fibroadenoma  . CARDIOVASCULAR STRESS TEST  02/07/2002  . [place]  . CHOLECYSTECTOMY  2005   Dr Helane Rima, [place]  . COLONOSCOPY   04-22-02   Dr Ricarda Frame, [place]  . COLONOSCOPY  09-02-12   Dr Bary Castilla  . Endometrial ablation and right ovary removal  2002  . MRI, hip  01/12/2004  . MRI, neck  06/23/1998  10/31/2003  . OOPHORECTOMY Bilateral 15+ YRS AGO  . Removal of excess eyelids and shortening of eyelid tendons  01/2010  . 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 Right hip injection  03/30/2009  . Touch Up of excess eyelids  04/20/2010    Family History  Problem Relation Age of Onset  . COPD Mother        Smoker  . Heart disease Father        Possible CHF  . Stroke Maternal Grandfather 83       CVA  . Colon cancer Unknown        mother's 1/2 sister  . Breast cancer Neg Hx   . Diabetes Neg Hx     Social History Social History   Tobacco Use  . Smoking status: Former Smoker    Types: Cigarettes    Last attempt to quit: 11/14/1969    Years since quitting: 48.6  . Smokeless tobacco: Never Used  Substance Use Topics  . Alcohol use: Yes    Comment: Rare  . Drug use: No    Allergies  Allergen Reactions  .  Codeine     REACTION: Felt like she was going to "pass out"    Current Outpatient Medications  Medication Sig Dispense Refill  . atorvastatin (LIPITOR) 20 MG tablet TAKE 1 TABLET (20 MG TOTAL) BY MOUTH DAILY. 90 tablet 2  . Calcium Carbonate-Vitamin D (CALCIUM 600/VITAMIN D) 600-400 MG-UNIT per tablet Take 1 tablet by mouth 2 (two) times daily.     . carisoprodol (SOMA) 350 MG tablet TAKE 1 TABLET BY MOUTH AT BEDTIME 90 tablet 1  . clobetasol ointment (TEMOVATE) 7.06 % Apply 1 application topically every other day. 30 g 2  . diclofenac sodium (VOLTAREN) 1 % GEL APPLY 2G TOPICALLY FOUR TIMES DAILY (Patient taking differently: APPLY 2G TOPICALLY FOUR TIMES DAILY AS NEEDED) 100 g 3  . letrozole (FEMARA) 2.5 MG tablet TAKE 1 TABLET (2.5 MG TOTAL) BY MOUTH DAILY. 90 tablet 3  . loratadine (CLARITIN) 10 MG tablet Take 10 mg by mouth daily.      . meloxicam (MOBIC) 15 MG tablet Take 15 mg by  mouth as needed for pain.    Marland Kitchen omeprazole (PRILOSEC) 20 MG capsule TAKE 1 CAPSULE BY MOUTH EVERY DAY 90 capsule 1  . polyethylene glycol powder (GLYCOLAX/MIRALAX) powder TAKE 1 CAPFUL(17G) BY MOUTH DAILY 1530 g 3  . Propylene Glycol (SYSTANE BALANCE OP) Place 1 drop into both eyes daily.    . traMADol-acetaminophen (ULTRACET) 37.5-325 MG tablet TAKE 2 TABLETS BY MOUTH AT BEDTIME 180 tablet 1  . traZODone (DESYREL) 100 MG tablet TAKE 1 AND 1/2 TABLETS     (=150MG TOTAL) AT BEDTIME 135 tablet 1  . triamterene-hydrochlorothiazide (DYAZIDE) 37.5-25 MG capsule TAKE ONE CAPSULE BY MOUTH IN THE MORNING 90 capsule 2  . vitamin E 100 UNIT capsule Take by mouth daily.     No current facility-administered medications for this visit.     Review of Systems Review of Systems  Constitutional: Negative.   Respiratory: Negative.   Cardiovascular: Negative.     Blood pressure (!) 142/82, pulse 94, resp. rate 12, height 5' 4"  (1.626 m), weight 164 lb (74.4 kg).  Physical Exam Physical Exam  Constitutional: She is oriented to person, place, and time. She appears well-developed and well-nourished.  Eyes: Conjunctivae are normal. No scleral icterus.  Neck: Neck supple.  Cardiovascular: Normal rate, regular rhythm and normal heart sounds.  Pulmonary/Chest: Effort normal and breath sounds normal. Right breast exhibits no inverted nipple, no mass, no nipple discharge, no skin change and no tenderness. Left breast exhibits no inverted nipple, no mass, no nipple discharge, no skin change and no tenderness.    Lymphadenopathy:    She has no cervical adenopathy.    She has no axillary adenopathy.  Neurological: She is alert and oriented to person, place, and time.  Skin: Skin is warm and dry.    Data Reviewed Bilateral diagnostic mammograms dated June 04, 2018 were reviewed.  Postsurgical changes.  BI-RADS-2.  Assessment    Stable breast exam.  Good tolerance of letrozole therapy.     Plan    The patient has been asked to return to the office in one year with a bilateral diagnostic mammogram. The patient is aware to call back for any questions or concerns.   HPI, Physical Exam, Assessment and Plan have been scribed under the direction and in the presence of Hervey Ard, MD.  Gaspar Cola, CMA  I have completed the exam and reviewed the above documentation for accuracy and completeness.  I agree with the above.  Haematologist has been used and any errors in dictation or transcription are unintentional.  Hervey Ard, M.D., F.A.C.S.   Forest Gleason Ravi Tuccillo 06/13/2018, 9:07 PM

## 2018-06-12 NOTE — Patient Instructions (Addendum)
The patient has been asked to return to the office in one year with a bilateral diagnostic mammogram.The patient is aware to call back for any questions or concerns. 

## 2018-06-13 ENCOUNTER — Encounter: Payer: Self-pay | Admitting: General Surgery

## 2018-06-26 DIAGNOSIS — M9901 Segmental and somatic dysfunction of cervical region: Secondary | ICD-10-CM | POA: Diagnosis not present

## 2018-06-26 DIAGNOSIS — R51 Headache: Secondary | ICD-10-CM | POA: Diagnosis not present

## 2018-06-26 DIAGNOSIS — M9902 Segmental and somatic dysfunction of thoracic region: Secondary | ICD-10-CM | POA: Diagnosis not present

## 2018-06-26 DIAGNOSIS — M5033 Other cervical disc degeneration, cervicothoracic region: Secondary | ICD-10-CM | POA: Diagnosis not present

## 2018-06-27 DIAGNOSIS — M9902 Segmental and somatic dysfunction of thoracic region: Secondary | ICD-10-CM | POA: Diagnosis not present

## 2018-06-27 DIAGNOSIS — R51 Headache: Secondary | ICD-10-CM | POA: Diagnosis not present

## 2018-06-27 DIAGNOSIS — M9901 Segmental and somatic dysfunction of cervical region: Secondary | ICD-10-CM | POA: Diagnosis not present

## 2018-06-27 DIAGNOSIS — M5033 Other cervical disc degeneration, cervicothoracic region: Secondary | ICD-10-CM | POA: Diagnosis not present

## 2018-06-28 DIAGNOSIS — M9901 Segmental and somatic dysfunction of cervical region: Secondary | ICD-10-CM | POA: Diagnosis not present

## 2018-06-28 DIAGNOSIS — R51 Headache: Secondary | ICD-10-CM | POA: Diagnosis not present

## 2018-06-28 DIAGNOSIS — M5033 Other cervical disc degeneration, cervicothoracic region: Secondary | ICD-10-CM | POA: Diagnosis not present

## 2018-06-28 DIAGNOSIS — M9902 Segmental and somatic dysfunction of thoracic region: Secondary | ICD-10-CM | POA: Diagnosis not present

## 2018-07-02 DIAGNOSIS — M9901 Segmental and somatic dysfunction of cervical region: Secondary | ICD-10-CM | POA: Diagnosis not present

## 2018-07-02 DIAGNOSIS — M5033 Other cervical disc degeneration, cervicothoracic region: Secondary | ICD-10-CM | POA: Diagnosis not present

## 2018-07-02 DIAGNOSIS — R51 Headache: Secondary | ICD-10-CM | POA: Diagnosis not present

## 2018-07-02 DIAGNOSIS — M9902 Segmental and somatic dysfunction of thoracic region: Secondary | ICD-10-CM | POA: Diagnosis not present

## 2018-07-04 DIAGNOSIS — M9901 Segmental and somatic dysfunction of cervical region: Secondary | ICD-10-CM | POA: Diagnosis not present

## 2018-07-04 DIAGNOSIS — M9902 Segmental and somatic dysfunction of thoracic region: Secondary | ICD-10-CM | POA: Diagnosis not present

## 2018-07-04 DIAGNOSIS — R51 Headache: Secondary | ICD-10-CM | POA: Diagnosis not present

## 2018-07-04 DIAGNOSIS — M5033 Other cervical disc degeneration, cervicothoracic region: Secondary | ICD-10-CM | POA: Diagnosis not present

## 2018-07-05 DIAGNOSIS — M9902 Segmental and somatic dysfunction of thoracic region: Secondary | ICD-10-CM | POA: Diagnosis not present

## 2018-07-05 DIAGNOSIS — M5033 Other cervical disc degeneration, cervicothoracic region: Secondary | ICD-10-CM | POA: Diagnosis not present

## 2018-07-05 DIAGNOSIS — R51 Headache: Secondary | ICD-10-CM | POA: Diagnosis not present

## 2018-07-05 DIAGNOSIS — M9901 Segmental and somatic dysfunction of cervical region: Secondary | ICD-10-CM | POA: Diagnosis not present

## 2018-07-09 DIAGNOSIS — M5033 Other cervical disc degeneration, cervicothoracic region: Secondary | ICD-10-CM | POA: Diagnosis not present

## 2018-07-09 DIAGNOSIS — M9901 Segmental and somatic dysfunction of cervical region: Secondary | ICD-10-CM | POA: Diagnosis not present

## 2018-07-09 DIAGNOSIS — R51 Headache: Secondary | ICD-10-CM | POA: Diagnosis not present

## 2018-07-09 DIAGNOSIS — M9902 Segmental and somatic dysfunction of thoracic region: Secondary | ICD-10-CM | POA: Diagnosis not present

## 2018-07-11 DIAGNOSIS — M9901 Segmental and somatic dysfunction of cervical region: Secondary | ICD-10-CM | POA: Diagnosis not present

## 2018-07-11 DIAGNOSIS — M9902 Segmental and somatic dysfunction of thoracic region: Secondary | ICD-10-CM | POA: Diagnosis not present

## 2018-07-11 DIAGNOSIS — M5033 Other cervical disc degeneration, cervicothoracic region: Secondary | ICD-10-CM | POA: Diagnosis not present

## 2018-07-11 DIAGNOSIS — R51 Headache: Secondary | ICD-10-CM | POA: Diagnosis not present

## 2018-07-13 DIAGNOSIS — M5033 Other cervical disc degeneration, cervicothoracic region: Secondary | ICD-10-CM | POA: Diagnosis not present

## 2018-07-13 DIAGNOSIS — R51 Headache: Secondary | ICD-10-CM | POA: Diagnosis not present

## 2018-07-13 DIAGNOSIS — M9902 Segmental and somatic dysfunction of thoracic region: Secondary | ICD-10-CM | POA: Diagnosis not present

## 2018-07-13 DIAGNOSIS — M9901 Segmental and somatic dysfunction of cervical region: Secondary | ICD-10-CM | POA: Diagnosis not present

## 2018-07-18 DIAGNOSIS — R51 Headache: Secondary | ICD-10-CM | POA: Diagnosis not present

## 2018-07-18 DIAGNOSIS — M5033 Other cervical disc degeneration, cervicothoracic region: Secondary | ICD-10-CM | POA: Diagnosis not present

## 2018-07-18 DIAGNOSIS — M9902 Segmental and somatic dysfunction of thoracic region: Secondary | ICD-10-CM | POA: Diagnosis not present

## 2018-07-18 DIAGNOSIS — M9901 Segmental and somatic dysfunction of cervical region: Secondary | ICD-10-CM | POA: Diagnosis not present

## 2018-07-20 DIAGNOSIS — M9901 Segmental and somatic dysfunction of cervical region: Secondary | ICD-10-CM | POA: Diagnosis not present

## 2018-07-20 DIAGNOSIS — M5033 Other cervical disc degeneration, cervicothoracic region: Secondary | ICD-10-CM | POA: Diagnosis not present

## 2018-07-20 DIAGNOSIS — M9902 Segmental and somatic dysfunction of thoracic region: Secondary | ICD-10-CM | POA: Diagnosis not present

## 2018-07-20 DIAGNOSIS — R51 Headache: Secondary | ICD-10-CM | POA: Diagnosis not present

## 2018-07-24 DIAGNOSIS — M9901 Segmental and somatic dysfunction of cervical region: Secondary | ICD-10-CM | POA: Diagnosis not present

## 2018-07-24 DIAGNOSIS — M9902 Segmental and somatic dysfunction of thoracic region: Secondary | ICD-10-CM | POA: Diagnosis not present

## 2018-07-24 DIAGNOSIS — R51 Headache: Secondary | ICD-10-CM | POA: Diagnosis not present

## 2018-07-24 DIAGNOSIS — M5033 Other cervical disc degeneration, cervicothoracic region: Secondary | ICD-10-CM | POA: Diagnosis not present

## 2018-07-26 DIAGNOSIS — R51 Headache: Secondary | ICD-10-CM | POA: Diagnosis not present

## 2018-07-26 DIAGNOSIS — M5033 Other cervical disc degeneration, cervicothoracic region: Secondary | ICD-10-CM | POA: Diagnosis not present

## 2018-07-26 DIAGNOSIS — M9901 Segmental and somatic dysfunction of cervical region: Secondary | ICD-10-CM | POA: Diagnosis not present

## 2018-07-26 DIAGNOSIS — M9902 Segmental and somatic dysfunction of thoracic region: Secondary | ICD-10-CM | POA: Diagnosis not present

## 2018-07-31 ENCOUNTER — Other Ambulatory Visit: Payer: Self-pay | Admitting: Family Medicine

## 2018-07-31 DIAGNOSIS — M9902 Segmental and somatic dysfunction of thoracic region: Secondary | ICD-10-CM | POA: Diagnosis not present

## 2018-07-31 DIAGNOSIS — M5033 Other cervical disc degeneration, cervicothoracic region: Secondary | ICD-10-CM | POA: Diagnosis not present

## 2018-07-31 DIAGNOSIS — M9901 Segmental and somatic dysfunction of cervical region: Secondary | ICD-10-CM | POA: Diagnosis not present

## 2018-07-31 DIAGNOSIS — R51 Headache: Secondary | ICD-10-CM | POA: Diagnosis not present

## 2018-07-31 NOTE — Telephone Encounter (Signed)
Electronic refill request Last office visit 01/10/18/acute Last refill 05/23/18 #135/1

## 2018-08-01 NOTE — Telephone Encounter (Signed)
Sent. Thanks.   

## 2018-08-01 NOTE — Telephone Encounter (Signed)
Name of Medication: Tramadol and Carisoprodol [facility] Last Fill or Written Date and Quantity: Last refill Tramadol 01/30/18 #180/1, Carisoprodol #90/1 Last Office Visit and Type: Last office visit 01/10/18/acute Next Office Visit and Type: None scheduled Last Controlled Substance Agreement Date: None Last KOR:JGYL

## 2018-08-02 ENCOUNTER — Other Ambulatory Visit: Payer: Self-pay | Admitting: Family Medicine

## 2018-08-02 DIAGNOSIS — R51 Headache: Secondary | ICD-10-CM | POA: Diagnosis not present

## 2018-08-02 DIAGNOSIS — M5033 Other cervical disc degeneration, cervicothoracic region: Secondary | ICD-10-CM | POA: Diagnosis not present

## 2018-08-02 DIAGNOSIS — M9901 Segmental and somatic dysfunction of cervical region: Secondary | ICD-10-CM | POA: Diagnosis not present

## 2018-08-02 DIAGNOSIS — M9902 Segmental and somatic dysfunction of thoracic region: Secondary | ICD-10-CM | POA: Diagnosis not present

## 2018-08-02 NOTE — Telephone Encounter (Signed)
Denied. RX already sent for #135, 1 refill on 08/01/18 to [facility] per electronic refill request.

## 2018-08-07 DIAGNOSIS — M9901 Segmental and somatic dysfunction of cervical region: Secondary | ICD-10-CM | POA: Diagnosis not present

## 2018-08-07 DIAGNOSIS — M5033 Other cervical disc degeneration, cervicothoracic region: Secondary | ICD-10-CM | POA: Diagnosis not present

## 2018-08-07 DIAGNOSIS — R51 Headache: Secondary | ICD-10-CM | POA: Diagnosis not present

## 2018-08-07 DIAGNOSIS — M9902 Segmental and somatic dysfunction of thoracic region: Secondary | ICD-10-CM | POA: Diagnosis not present

## 2018-08-10 DIAGNOSIS — R51 Headache: Secondary | ICD-10-CM | POA: Diagnosis not present

## 2018-08-10 DIAGNOSIS — M9902 Segmental and somatic dysfunction of thoracic region: Secondary | ICD-10-CM | POA: Diagnosis not present

## 2018-08-10 DIAGNOSIS — M9901 Segmental and somatic dysfunction of cervical region: Secondary | ICD-10-CM | POA: Diagnosis not present

## 2018-08-10 DIAGNOSIS — M5033 Other cervical disc degeneration, cervicothoracic region: Secondary | ICD-10-CM | POA: Diagnosis not present

## 2018-08-13 DIAGNOSIS — M5033 Other cervical disc degeneration, cervicothoracic region: Secondary | ICD-10-CM | POA: Diagnosis not present

## 2018-08-13 DIAGNOSIS — R51 Headache: Secondary | ICD-10-CM | POA: Diagnosis not present

## 2018-08-13 DIAGNOSIS — M9901 Segmental and somatic dysfunction of cervical region: Secondary | ICD-10-CM | POA: Diagnosis not present

## 2018-08-13 DIAGNOSIS — M9902 Segmental and somatic dysfunction of thoracic region: Secondary | ICD-10-CM | POA: Diagnosis not present

## 2018-08-15 DIAGNOSIS — R51 Headache: Secondary | ICD-10-CM | POA: Diagnosis not present

## 2018-08-15 DIAGNOSIS — M9902 Segmental and somatic dysfunction of thoracic region: Secondary | ICD-10-CM | POA: Diagnosis not present

## 2018-08-15 DIAGNOSIS — M9901 Segmental and somatic dysfunction of cervical region: Secondary | ICD-10-CM | POA: Diagnosis not present

## 2018-08-15 DIAGNOSIS — M5033 Other cervical disc degeneration, cervicothoracic region: Secondary | ICD-10-CM | POA: Diagnosis not present

## 2018-08-22 DIAGNOSIS — R51 Headache: Secondary | ICD-10-CM | POA: Diagnosis not present

## 2018-08-22 DIAGNOSIS — M9901 Segmental and somatic dysfunction of cervical region: Secondary | ICD-10-CM | POA: Diagnosis not present

## 2018-08-22 DIAGNOSIS — M5033 Other cervical disc degeneration, cervicothoracic region: Secondary | ICD-10-CM | POA: Diagnosis not present

## 2018-08-22 DIAGNOSIS — M9902 Segmental and somatic dysfunction of thoracic region: Secondary | ICD-10-CM | POA: Diagnosis not present

## 2018-08-23 ENCOUNTER — Other Ambulatory Visit: Payer: Self-pay | Admitting: General Surgery

## 2018-08-23 DIAGNOSIS — C50411 Malignant neoplasm of upper-outer quadrant of right female breast: Secondary | ICD-10-CM

## 2018-08-29 DIAGNOSIS — M9901 Segmental and somatic dysfunction of cervical region: Secondary | ICD-10-CM | POA: Diagnosis not present

## 2018-08-29 DIAGNOSIS — R51 Headache: Secondary | ICD-10-CM | POA: Diagnosis not present

## 2018-08-29 DIAGNOSIS — M9902 Segmental and somatic dysfunction of thoracic region: Secondary | ICD-10-CM | POA: Diagnosis not present

## 2018-08-29 DIAGNOSIS — M5033 Other cervical disc degeneration, cervicothoracic region: Secondary | ICD-10-CM | POA: Diagnosis not present

## 2018-09-03 DIAGNOSIS — R51 Headache: Secondary | ICD-10-CM | POA: Diagnosis not present

## 2018-09-03 DIAGNOSIS — M5033 Other cervical disc degeneration, cervicothoracic region: Secondary | ICD-10-CM | POA: Diagnosis not present

## 2018-09-03 DIAGNOSIS — M9902 Segmental and somatic dysfunction of thoracic region: Secondary | ICD-10-CM | POA: Diagnosis not present

## 2018-09-03 DIAGNOSIS — M9901 Segmental and somatic dysfunction of cervical region: Secondary | ICD-10-CM | POA: Diagnosis not present

## 2018-09-10 DIAGNOSIS — R51 Headache: Secondary | ICD-10-CM | POA: Diagnosis not present

## 2018-09-10 DIAGNOSIS — M9901 Segmental and somatic dysfunction of cervical region: Secondary | ICD-10-CM | POA: Diagnosis not present

## 2018-09-10 DIAGNOSIS — M9902 Segmental and somatic dysfunction of thoracic region: Secondary | ICD-10-CM | POA: Diagnosis not present

## 2018-09-10 DIAGNOSIS — M5033 Other cervical disc degeneration, cervicothoracic region: Secondary | ICD-10-CM | POA: Diagnosis not present

## 2018-09-24 DIAGNOSIS — M9901 Segmental and somatic dysfunction of cervical region: Secondary | ICD-10-CM | POA: Diagnosis not present

## 2018-09-24 DIAGNOSIS — R51 Headache: Secondary | ICD-10-CM | POA: Diagnosis not present

## 2018-09-24 DIAGNOSIS — M9902 Segmental and somatic dysfunction of thoracic region: Secondary | ICD-10-CM | POA: Diagnosis not present

## 2018-09-24 DIAGNOSIS — M5033 Other cervical disc degeneration, cervicothoracic region: Secondary | ICD-10-CM | POA: Diagnosis not present

## 2018-09-26 ENCOUNTER — Encounter: Payer: Self-pay | Admitting: Radiation Oncology

## 2018-09-26 ENCOUNTER — Ambulatory Visit
Admission: RE | Admit: 2018-09-26 | Discharge: 2018-09-26 | Disposition: A | Discharge status: Home / Self Care | Payer: Medicare Other | Source: Ambulatory Visit | Attending: Radiation Oncology | Admitting: Radiation Oncology

## 2018-09-26 ENCOUNTER — Other Ambulatory Visit: Payer: Self-pay

## 2018-09-26 VITALS — BP 133/78 | HR 80 | Resp 18 | Wt 168.8 lb

## 2018-09-26 DIAGNOSIS — G629 Polyneuropathy, unspecified: Secondary | ICD-10-CM | POA: Diagnosis not present

## 2018-09-26 DIAGNOSIS — Z923 Personal history of irradiation: Secondary | ICD-10-CM | POA: Insufficient documentation

## 2018-09-26 DIAGNOSIS — Z170 Estrogen receptor positive status [ER+]: Secondary | ICD-10-CM | POA: Insufficient documentation

## 2018-09-26 DIAGNOSIS — Z79811 Long term (current) use of aromatase inhibitors: Secondary | ICD-10-CM | POA: Diagnosis not present

## 2018-09-26 DIAGNOSIS — D0511 Intraductal carcinoma in situ of right breast: Secondary | ICD-10-CM | POA: Diagnosis not present

## 2018-09-26 DIAGNOSIS — C50411 Malignant neoplasm of upper-outer quadrant of right female breast: Secondary | ICD-10-CM

## 2018-09-26 NOTE — Progress Notes (Signed)
Radiation Oncology Follow up Note  Name: Alyssa Nolan   Date:   09/26/2018 MRN:  [id number] DOB: January 26, 1941    This 77 y.o. female presents to the clinic today for 2 year follow-up status post accelerated partial breast radiation to her right breast for ER/PR positive ductal carcinoma in situ.  REFERRING PROVIDER: Tonia Ghent, MD  HPI: patient is a 77 year old female now 2 years out having completed accelerated partial breast radiation to her right breast for ER/PR positive ductal carcinoma in situ. She is seen today in routine follow-up and is doing well. She specifically denies breast tenderness cough or bone pain. Occasionally does have a little twinge in her breast consistent with neuropathy..her last mammograms which I have reviewed will back in July were BI-RADS 2 benign.she's currently on Femara tolerating that well without side effect.  COMPLICATIONS OF TREATMENT: none  FOLLOW UP COMPLIANCE: keeps appointments   PHYSICAL EXAM:  BP 133/78 (BP Location: Left Arm, Patient Position: Sitting)   Pulse 80   Resp 18   Wt 168 lb 12.2 oz (76.5 kg)   BMI 28.97 kg/m  Lungs are clear to A&P cardiac examination essentially unremarkable with regular rate and rhythm. No dominant mass or nodularity is noted in either breast in 2 positions examined. Incision is well-healed. No axillary or supraclavicular adenopathy is appreciated. Cosmetic result is excellent.Well-developed well-nourished patient in NAD. HEENT reveals PERLA, EOMI, discs not visualized.  Oral cavity is clear. No oral mucosal lesions are identified. Neck is clear without evidence of cervical or supraclavicular adenopathy. Lungs are clear to A&P. Cardiac examination is essentially unremarkable with regular rate and rhythm without murmur rub or thrill. Abdomen is benign with no organomegaly or masses noted. Motor sensory and DTR levels are equal and symmetric in the upper and lower extremities. Cranial nerves II through XII  are grossly intact. Proprioception is intact. No peripheral adenopathy or edema is identified. No motor or sensory levels are noted. Crude visual fields are within normal range.  RADIOLOGY RESULTS: mammograms are reviewed and compatible with the above-stated findings  PLAN: present time patient is doing well with no evidence of disease. I'm please were overall progress. I've asked to see her back in 1 year for follow-up. She continues on Femara without side effect. Patient is to call any concerns.  I would like to take this opportunity to thank you for allowing me to participate in the care of your patient.Noreene Filbert, MD

## 2018-09-27 ENCOUNTER — Other Ambulatory Visit: Payer: Self-pay | Admitting: Family Medicine

## 2018-09-27 NOTE — Telephone Encounter (Signed)
Electronic refill request. Diclofenac Last office visit:   11/08/17 CPE Last Filled:    100 g 3 06/06/2017  Please advise.

## 2018-09-28 NOTE — Telephone Encounter (Signed)
Sent. Thanks.   

## 2018-10-02 ENCOUNTER — Other Ambulatory Visit: Payer: Self-pay | Admitting: *Deleted

## 2018-10-02 NOTE — Telephone Encounter (Signed)
[facility] to [facility] [place]  Please advise.  Last office visit:   11/08/17 CPE Last Filled:   [facility] tablet 1 08/01/2018

## 2018-10-03 MED ORDER — TRAZODONE HCL 100 MG PO TABS: ORAL_TABLET | ORAL | 1 refills | Status: DC

## 2018-10-03 NOTE — Telephone Encounter (Signed)
Sent. Thanks.   

## 2018-10-06 ENCOUNTER — Other Ambulatory Visit: Payer: Self-pay | Admitting: Family Medicine

## 2018-10-08 ENCOUNTER — Encounter: Payer: Self-pay | Admitting: *Deleted

## 2018-10-08 DIAGNOSIS — M9902 Segmental and somatic dysfunction of thoracic region: Secondary | ICD-10-CM | POA: Diagnosis not present

## 2018-10-08 DIAGNOSIS — R51 Headache: Secondary | ICD-10-CM | POA: Diagnosis not present

## 2018-10-08 DIAGNOSIS — Z1283 Encounter for screening for malignant neoplasm of skin: Secondary | ICD-10-CM | POA: Diagnosis not present

## 2018-10-08 DIAGNOSIS — B353 Tinea pedis: Secondary | ICD-10-CM | POA: Diagnosis not present

## 2018-10-08 DIAGNOSIS — Z85828 Personal history of other malignant neoplasm of skin: Secondary | ICD-10-CM | POA: Diagnosis not present

## 2018-10-08 DIAGNOSIS — L814 Other melanin hyperpigmentation: Secondary | ICD-10-CM | POA: Diagnosis not present

## 2018-10-08 DIAGNOSIS — M9901 Segmental and somatic dysfunction of cervical region: Secondary | ICD-10-CM | POA: Diagnosis not present

## 2018-10-08 DIAGNOSIS — L578 Other skin changes due to chronic exposure to nonionizing radiation: Secondary | ICD-10-CM | POA: Diagnosis not present

## 2018-10-08 DIAGNOSIS — I781 Nevus, non-neoplastic: Secondary | ICD-10-CM | POA: Diagnosis not present

## 2018-10-08 DIAGNOSIS — M5033 Other cervical disc degeneration, cervicothoracic region: Secondary | ICD-10-CM | POA: Diagnosis not present

## 2018-10-08 DIAGNOSIS — I8393 Asymptomatic varicose veins of bilateral lower extremities: Secondary | ICD-10-CM | POA: Diagnosis not present

## 2018-10-08 DIAGNOSIS — L821 Other seborrheic keratosis: Secondary | ICD-10-CM | POA: Diagnosis not present

## 2018-10-13 ENCOUNTER — Other Ambulatory Visit: Payer: Self-pay | Admitting: Internal Medicine

## 2018-10-22 DIAGNOSIS — M5033 Other cervical disc degeneration, cervicothoracic region: Secondary | ICD-10-CM | POA: Diagnosis not present

## 2018-10-22 DIAGNOSIS — M9902 Segmental and somatic dysfunction of thoracic region: Secondary | ICD-10-CM | POA: Diagnosis not present

## 2018-10-22 DIAGNOSIS — M9901 Segmental and somatic dysfunction of cervical region: Secondary | ICD-10-CM | POA: Diagnosis not present

## 2018-10-22 DIAGNOSIS — R51 Headache: Secondary | ICD-10-CM | POA: Diagnosis not present

## 2018-11-05 DIAGNOSIS — M9902 Segmental and somatic dysfunction of thoracic region: Secondary | ICD-10-CM | POA: Diagnosis not present

## 2018-11-05 DIAGNOSIS — M9901 Segmental and somatic dysfunction of cervical region: Secondary | ICD-10-CM | POA: Diagnosis not present

## 2018-11-05 DIAGNOSIS — R51 Headache: Secondary | ICD-10-CM | POA: Diagnosis not present

## 2018-11-05 DIAGNOSIS — M5033 Other cervical disc degeneration, cervicothoracic region: Secondary | ICD-10-CM | POA: Diagnosis not present

## 2018-11-22 ENCOUNTER — Telehealth: Payer: Self-pay | Admitting: Family Medicine

## 2018-11-22 NOTE — Telephone Encounter (Signed)
I called to r/s 11/27/18 appt for AWV with Benita Stabile due to her veing out of office.

## 2018-11-26 DIAGNOSIS — M5033 Other cervical disc degeneration, cervicothoracic region: Secondary | ICD-10-CM | POA: Diagnosis not present

## 2018-11-26 DIAGNOSIS — R51 Headache: Secondary | ICD-10-CM | POA: Diagnosis not present

## 2018-11-26 DIAGNOSIS — M9902 Segmental and somatic dysfunction of thoracic region: Secondary | ICD-10-CM | POA: Diagnosis not present

## 2018-11-26 DIAGNOSIS — M9901 Segmental and somatic dysfunction of cervical region: Secondary | ICD-10-CM | POA: Diagnosis not present

## 2018-11-27 ENCOUNTER — Ambulatory Visit: Payer: Medicare Other

## 2018-12-05 ENCOUNTER — Ambulatory Visit (INDEPENDENT_AMBULATORY_CARE_PROVIDER_SITE_OTHER): Payer: Medicare Other

## 2018-12-05 VITALS — BP 126/78 | HR 62 | Temp 97.7°F | Ht 63.0 in | Wt 167.5 lb

## 2018-12-05 DIAGNOSIS — M8580 Other specified disorders of bone density and structure, unspecified site: Secondary | ICD-10-CM | POA: Diagnosis not present

## 2018-12-05 DIAGNOSIS — Z0000 Encounter for general adult medical examination without abnormal findings: Secondary | ICD-10-CM

## 2018-12-05 DIAGNOSIS — I10 Essential (primary) hypertension: Secondary | ICD-10-CM

## 2018-12-05 DIAGNOSIS — E559 Vitamin D deficiency, unspecified: Secondary | ICD-10-CM

## 2018-12-05 DIAGNOSIS — E785 Hyperlipidemia, unspecified: Secondary | ICD-10-CM | POA: Diagnosis not present

## 2018-12-05 DIAGNOSIS — R7989 Other specified abnormal findings of blood chemistry: Secondary | ICD-10-CM

## 2018-12-05 LAB — LIPID PANEL
Cholesterol: 219 mg/dL — ABNORMAL HIGH (ref 0–200)
HDL: 39.4 mg/dL (ref >39.00)
NONHDL: 179.49
Total CHOL/HDL Ratio: 6
Triglycerides: 271 mg/dL — ABNORMAL HIGH (ref 0.0–149.0)
VLDL: 54.2 mg/dL — ABNORMAL HIGH (ref 0.0–40.0)

## 2018-12-05 LAB — COMPREHENSIVE METABOLIC PANEL
ALBUMIN: 4.2 g/dL (ref 3.5–5.2)
ALK PHOS: 68 U/L (ref 39–117)
ALT: 9 U/L (ref 0–35)
AST: 17 U/L (ref 0–37)
BILIRUBIN TOTAL: 0.6 mg/dL (ref 0.2–1.2)
BUN: 15 mg/dL (ref 6–23)
CALCIUM: 10.4 mg/dL (ref 8.4–10.5)
CO2: 31 meq/L (ref 19–32)
CREATININE: 0.8 mg/dL (ref 0.40–1.20)
Chloride: 101 mEq/L (ref 96–112)
GFR: 69.85 mL/min (ref >60.00)
Glucose, Bld: 88 mg/dL (ref 70–99)
Potassium: 3.9 mEq/L (ref 3.5–5.1)
Sodium: 137 mEq/L (ref 135–145)
Total Protein: 7.1 g/dL (ref 6.0–8.3)

## 2018-12-05 LAB — CBC WITH DIFFERENTIAL/PLATELET
BASOS ABS: 0 10*3/uL (ref 0.0–0.1)
Basophils Relative: 0.8 % (ref 0.0–3.0)
EOS ABS: 0.1 10*3/uL (ref 0.0–0.7)
Eosinophils Relative: 2 % (ref 0.0–5.0)
HEMATOCRIT: 47.8 % — AB (ref 36.0–46.0)
Hemoglobin: 16.4 g/dL — ABNORMAL HIGH (ref 12.0–15.0)
LYMPHS PCT: 23.3 % (ref 12.0–46.0)
Lymphs Abs: 1.5 10*3/uL (ref 0.7–4.0)
MCHC: 34.3 g/dL (ref 30.0–36.0)
MCV: 91.1 fl (ref 78.0–100.0)
Monocytes Absolute: 0.6 10*3/uL (ref 0.1–1.0)
Monocytes Relative: 9.2 % (ref 3.0–12.0)
NEUTROS ABS: 4.1 10*3/uL (ref 1.4–7.7)
Neutrophils Relative %: 64.7 % (ref 43.0–77.0)
PLATELETS: 156 10*3/uL (ref 150.0–400.0)
RBC: 5.25 Mil/uL — AB (ref 3.87–5.11)
RDW: 12.6 % (ref 11.5–15.5)
WBC: 6.3 10*3/uL (ref 4.0–10.5)

## 2018-12-05 LAB — VITAMIN D 25 HYDROXY (VIT D DEFICIENCY, FRACTURES): VITD: 51.69 ng/mL (ref 30.00–100.00)

## 2018-12-05 LAB — LDL CHOLESTEROL, DIRECT: Direct LDL: 129 mg/dL

## 2018-12-05 NOTE — Progress Notes (Signed)
PCP notes:   Health maintenance:  No gaps identified.  Abnormal screenings:   None  Patient concerns:   None  Nurse concerns:  None  Next PCP appt:   12/07/18 @ 0845

## 2018-12-05 NOTE — Progress Notes (Signed)
Subjective:   Alyssa Nolan is a 78 y.o. female who presents for Medicare Annual (Subsequent) preventive examination.  Review of Systems:  N/A Cardiac Risk Factors include: advanced age (>41mn, >>41women);dyslipidemia;hypertension     Objective:     Vitals: BP 126/78 (BP Location: Right Arm, Patient Position: Sitting, Cuff Size: Normal)   Pulse 62   Temp 97.7 F (36.5 C) (Oral)   Ht 5' 3"  (1.6 m) Comment: no shoes  Wt 167 lb 8 oz (76 kg)   SpO2 97%   BMI 29.67 kg/m   Body mass index is 29.67 kg/m.  Advanced Directives 09/26/2018 09/20/2017 10/25/2016  Does Patient Have a Medical Advance Directive? No No Yes  Type of Advance Directive - -Public librarianLiving will  Copy of [place] Chart? - - No - copy requested  Would patient like information on creating a medical advance directive? No - Patient declined No - Patient declined -    Tobacco Social History   Tobacco Use  Smoking Status Former Smoker  . Types: Cigarettes  . Last attempt to quit: 11/14/1969  . Years since quitting: 49.0  Smokeless Tobacco Never Used     Counseling given: No   Clinical Intake:  Pre-visit preparation completed: Yes  Pain Score: 0-No pain     Nutritional Status: BMI 25 -29 Overweight Nutritional Risks: None Diabetes: No  How often do you need to have someone help you when you read instructions, pamphlets, or other written materials from your doctor or pharmacy?: 1 - Never  Interpreter Needed?: No  Comments: pt lives with spouse Information entered by :: LPinson, LPN  Past Medical History:  Diagnosis Date  . Arthritis 2011   R hip injection per Dr. RNelva Bush . [place]   T1c, N0; ER/PR positive, HER-2/neu negative. INVASIVE DUCTAL CARCINOMA., DCIS  . GERD (gastroesophageal reflux disease)   . History of chickenpox   . Hyperlipidemia   . Hypertension   . Lichen planus   . Osteopenia    Tscore -1, consider  repeat in ~2013, per Dr. EAmalia Haileywith gyn  . Personal history of radiation therapy 2017   MammoSite  . 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 BASAL CELL CARCINOMA EXCISION  01-22-14   chest area. Dr KNehemiah Massed . BREAST BIOPSY Right 2013   CORE - NEG, cyst with ductal hyperplasia without atypia Dr BBary Castilla . BREAST BIOPSY Right 04-28-16   Invasive ductal carcinoma  . BREAST CYST ASPIRATION Bilateral   . BREAST EXCISIONAL BIOPSY Right 1968   NEG  . BREAST LUMPECTOMY Right 2017   f/u mammosite  . BREAST LUMPECTOMY WITH NEEDLE LOCALIZATION Right 06/10/2016   Procedure: BREAST LUMPECTOMY WITH NEEDLE LOCALIZATION;  Surgeon: JRobert Bellow MD;  Location: [facility] ORS;  Service: General;  Laterality: Right;  . BREAST LUMPECTOMY WITH SENTINEL LYMPH NODE BIOPSY Right 06/10/2016   Procedure: BREAST LUMPECTOMY WITH SENTINEL LYMPH NODE BX / WITH RECONSTRUCTION;  Surgeon: JRobert Bellow MD;  Location: [facility] ORS;  Service: General;  Laterality: Right;  . BREAST SURGERY  1968   Fibroadenoma  . CARDIOVASCULAR STRESS TEST  02/07/2002  [place] . CHOLECYST[place]OMY  2005   [place] . COLONOSCOPY  04-22-02   [place] . COLONOSCOPY  09-02-12   Dr BBary Castilla . Endometrial ablation and right  ovary removal  2002  . MRI, hip  01/12/2004  . MRI, neck  06/23/1998  10/31/2003  . OOPHORECTOMY Bilateral 15+ YRS AGO  . Removal of excess eyelids and shortening of eyelid tendons  01/2010  . 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 Right hip injection  03/30/2009  . Touch Up of excess eyelids  04/20/2010   Family History  Problem Relation Age of Onset  . COPD Mother        Smoker  . Heart disease Father        Possible CHF  . Stroke Maternal Grandfather 83       CVA  . Colon cancer Other        mother's 1/2 sister  . Breast cancer Neg Hx   . Diabetes Neg Hx    Social History   Socioeconomic History  .  Marital status: Married    Spouse name: Not on file  . Number of children: 2  . Years of education: Not on file  . Highest education level: Not on file  Occupational History  . Occupation: Retired since 2007 and moved to [place]: [employer]  . Financial resource strain: Not on file  . Food insecurity:    Worry: Not on file    Inability: Not on file  . Transportation needs:    Medical: Not on file    Non-medical: Not on file  Tobacco Use  . Smoking status: Former Smoker    Types: Cigarettes    Last attempt to quit: 11/14/1969    Years since quitting: 49.0  . Smokeless tobacco: Never Used  Substance and Sexual Activity  . Alcohol use: Yes    Comment: Rare  . Drug use: No  . Sexual activity: Not Currently    Birth control/protection: Post-menopausal  Lifestyle  . Physical activity:    Days per week: 3 days    Minutes per session: 60 min  . Stress: Not on file  Relationships  . Social connections:    Talks on phone: Not on file    Gets together: Not on file    Attends religious service: Not on file    Active member of club or organization: Not on file    Attends meetings of clubs or organizations: Not on file    Relationship status: Not on file  Other Topics Concern  . Not on file  Social History Narrative   From [place]:  [employer] grad   2 kids in [employer] and likes to knit    Outpatient Encounter Medications as of 12/05/2018  Medication Sig  . atorvastatin (LIPITOR) 20 MG tablet TAKE 1 TABLET (20 MG TOTAL) BY MOUTH DAILY.  . Calcium Carbonate-Vitamin D (CALCIUM 600/VITAMIN D) 600-400 MG-UNIT per tablet Take 1 tablet by mouth 2 (two) times daily.   . carisoprodol (SOMA) 350 MG tablet TAKE 1 TABLET BY MOUTH EVERYDAY AT BEDTIME  . clobetasol ointment (TEMOVATE) 5.70 % Apply 1 application topically every other day.  . diclofenac sodium (VOLTAREN) 1 % GEL APPLY 2G TOPICALLY FOUR TIMES DAILY AS NEEDED  . letrozole  (FEMARA) 2.5 MG tablet TAKE 1 TABLET (2.5 MG TOTAL) BY MOUTH DAILY.  Marland Kitchen loratadine (CLARITIN) 10 MG tablet Take 10 mg by mouth daily.    . meloxicam (MOBIC) 15 MG tablet Take 15 mg by mouth 2 (two) times daily.   Marland Kitchen omeprazole (PRILOSEC) 20  MG capsule TAKE 1 CAPSULE BY MOUTH EVERY DAY  . polyethylene glycol powder (GLYCOLAX/MIRALAX) powder TAKE 1 CAPFUL(17G) BY MOUTH DAILY  . Propylene Glycol (SYSTANE BALANCE OP) Place 1 drop into both eyes daily.  . traMADol-acetaminophen (ULTRACET) 37.5-325 MG tablet TAKE 2 TABLETS BY MOUTH AT BEDTIME  . traZODone (DESYREL) 100 MG tablet TAKE 1 AND 1/2 TABLETS     (=150MG TOTAL) AT BEDTIME  . triamterene-hydrochlorothiazide (DYAZIDE) 37.5-25 MG capsule TAKE ONE CAPSULE BY MOUTH IN THE MORNING  . vitamin E 100 UNIT capsule Take by mouth daily.   No facility-administered encounter medications on file as of 12/05/2018.     Activities of Daily Living In your present state of health, do you have any difficulty performing the following activities: 12/05/2018  Hearing? Y  Vision? N  Difficulty concentrating or making decisions? N  Walking or climbing stairs? N  Dressing or bathing? N  Doing errands, shopping? N  Preparing Food and eating ? N  Using the Toilet? N  In the past six months, have you accidently leaked urine? N  Do you have problems with loss of bowel control? N  Managing your Medications? N  Managing your Finances? N  Housekeeping or managing your Housekeeping? N  Some recent data might be hidden    Patient Care Team: Tonia Ghent, MD as PCP - General Defrancesco, Alanda Slim, MD as Referring Physician (Obstetrics and Gynecology) Bary Castilla Forest Gleason, MD as Consulting Physician (General Surgery) Ralene Bathe, MD as Consulting Physician (Dermatology) Latanya Maudlin, MD as Consulting Physician (Orthopedic Surgery) Arelia Sneddon, [place] as Consulting Physician (Optometry)    Assessment:   This is a routine wellness examination for  [place].  Exercise Activities and Dietary recommendations Current Exercise Habits: Structured exercise class, Type of exercise: Other - see comments(swimming), Time (Minutes): 60, Frequency (Times/Week): 3, Weekly Exercise (Minutes/Week): 180, Intensity: Moderate, Exercise limited by: None identified  Goals    . Increase physical activity     Starting 12/05/2018, I will continue to exercise for at least 60 minutes 3 days per week.        Fall Risk Fall Risk  12/05/2018 09/20/2017 10/25/2016 10/20/2015 09/12/2013  Falls in the past year? 0 No No Yes No  Number falls in past yr: - - - 1 -    Depression Screen PHQ 2/9 Scores 12/05/2018 09/20/2017 10/25/2016 10/20/2015  PHQ - 2 Score 0 0 0 0  PHQ- 9 Score 0 - - -     Cognitive Function MMSE - Mini Mental State Exam 12/05/2018 10/25/2016  Orientation to time 5 5  Orientation to Place 5 5  Registration 3 3  Attention/ Calculation 0 0  Recall 3 3  Language- name 2 objects 0 0  Language- repeat 1 1  Language- follow 3 step command 3 3  Language- read & follow direction 0 0  Write a sentence 0 0  Copy design 0 0  Total score 20 20     PLEASE NOTE: A Mini-Cog screen was completed. Maximum score is 20. A value of 0 denotes this part of Folstein MMSE was not completed or the patient failed this part of the Mini-Cog screening.   Mini-Cog Screening Orientation to Time - Max 5 pts Orientation to Place - Max 5 pts Registration - Max 3 pts Recall - Max 3 pts Language Repeat - Max 1 pts Language Follow 3 Step Command - Max 3 pts     Immunization History  Administered Date(s) Administered  . Influenza  Split 08/10/2011, 08/10/2012  . Influenza Whole 08/06/2010  . Influenza, High Dose Seasonal PF 08/08/2018  . Influenza,inj,Quad PF,6+ Mos 09/12/2013, 07/30/2014, 08/07/2015, 09/15/2016, 09/08/2017  . Pneumococcal Conjugate-13 09/18/2014  . Pneumococcal Polysaccharide-23 01/25/2011  . Td 07/07/2010  . Zoster 08/09/2010    Screening  Tests Health Maintenance  Topic Date Due  . TETANUS/TDAP  07/07/2020  . COLONOSCOPY  08/15/2022  . INFLUENZA VACCINE  Completed  . DEXA SCAN  Completed  . PNA vac Low Risk Adult  Completed      Plan:     I have personally reviewed, addressed, and noted the following in the patient's chart:  A. Medical and social history B. Use of alcohol, tobacco or illicit drugs  C. Current medications and supplements D. Functional ability and status E.  Nutritional status F.  Physical activity G. Advance directives H. List of other physicians I.  Hospitalizations, surgeries, and ER visits in previous 12 months J.  [place] to include hearing, vision, cognitive, depression L. Referrals and appointments - none  In addition, I have reviewed and discussed with patient certain preventive protocols, quality metrics, and best practice recommendations. A written personalized care plan for preventive services as well as general preventive health recommendations were provided to patient.  See attached scanned questionnaire for additional information.   Signed,   Lindell Noe, MHA, BS, LPN Health Coach

## 2018-12-05 NOTE — Patient Instructions (Signed)
Ms. Hardebeck , Thank you for taking time to come for your Medicare Wellness Visit. I appreciate your ongoing commitment to your health goals. Please review the following plan we discussed and let me know if I can assist you in the future.   These are the goals we discussed: Goals    . Increase physical activity     Starting 12/05/2018, I will continue to exercise for at least 60 minutes 3 days per week.        This is a list of the screening recommended for you and due dates:  Health Maintenance  Topic Date Due  . Tetanus Vaccine  07/07/2020  . Colon Cancer Screening  08/15/2022  . Flu Shot  Completed  . DEXA scan (bone density measurement)  Completed  . Pneumonia vaccines  Completed   Preventive Care for Adults  A healthy lifestyle and preventive care can promote health and wellness. Preventive health guidelines for adults include the following key practices.  . A routine yearly physical is a good way to check with your health care provider about your health and preventive screening. It is a chance to share any concerns and updates on your health and to receive a thorough exam.  . Visit your dentist for a routine exam and preventive care every 6 months. Brush your teeth twice a day and floss once a day. Good oral hygiene prevents tooth decay and gum disease.  . The frequency of eye exams is based on your age, health, family medical history, use  of contact lenses, and other factors. Follow your health care provider's recommendations for frequency of eye exams.  . Eat a healthy diet. Foods like vegetables, fruits, whole grains, low-fat dairy products, and lean protein foods contain the nutrients you need without too many calories. Decrease your intake of foods high in solid fats, added sugars, and salt. Eat the right amount of calories for you. Get information about a proper diet from your health care provider, if necessary.  . Regular physical exercise is one of the most important  things you can do for your health. Most adults should get at least 150 minutes of moderate-intensity exercise (any activity that increases your heart rate and causes you to sweat) each week. In addition, most adults need muscle-strengthening exercises on 2 or more days a week.  Silver Sneakers may be a benefit available to you. To determine eligibility, you may visit the website: www.silversneakers.com or contact program at 651-236-9699 Mon-Fri between 8AM-8PM.   . Maintain a healthy weight. The body mass index (BMI) is a screening tool to identify possible weight problems. It provides an estimate of body fat based on height and weight. Your health care provider can find your BMI and can help you achieve or maintain a healthy weight.   For adults 20 years and older: ? A BMI below 18.5 is considered underweight. ? A BMI of 18.5 to 24.9 is normal. ? A BMI of 25 to 29.9 is considered overweight. ? A BMI of 30 and above is considered obese.   . Maintain normal blood lipids and cholesterol levels by exercising and minimizing your intake of saturated fat. Eat a balanced diet with plenty of fruit and vegetables. Blood tests for lipids and cholesterol should begin at age 41 and be repeated every 5 years. If your lipid or cholesterol levels are high, you are over 50, or you are at high risk for heart disease, you may need your cholesterol levels checked more frequently. Ongoing  high lipid and cholesterol levels should be treated with medicines if diet and exercise are not working.  . If you smoke, find out from your health care provider how to quit. If you do not use tobacco, please do not start.  . If you choose to drink alcohol, please do not consume more than 2 drinks per day. One drink is considered to be 12 ounces (355 mL) of beer, 5 ounces (148 mL) of wine, or 1.5 ounces (44 mL) of liquor.  . If you are 86-38 years old, ask your health care provider if you should take aspirin to prevent  strokes.  . Use sunscreen. Apply sunscreen liberally and repeatedly throughout the day. You should seek shade when your shadow is shorter than you. Protect yourself by wearing long sleeves, pants, a wide-brimmed hat, and sunglasses year round, whenever you are outdoors.  . Once a month, do a whole body skin exam, using a mirror to look at the skin on your back. Tell your health care provider of new moles, moles that have irregular borders, moles that are larger than a pencil eraser, or moles that have changed in shape or color.

## 2018-12-07 ENCOUNTER — Encounter: Payer: Self-pay | Admitting: Family Medicine

## 2018-12-07 ENCOUNTER — Ambulatory Visit (INDEPENDENT_AMBULATORY_CARE_PROVIDER_SITE_OTHER): Payer: Medicare Other | Admitting: Family Medicine

## 2018-12-07 VITALS — BP 116/60 | HR 79 | Temp 97.9°F | Ht 63.0 in | Wt 167.5 lb

## 2018-12-07 DIAGNOSIS — Z170 Estrogen receptor positive status [ER+]: Secondary | ICD-10-CM

## 2018-12-07 DIAGNOSIS — E785 Hyperlipidemia, unspecified: Secondary | ICD-10-CM | POA: Diagnosis not present

## 2018-12-07 DIAGNOSIS — Z0000 Encounter for general adult medical examination without abnormal findings: Secondary | ICD-10-CM | POA: Insufficient documentation

## 2018-12-07 DIAGNOSIS — M1990 Unspecified osteoarthritis, unspecified site: Secondary | ICD-10-CM | POA: Diagnosis not present

## 2018-12-07 DIAGNOSIS — Z7189 Other specified counseling: Secondary | ICD-10-CM

## 2018-12-07 DIAGNOSIS — C50411 Malignant neoplasm of upper-outer quadrant of right female breast: Secondary | ICD-10-CM | POA: Diagnosis not present

## 2018-12-07 DIAGNOSIS — I10 Essential (primary) hypertension: Secondary | ICD-10-CM

## 2018-12-07 DIAGNOSIS — G4700 Insomnia, unspecified: Secondary | ICD-10-CM

## 2018-12-07 NOTE — Assessment & Plan Note (Signed)
Lipids slightly above goal.  Continue stain, continue work on diet and exercise.  Labs d/w pt.  She agrees.

## 2018-12-07 NOTE — Assessment & Plan Note (Signed)
Controlled, continue work on diet and exercise.  Labs d/w pt.  She agrees.  No change in med.  >25 minutes spent in face to face time with patient, >50% spent in counselling or coordination of care.

## 2018-12-07 NOTE — Assessment & Plan Note (Signed)
Advance directive- husband designated if patient were incapacitated.  

## 2018-12-07 NOTE — Assessment & Plan Note (Signed)
Taking trazodone at baseline.  Sleeping well.  No ADE on med.

## 2018-12-07 NOTE — Assessment & Plan Note (Signed)
H/o, on letrazole, with plan for 5 years of tx.  Still with hot flashes, tolerable.  She has seen onc and surgery.  No new sx, no lump, etc.  Mammogram 2019.

## 2018-12-07 NOTE — Patient Instructions (Signed)
Take care.  Glad to see you. Update me as needed.  

## 2018-12-07 NOTE — Progress Notes (Signed)
Has hearing aids.    Elevated Cholesterol: Using medications without problems: yes Muscle aches: she has aches at baseline, but not attributed to statin.   Diet compliance: encouraged.   [facility] [place] was [place] with [place] but is back on now.  D/w pt.    Hypertension:    Using medication without problems or lightheadedness: yes Chest pain with exertion:no Edema:no Short of breath:no Labs d/w pt.    Insomnia.  Taking trazodone at baseline.  Sleeping well.  No ADE on med.    Taking soma daily for muscle pain with relief w/o ADE.  Still using meloxicam daily with rare use of diclofenac gel at baseline.   She had seen chiropractor/gotten massage for neck pain with relief.  Taking tramadol at baseline, no ADE on med.   On letrazole, with plan for 5 years of tx.  Still with hot flashes, tolerable.  She has seen onc and surgery.  No new sx, no lump, etc.  Mammogram 2019.    Flu 2019 Shingrix out of stock   PNA 2015 Tetanus 2011 Colonoscopy 2013 Breast cancer screening- mammogram 2019 DXA 2017, d/w pt.  She'll check with gynecology later this year.   Advance directive- husband designated if patient were incapacitated.   She didn't need refill on meds at this point.  She'll update [place] as needed.    PMH and SH reviewed  Meds, vitals, and allergies reviewed.   ROS: Per HPI unless specifically indicated in ROS section   GEN: nad, alert and oriented HEENT: mucous membranes moist NECK: supple w/o LA CV: rrr. PULM: ctab, no inc wob ABD: soft, +bs EXT: no edema SKIN: no acute rash

## 2018-12-07 NOTE — Assessment & Plan Note (Signed)
Flu 2019 Shingrix out of stock   PNA 2015 Tetanus 2011 Colonoscopy 2013 Breast cancer screening- mammogram 2019 DXA 2017, d/w pt.  She'll check with gynecology later this year.   Advance directive- husband designated if patient were incapacitated.   She didn't need refill on meds at this point.  She'll update [place] as needed.

## 2018-12-07 NOTE — Assessment & Plan Note (Signed)
Would continue current meds at is.  No ADE on meds, with some relief.  Continue massage and chiropractor tx as that was helpful.  She agrees. Cr wnl.

## 2018-12-17 DIAGNOSIS — M9901 Segmental and somatic dysfunction of cervical region: Secondary | ICD-10-CM | POA: Diagnosis not present

## 2018-12-17 DIAGNOSIS — M5033 Other cervical disc degeneration, cervicothoracic region: Secondary | ICD-10-CM | POA: Diagnosis not present

## 2018-12-17 DIAGNOSIS — M9902 Segmental and somatic dysfunction of thoracic region: Secondary | ICD-10-CM | POA: Diagnosis not present

## 2018-12-17 DIAGNOSIS — R51 Headache: Secondary | ICD-10-CM | POA: Diagnosis not present

## 2019-01-03 ENCOUNTER — Other Ambulatory Visit: Payer: Self-pay | Admitting: Family Medicine

## 2019-01-07 DIAGNOSIS — M5033 Other cervical disc degeneration, cervicothoracic region: Secondary | ICD-10-CM | POA: Diagnosis not present

## 2019-01-07 DIAGNOSIS — M9901 Segmental and somatic dysfunction of cervical region: Secondary | ICD-10-CM | POA: Diagnosis not present

## 2019-01-07 DIAGNOSIS — M9902 Segmental and somatic dysfunction of thoracic region: Secondary | ICD-10-CM | POA: Diagnosis not present

## 2019-01-07 DIAGNOSIS — R51 Headache: Secondary | ICD-10-CM | POA: Diagnosis not present

## 2019-01-12 ENCOUNTER — Other Ambulatory Visit: Payer: Self-pay | Admitting: Internal Medicine

## 2019-01-22 ENCOUNTER — Other Ambulatory Visit: Payer: Self-pay | Admitting: Family Medicine

## 2019-01-22 NOTE — Telephone Encounter (Signed)
Electronic refill request. Tramadol Last office visit:   12/07/2018 Last Filled:    180 tablet 1 08/01/2018   Electronic refill request. Carisoprodol Last office visit:   12/07/2018 Last Filled:    90 tablet 1 08/01/2018   Please advise.

## 2019-01-23 NOTE — Telephone Encounter (Signed)
Sent. Thanks.   

## 2019-01-28 DIAGNOSIS — M9901 Segmental and somatic dysfunction of cervical region: Secondary | ICD-10-CM | POA: Diagnosis not present

## 2019-01-28 DIAGNOSIS — R51 Headache: Secondary | ICD-10-CM | POA: Diagnosis not present

## 2019-01-28 DIAGNOSIS — M9902 Segmental and somatic dysfunction of thoracic region: Secondary | ICD-10-CM | POA: Diagnosis not present

## 2019-01-28 DIAGNOSIS — M5033 Other cervical disc degeneration, cervicothoracic region: Secondary | ICD-10-CM | POA: Diagnosis not present

## 2019-01-30 ENCOUNTER — Other Ambulatory Visit: Payer: Self-pay | Admitting: Internal Medicine

## 2019-02-03 NOTE — Progress Notes (Signed)
I reviewed health advisor's note, was available for consultation, and agree with documentation and plan.  

## 2019-02-04 DIAGNOSIS — R51 Headache: Secondary | ICD-10-CM | POA: Diagnosis not present

## 2019-02-04 DIAGNOSIS — M9902 Segmental and somatic dysfunction of thoracic region: Secondary | ICD-10-CM | POA: Diagnosis not present

## 2019-02-04 DIAGNOSIS — M5033 Other cervical disc degeneration, cervicothoracic region: Secondary | ICD-10-CM | POA: Diagnosis not present

## 2019-02-04 DIAGNOSIS — M9901 Segmental and somatic dysfunction of cervical region: Secondary | ICD-10-CM | POA: Diagnosis not present

## 2019-02-11 DIAGNOSIS — M5033 Other cervical disc degeneration, cervicothoracic region: Secondary | ICD-10-CM | POA: Diagnosis not present

## 2019-02-11 DIAGNOSIS — R51 Headache: Secondary | ICD-10-CM | POA: Diagnosis not present

## 2019-02-11 DIAGNOSIS — M9902 Segmental and somatic dysfunction of thoracic region: Secondary | ICD-10-CM | POA: Diagnosis not present

## 2019-02-11 DIAGNOSIS — M9901 Segmental and somatic dysfunction of cervical region: Secondary | ICD-10-CM | POA: Diagnosis not present

## 2019-03-21 DIAGNOSIS — M25551 Pain in right hip: Secondary | ICD-10-CM | POA: Diagnosis not present

## 2019-03-21 DIAGNOSIS — M1611 Unilateral primary osteoarthritis, right hip: Secondary | ICD-10-CM | POA: Diagnosis not present

## 2019-03-26 DIAGNOSIS — M1611 Unilateral primary osteoarthritis, right hip: Secondary | ICD-10-CM | POA: Diagnosis not present

## 2019-03-29 ENCOUNTER — Other Ambulatory Visit: Payer: Self-pay | Admitting: Family Medicine

## 2019-04-09 DIAGNOSIS — M7061 Trochanteric bursitis, right hip: Secondary | ICD-10-CM | POA: Insufficient documentation

## 2019-04-09 DIAGNOSIS — M1611 Unilateral primary osteoarthritis, right hip: Secondary | ICD-10-CM | POA: Diagnosis not present

## 2019-04-11 DIAGNOSIS — R262 Difficulty in walking, not elsewhere classified: Secondary | ICD-10-CM | POA: Diagnosis not present

## 2019-04-11 DIAGNOSIS — M25551 Pain in right hip: Secondary | ICD-10-CM | POA: Diagnosis not present

## 2019-04-11 DIAGNOSIS — M7061 Trochanteric bursitis, right hip: Secondary | ICD-10-CM | POA: Diagnosis not present

## 2019-04-16 DIAGNOSIS — M7061 Trochanteric bursitis, right hip: Secondary | ICD-10-CM | POA: Diagnosis not present

## 2019-04-16 DIAGNOSIS — R262 Difficulty in walking, not elsewhere classified: Secondary | ICD-10-CM | POA: Diagnosis not present

## 2019-04-16 DIAGNOSIS — M25551 Pain in right hip: Secondary | ICD-10-CM | POA: Diagnosis not present

## 2019-04-19 DIAGNOSIS — R262 Difficulty in walking, not elsewhere classified: Secondary | ICD-10-CM | POA: Diagnosis not present

## 2019-04-19 DIAGNOSIS — M7061 Trochanteric bursitis, right hip: Secondary | ICD-10-CM | POA: Diagnosis not present

## 2019-04-19 DIAGNOSIS — M25551 Pain in right hip: Secondary | ICD-10-CM | POA: Diagnosis not present

## 2019-04-23 DIAGNOSIS — M25551 Pain in right hip: Secondary | ICD-10-CM | POA: Diagnosis not present

## 2019-04-23 DIAGNOSIS — R262 Difficulty in walking, not elsewhere classified: Secondary | ICD-10-CM | POA: Diagnosis not present

## 2019-04-23 DIAGNOSIS — M7061 Trochanteric bursitis, right hip: Secondary | ICD-10-CM | POA: Diagnosis not present

## 2019-04-26 DIAGNOSIS — R262 Difficulty in walking, not elsewhere classified: Secondary | ICD-10-CM | POA: Diagnosis not present

## 2019-04-26 DIAGNOSIS — M7061 Trochanteric bursitis, right hip: Secondary | ICD-10-CM | POA: Diagnosis not present

## 2019-04-26 DIAGNOSIS — M25551 Pain in right hip: Secondary | ICD-10-CM | POA: Diagnosis not present

## 2019-04-29 ENCOUNTER — Other Ambulatory Visit: Payer: Self-pay | Admitting: Family Medicine

## 2019-04-29 ENCOUNTER — Other Ambulatory Visit: Payer: Self-pay | Admitting: Internal Medicine

## 2019-04-30 ENCOUNTER — Telehealth: Payer: Self-pay | Admitting: Internal Medicine

## 2019-04-30 DIAGNOSIS — R262 Difficulty in walking, not elsewhere classified: Secondary | ICD-10-CM | POA: Diagnosis not present

## 2019-04-30 DIAGNOSIS — M7061 Trochanteric bursitis, right hip: Secondary | ICD-10-CM | POA: Diagnosis not present

## 2019-04-30 DIAGNOSIS — M25551 Pain in right hip: Secondary | ICD-10-CM | POA: Diagnosis not present

## 2019-04-30 MED ORDER — OMEPRAZOLE 20 MG PO CPDR: DELAYED_RELEASE_CAPSULE | ORAL | 0 refills | Status: DC

## 2019-04-30 NOTE — Telephone Encounter (Signed)
30 day rx sent until office visit.

## 2019-04-30 NOTE — Telephone Encounter (Signed)
Patient called in wanting to get refills. Patient is schedule for 07/28 for virtual visit

## 2019-05-03 DIAGNOSIS — R262 Difficulty in walking, not elsewhere classified: Secondary | ICD-10-CM | POA: Diagnosis not present

## 2019-05-03 DIAGNOSIS — M25551 Pain in right hip: Secondary | ICD-10-CM | POA: Diagnosis not present

## 2019-05-03 DIAGNOSIS — M7061 Trochanteric bursitis, right hip: Secondary | ICD-10-CM | POA: Diagnosis not present

## 2019-05-07 DIAGNOSIS — M7061 Trochanteric bursitis, right hip: Secondary | ICD-10-CM | POA: Diagnosis not present

## 2019-05-07 DIAGNOSIS — R262 Difficulty in walking, not elsewhere classified: Secondary | ICD-10-CM | POA: Diagnosis not present

## 2019-05-07 DIAGNOSIS — M25551 Pain in right hip: Secondary | ICD-10-CM | POA: Diagnosis not present

## 2019-05-10 DIAGNOSIS — M7061 Trochanteric bursitis, right hip: Secondary | ICD-10-CM | POA: Diagnosis not present

## 2019-05-10 DIAGNOSIS — M25551 Pain in right hip: Secondary | ICD-10-CM | POA: Diagnosis not present

## 2019-05-10 DIAGNOSIS — R262 Difficulty in walking, not elsewhere classified: Secondary | ICD-10-CM | POA: Diagnosis not present

## 2019-05-13 ENCOUNTER — Other Ambulatory Visit: Payer: Self-pay | Admitting: *Deleted

## 2019-05-13 DIAGNOSIS — C50411 Malignant neoplasm of upper-outer quadrant of right female breast: Secondary | ICD-10-CM

## 2019-05-14 DIAGNOSIS — R262 Difficulty in walking, not elsewhere classified: Secondary | ICD-10-CM | POA: Diagnosis not present

## 2019-05-14 DIAGNOSIS — M25551 Pain in right hip: Secondary | ICD-10-CM | POA: Diagnosis not present

## 2019-05-14 DIAGNOSIS — M7061 Trochanteric bursitis, right hip: Secondary | ICD-10-CM | POA: Diagnosis not present

## 2019-05-20 DIAGNOSIS — M25551 Pain in right hip: Secondary | ICD-10-CM | POA: Diagnosis not present

## 2019-05-20 DIAGNOSIS — M7061 Trochanteric bursitis, right hip: Secondary | ICD-10-CM | POA: Diagnosis not present

## 2019-05-20 DIAGNOSIS — R262 Difficulty in walking, not elsewhere classified: Secondary | ICD-10-CM | POA: Diagnosis not present

## 2019-05-21 DIAGNOSIS — Z170 Estrogen receptor positive status [ER+]: Secondary | ICD-10-CM | POA: Diagnosis not present

## 2019-05-21 DIAGNOSIS — Z01419 Encounter for gynecological examination (general) (routine) without abnormal findings: Secondary | ICD-10-CM | POA: Diagnosis not present

## 2019-05-21 DIAGNOSIS — C50411 Malignant neoplasm of upper-outer quadrant of right female breast: Secondary | ICD-10-CM | POA: Diagnosis not present

## 2019-05-22 ENCOUNTER — Other Ambulatory Visit: Payer: Self-pay | Admitting: Family Medicine

## 2019-05-23 ENCOUNTER — Other Ambulatory Visit: Payer: Self-pay | Admitting: Internal Medicine

## 2019-05-24 DIAGNOSIS — M25551 Pain in right hip: Secondary | ICD-10-CM | POA: Diagnosis not present

## 2019-05-24 DIAGNOSIS — R262 Difficulty in walking, not elsewhere classified: Secondary | ICD-10-CM | POA: Diagnosis not present

## 2019-05-24 DIAGNOSIS — M7061 Trochanteric bursitis, right hip: Secondary | ICD-10-CM | POA: Diagnosis not present

## 2019-05-28 DIAGNOSIS — M7061 Trochanteric bursitis, right hip: Secondary | ICD-10-CM | POA: Diagnosis not present

## 2019-05-28 DIAGNOSIS — R262 Difficulty in walking, not elsewhere classified: Secondary | ICD-10-CM | POA: Diagnosis not present

## 2019-05-28 DIAGNOSIS — M25551 Pain in right hip: Secondary | ICD-10-CM | POA: Diagnosis not present

## 2019-05-31 DIAGNOSIS — M25551 Pain in right hip: Secondary | ICD-10-CM | POA: Diagnosis not present

## 2019-05-31 DIAGNOSIS — M7061 Trochanteric bursitis, right hip: Secondary | ICD-10-CM | POA: Diagnosis not present

## 2019-05-31 DIAGNOSIS — R262 Difficulty in walking, not elsewhere classified: Secondary | ICD-10-CM | POA: Diagnosis not present

## 2019-06-03 ENCOUNTER — Encounter: Payer: Self-pay | Admitting: General Surgery

## 2019-06-04 DIAGNOSIS — M7061 Trochanteric bursitis, right hip: Secondary | ICD-10-CM | POA: Diagnosis not present

## 2019-06-04 DIAGNOSIS — M25551 Pain in right hip: Secondary | ICD-10-CM | POA: Diagnosis not present

## 2019-06-04 DIAGNOSIS — R262 Difficulty in walking, not elsewhere classified: Secondary | ICD-10-CM | POA: Diagnosis not present

## 2019-06-07 DIAGNOSIS — M7061 Trochanteric bursitis, right hip: Secondary | ICD-10-CM | POA: Diagnosis not present

## 2019-06-07 DIAGNOSIS — R262 Difficulty in walking, not elsewhere classified: Secondary | ICD-10-CM | POA: Diagnosis not present

## 2019-06-07 DIAGNOSIS — M25551 Pain in right hip: Secondary | ICD-10-CM | POA: Diagnosis not present

## 2019-06-10 ENCOUNTER — Encounter: Payer: Self-pay | Admitting: *Deleted

## 2019-06-11 ENCOUNTER — Ambulatory Visit (INDEPENDENT_AMBULATORY_CARE_PROVIDER_SITE_OTHER): Payer: Medicare Other | Admitting: Internal Medicine

## 2019-06-11 ENCOUNTER — Encounter: Payer: Self-pay | Admitting: Internal Medicine

## 2019-06-11 VITALS — Ht 64.0 in | Wt 164.0 lb

## 2019-06-11 DIAGNOSIS — M25551 Pain in right hip: Secondary | ICD-10-CM | POA: Diagnosis not present

## 2019-06-11 DIAGNOSIS — R262 Difficulty in walking, not elsewhere classified: Secondary | ICD-10-CM | POA: Diagnosis not present

## 2019-06-11 DIAGNOSIS — K5909 Other constipation: Secondary | ICD-10-CM | POA: Diagnosis not present

## 2019-06-11 DIAGNOSIS — M7061 Trochanteric bursitis, right hip: Secondary | ICD-10-CM | POA: Diagnosis not present

## 2019-06-11 DIAGNOSIS — K219 Gastro-esophageal reflux disease without esophagitis: Secondary | ICD-10-CM | POA: Diagnosis not present

## 2019-06-11 MED ORDER — OMEPRAZOLE 20 MG PO CPDR: 20.0000 mg | DELAYED_RELEASE_CAPSULE | Freq: Every day | ORAL | 3 refills | Status: DC

## 2019-06-11 NOTE — Patient Instructions (Addendum)
We have sent the following medications to your pharmacy for you to pick up at your convenience: Omeprazole 20 mg daily  Continue MiraLAX 17 g daily.  You will be due for a recall colonoscopy in 08/2022. We will send you a reminder in the mail when it gets closer to that time.  Please follow-up with Dr Hilarie Fredrickson in 18 months; sooner if needed.  If you are age 76 or older, your body mass index should be between 23-30. Your Body mass index is 28.15 kg/m. If this is out of the aforementioned range listed, please consider follow up with your Primary Care Provider.  If you are age 25 or younger, your body mass index should be between 19-25. Your Body mass index is 28.15 kg/m. If this is out of the aformentioned range listed, please consider follow up with your Primary Care Provider.

## 2019-06-11 NOTE — Addendum Note (Signed)
Addended by: Larina Bras on: 06/11/2019 10:22 AM   Modules accepted: Orders

## 2019-06-11 NOTE — Progress Notes (Signed)
Subjective:    Patient ID: Alyssa Nolan, female    DOB: [birth year], 79 y.o.   MRN: [id number]  This service was provided via telemedicine.   The patient was located at home The provider was located in provider's GI office. The patient did consent to this telephone visit and is aware of possible charges through their insurance for this visit.   The persons participating in this telemedicine service were the patient and I. Time spent on call: 9 min   HPI [place] is a 78 year old female with a history of GERD and chronic constipation seen for follow-up.  Last seen in December 2018.  She is seen by telephone visit today in the setting of COVID-19 pandemic.  She reports that she has been doing well.  She remains on omeprazole 20 mg daily.  With this her reflux is well controlled.  No dysphagia or odynophagia.  No abdominal pain.  No early satiety.  No nausea or vomiting.  She also continues MiraLAX 17 g daily.  Usually she uses the generic version from [facility].  Bowels remain regular as long as she remains on this medicine.  No change in her bowel habits, blood in her stool or melena.  She has having issues with hip pain which she has been told is bursitis.  She feels that this is related to lack of activity during the COVID-19 pandemic.  Previously she was faithful with water aerobics at the [facility] in [place].  She has been having physical therapy and will be seeing orthopedics soon for a possible injection for bursitis.  She is able to do some water aerobics at 1 of her friend's home who has a pool in her backyard.  Her last colonoscopy was 2013.  Review of Systems As per HPI, otherwise negative  Current Medications, Allergies, Past Medical History, Past Surgical History, Family History and Social History were reviewed in [employer] record.     Objective:   Physical Exam No PE, telephone visit  CBC    Component Value Date/Time   WBC 6.3  12/05/2018 1257   RBC 5.25 (H) 12/05/2018 1257   HGB 16.4 (H) 12/05/2018 1257   HCT 47.8 (H) 12/05/2018 1257   PLT 156.0 12/05/2018 1257   MCV 91.1 12/05/2018 1257   MCHC 34.3 12/05/2018 1257   RDW 12.6 12/05/2018 1257   LYMPHSABS 1.5 12/05/2018 1257   MONOABS 0.6 12/05/2018 1257   EOSABS 0.1 12/05/2018 1257   BASOSABS 0.0 12/05/2018 1257   CMP     Component Value Date/Time   NA 137 12/05/2018 1257   K 3.9 12/05/2018 1257   CL 101 12/05/2018 1257   CO2 31 12/05/2018 1257   GLUCOSE 88 12/05/2018 1257   BUN 15 12/05/2018 1257   CREATININE 0.80 12/05/2018 1257   CALCIUM 10.4 12/05/2018 1257   PROT 7.1 12/05/2018 1257   ALBUMIN 4.2 12/05/2018 1257   AST 17 12/05/2018 1257   ALT 9 12/05/2018 1257   ALKPHOS 68 12/05/2018 1257   BILITOT 0.6 12/05/2018 1257   GFRNONAA 90.18 08/06/2010 0912         Assessment & Plan:  78 year old female with a history of GERD and chronic constipation seen for follow-up  1.  GERD --symptoms well controlled.  No alarm symptoms.  We will continue omeprazole 20 mg daily. --Omeprazole 20 mg daily; 90-day supply  2.  Chronic constipation --Merril Abbe is working well.  No alarm symptoms. --Continue MiraLAX 17 g daily  3.  CRC screening --surveillance colonoscopy would be October 2023.  She will be 81 at that point.  We can discuss the risks versus benefits of repeat screening at that time.  She can follow-up in around 18 months, sooner if needed

## 2019-06-14 DIAGNOSIS — R262 Difficulty in walking, not elsewhere classified: Secondary | ICD-10-CM | POA: Diagnosis not present

## 2019-06-14 DIAGNOSIS — M7061 Trochanteric bursitis, right hip: Secondary | ICD-10-CM | POA: Diagnosis not present

## 2019-06-14 DIAGNOSIS — M25551 Pain in right hip: Secondary | ICD-10-CM | POA: Diagnosis not present

## 2019-06-17 DIAGNOSIS — M1611 Unilateral primary osteoarthritis, right hip: Secondary | ICD-10-CM | POA: Diagnosis not present

## 2019-06-17 DIAGNOSIS — M7061 Trochanteric bursitis, right hip: Secondary | ICD-10-CM | POA: Diagnosis not present

## 2019-06-28 ENCOUNTER — Ambulatory Visit
Admission: RE | Admit: 2019-06-28 | Discharge: 2019-06-28 | Disposition: A | Discharge status: Home / Self Care | Payer: Medicare Other | Source: Ambulatory Visit | Attending: General Surgery | Admitting: General Surgery

## 2019-06-28 DIAGNOSIS — R928 Other abnormal and inconclusive findings on diagnostic imaging of breast: Secondary | ICD-10-CM | POA: Diagnosis not present

## 2019-06-28 DIAGNOSIS — C50411 Malignant neoplasm of upper-outer quadrant of right female breast: Secondary | ICD-10-CM

## 2019-06-28 DIAGNOSIS — Z170 Estrogen receptor positive status [ER+]: Secondary | ICD-10-CM | POA: Diagnosis not present

## 2019-07-04 ENCOUNTER — Ambulatory Visit: Payer: Medicare Other | Admitting: General Surgery

## 2019-07-05 ENCOUNTER — Ambulatory Visit: Payer: Medicare Other | Admitting: Surgery

## 2019-07-24 ENCOUNTER — Other Ambulatory Visit: Payer: Self-pay | Admitting: Family Medicine

## 2019-07-26 ENCOUNTER — Ambulatory Visit: Payer: Medicare Other | Admitting: Surgery

## 2019-07-26 ENCOUNTER — Encounter: Payer: Self-pay | Admitting: Family Medicine

## 2019-07-26 NOTE — Telephone Encounter (Signed)
Sent. Thanks.   

## 2019-07-26 NOTE — Telephone Encounter (Signed)
Electronic refill request. Tramadol Last office visit:   12/07/2018 Last Filled:    180 tablet 1 01/23/2019    Electronic refill request. Carisoprodol Last office visit:   12/07/2018 Last Filled:    90 tablet 1 01/23/2019   Please advise.

## 2019-08-05 ENCOUNTER — Encounter: Payer: Self-pay | Admitting: Family Medicine

## 2019-08-06 ENCOUNTER — Ambulatory Visit: Payer: Medicare Other

## 2019-08-21 ENCOUNTER — Ambulatory Visit: Payer: Medicare Other

## 2019-08-30 ENCOUNTER — Other Ambulatory Visit: Payer: Self-pay

## 2019-08-30 ENCOUNTER — Encounter: Payer: Self-pay | Admitting: Family Medicine

## 2019-08-30 ENCOUNTER — Ambulatory Visit (INDEPENDENT_AMBULATORY_CARE_PROVIDER_SITE_OTHER): Payer: Medicare Other | Admitting: Family Medicine

## 2019-08-30 VITALS — BP 114/68 | HR 78 | Temp 97.7°F | Wt 165.4 lb

## 2019-08-30 DIAGNOSIS — R3915 Urgency of urination: Secondary | ICD-10-CM

## 2019-08-30 LAB — POCT URINALYSIS DIPSTICK
Bilirubin, UA: NEGATIVE
Blood, UA: NEGATIVE
Glucose, UA: NEGATIVE
Ketones, UA: NEGATIVE
Nitrite, UA: NEGATIVE
Protein, UA: NEGATIVE
Spec Grav, UA: 1.015 (ref 1.010–1.025)
Urobilinogen, UA: 0.2 E.U./dL
pH, UA: 7.5 (ref 5.0–8.0)

## 2019-08-30 MED ORDER — SULFAMETHOXAZOLE-TRIMETHOPRIM 400-80 MG PO TABS: 1.0000 | ORAL_TABLET | Freq: Two times a day (BID) | ORAL | 0 refills | Status: DC

## 2019-08-30 NOTE — Progress Notes (Signed)
Dysuria: no burning but incomplete voiding, need to double void, likely with hematuria this AM duration of symptoms: 3 days  abdominal pain: no fevers:no back pain:no vomiting:no U/a d/w pt.   Her husband had hernia surgery.    Meds, vitals, and allergies reviewed.  Per HPI unless specifically indicated in ROS section   GEN: nad, alert and oriented HEENT: ncats NECK: supple CV: rrr.  PULM: ctab, no inc wob ABD: soft, +bs, suprapubic area not tender EXT: no edema SKIN: no acute rash BACK: no CVA pain

## 2019-08-30 NOTE — Patient Instructions (Signed)
Drink plenty of water and start the antibiotics today.  We'll contact you with your lab report.  Take care.   

## 2019-09-01 DIAGNOSIS — R3915 Urgency of urination: Secondary | ICD-10-CM | POA: Insufficient documentation

## 2019-09-01 LAB — URINE CULTURE
MICRO NUMBER:: 998518
SPECIMEN QUALITY:: ADEQUATE

## 2019-09-01 NOTE — Assessment & Plan Note (Signed)
Likely cystitis.  Okay for outpatient follow-up.  Urine culture pending.  Urinalysis discussed with patient.  Start Septra.  Update me as needed.  See after visit summary.

## 2019-09-04 ENCOUNTER — Ambulatory Visit (INDEPENDENT_AMBULATORY_CARE_PROVIDER_SITE_OTHER): Payer: Medicare Other

## 2019-09-04 DIAGNOSIS — Z23 Encounter for immunization: Secondary | ICD-10-CM | POA: Diagnosis not present

## 2019-09-04 NOTE — Progress Notes (Signed)
Patient received Shingrix #1 today. Patient tolerated injection well. Patient was advised to come back for Shingrix #2 after 11/04/2019 but before 6 months from today.  Patient signed ABN Waiver form for the vaccine

## 2019-09-05 DIAGNOSIS — C50411 Malignant neoplasm of upper-outer quadrant of right female breast: Secondary | ICD-10-CM | POA: Diagnosis not present

## 2019-09-09 ENCOUNTER — Other Ambulatory Visit: Payer: Self-pay | Admitting: General Surgery

## 2019-09-09 DIAGNOSIS — C50411 Malignant neoplasm of upper-outer quadrant of right female breast: Secondary | ICD-10-CM

## 2019-09-09 DIAGNOSIS — M8580 Other specified disorders of bone density and structure, unspecified site: Secondary | ICD-10-CM

## 2019-09-09 DIAGNOSIS — Z79811 Long term (current) use of aromatase inhibitors: Secondary | ICD-10-CM

## 2019-09-09 DIAGNOSIS — Z170 Estrogen receptor positive status [ER+]: Secondary | ICD-10-CM

## 2019-09-16 ENCOUNTER — Other Ambulatory Visit: Payer: Self-pay | Admitting: Family Medicine

## 2019-09-16 ENCOUNTER — Encounter: Payer: Self-pay | Admitting: Family Medicine

## 2019-09-16 MED ORDER — NITROFURANTOIN MONOHYD MACRO 100 MG PO CAPS: 100.0000 mg | ORAL_CAPSULE | Freq: Two times a day (BID) | ORAL | 0 refills | Status: DC

## 2019-09-25 ENCOUNTER — Other Ambulatory Visit: Payer: Self-pay

## 2019-09-25 ENCOUNTER — Ambulatory Visit
Admission: RE | Admit: 2019-09-25 | Discharge: 2019-09-25 | Disposition: A | Discharge status: Home / Self Care | Payer: Medicare Other | Source: Ambulatory Visit | Attending: Radiation Oncology | Admitting: Radiation Oncology

## 2019-09-25 ENCOUNTER — Encounter: Payer: Self-pay | Admitting: Radiation Oncology

## 2019-09-25 VITALS — BP 129/72 | HR 75 | Resp 16 | Wt 165.4 lb

## 2019-09-25 DIAGNOSIS — D0511 Intraductal carcinoma in situ of right breast: Secondary | ICD-10-CM | POA: Diagnosis not present

## 2019-09-25 DIAGNOSIS — C50411 Malignant neoplasm of upper-outer quadrant of right female breast: Secondary | ICD-10-CM

## 2019-09-25 DIAGNOSIS — Z79811 Long term (current) use of aromatase inhibitors: Secondary | ICD-10-CM | POA: Insufficient documentation

## 2019-09-25 DIAGNOSIS — Z170 Estrogen receptor positive status [ER+]: Secondary | ICD-10-CM | POA: Insufficient documentation

## 2019-09-25 DIAGNOSIS — Z923 Personal history of irradiation: Secondary | ICD-10-CM | POA: Diagnosis not present

## 2019-09-25 NOTE — Progress Notes (Signed)
Radiation Oncology Follow up Note  Name: Alyssa Nolan   Date:   09/25/2019 MRN:  [id number] DOB: 04-30-1941    This 78 y.o. female presents to the clinic today for 3-year follow-up status post accelerated partial breast radiation to her right breast for ER/PR positive ductal carcinoma in situ.  REFERRING PROVIDER: Tonia Ghent, MD  HPI: Patient is a 78 year old female now out 3 years having completed accelerated partial breast radiation to her right breast for ER/PR positive ductal carcinoma in situ.  Seen today in routine follow-up she is doing well.  She has problems with arthritis and back pain unrelated to her breast cancer.  She specifically denies breast tenderness cough or bone pain..  Mammogram back in August showed some scattered areas of fibroglandular density with bilateral diagnostic mammograms recommended in 1 year.  There was no evidence of malignancy.  She is currently on Femara without side effect.  COMPLICATIONS OF TREATMENT: none  FOLLOW UP COMPLIANCE: keeps appointments   PHYSICAL EXAM:  BP 129/72 (BP Location: Left Arm, Patient Position: Sitting)   Pulse 75   Resp 16   Wt 165 lb 6.4 oz (75 kg)   BMI 28.39 kg/m  Lungs are clear to A&P cardiac examination essentially unremarkable with regular rate and rhythm. No dominant mass or nodularity is noted in either breast in 2 positions examined. Incision is well-healed. No axillary or supraclavicular adenopathy is appreciated. Cosmetic result is excellent.  Well-developed well-nourished patient in NAD. HEENT reveals PERLA, EOMI, discs not visualized.  Oral cavity is clear. No oral mucosal lesions are identified. Neck is clear without evidence of cervical or supraclavicular adenopathy. Lungs are clear to A&P. Cardiac examination is essentially unremarkable with regular rate and rhythm without murmur rub or thrill. Abdomen is benign with no organomegaly or masses noted. Motor sensory and DTR levels are equal and  symmetric in the upper and lower extremities. Cranial nerves II through XII are grossly intact. Proprioception is intact. No peripheral adenopathy or edema is identified. No motor or sensory levels are noted. Crude visual fields are within normal range.  RADIOLOGY RESULTS: Mammograms are reviewed and compatible with above-stated findings  PLAN: Present time she is now 3 years out with no evidence of disease.  She continues to do well.  I am pleased with her overall progress.  I have asked to see her back in 1 year for follow-up.  Patient knows to call with any concerns.  She continues on Femara without side effect.  I would like to take this opportunity to thank you for allowing me to participate in the care of your patient.Noreene Filbert, MD

## 2019-10-02 ENCOUNTER — Ambulatory Visit
Admission: RE | Admit: 2019-10-02 | Discharge: 2019-10-02 | Disposition: A | Discharge status: Home / Self Care | Payer: Medicare Other | Source: Ambulatory Visit | Attending: General Surgery | Admitting: General Surgery

## 2019-10-02 DIAGNOSIS — Z780 Asymptomatic menopausal state: Secondary | ICD-10-CM | POA: Diagnosis not present

## 2019-10-02 DIAGNOSIS — Z79811 Long term (current) use of aromatase inhibitors: Secondary | ICD-10-CM | POA: Diagnosis not present

## 2019-10-02 DIAGNOSIS — Z170 Estrogen receptor positive status [ER+]: Secondary | ICD-10-CM | POA: Insufficient documentation

## 2019-10-02 DIAGNOSIS — M8589 Other specified disorders of bone density and structure, multiple sites: Secondary | ICD-10-CM | POA: Diagnosis not present

## 2019-10-02 DIAGNOSIS — C50411 Malignant neoplasm of upper-outer quadrant of right female breast: Secondary | ICD-10-CM | POA: Diagnosis not present

## 2019-10-02 DIAGNOSIS — M8580 Other specified disorders of bone density and structure, unspecified site: Secondary | ICD-10-CM | POA: Diagnosis not present

## 2019-10-25 DIAGNOSIS — H903 Sensorineural hearing loss, bilateral: Secondary | ICD-10-CM | POA: Diagnosis not present

## 2019-10-28 DIAGNOSIS — D225 Melanocytic nevi of trunk: Secondary | ICD-10-CM | POA: Diagnosis not present

## 2019-10-28 DIAGNOSIS — C44311 Basal cell carcinoma of skin of nose: Secondary | ICD-10-CM | POA: Diagnosis not present

## 2019-10-28 DIAGNOSIS — D229 Melanocytic nevi, unspecified: Secondary | ICD-10-CM | POA: Diagnosis not present

## 2019-10-28 DIAGNOSIS — D2230 Melanocytic nevi of unspecified part of face: Secondary | ICD-10-CM | POA: Diagnosis not present

## 2019-10-28 DIAGNOSIS — L821 Other seborrheic keratosis: Secondary | ICD-10-CM | POA: Diagnosis not present

## 2019-10-28 DIAGNOSIS — Z85828 Personal history of other malignant neoplasm of skin: Secondary | ICD-10-CM | POA: Diagnosis not present

## 2019-10-28 DIAGNOSIS — L820 Inflamed seborrheic keratosis: Secondary | ICD-10-CM | POA: Diagnosis not present

## 2019-10-28 DIAGNOSIS — Z1283 Encounter for screening for malignant neoplasm of skin: Secondary | ICD-10-CM | POA: Diagnosis not present

## 2019-10-28 DIAGNOSIS — L814 Other melanin hyperpigmentation: Secondary | ICD-10-CM | POA: Diagnosis not present

## 2019-10-28 DIAGNOSIS — D692 Other nonthrombocytopenic purpura: Secondary | ICD-10-CM | POA: Diagnosis not present

## 2019-10-28 DIAGNOSIS — L578 Other skin changes due to chronic exposure to nonionizing radiation: Secondary | ICD-10-CM | POA: Diagnosis not present

## 2019-10-28 DIAGNOSIS — L570 Actinic keratosis: Secondary | ICD-10-CM | POA: Diagnosis not present

## 2019-10-28 HISTORY — PX: SKIN SURGERY: SHX2413

## 2019-10-29 ENCOUNTER — Other Ambulatory Visit: Payer: Self-pay | Admitting: Family Medicine

## 2019-10-29 NOTE — Telephone Encounter (Signed)
Sent. Thanks.   

## 2019-10-29 NOTE — Telephone Encounter (Signed)
Electronic refill request. Trazodone Last office visit:   08/30/2019 Acute Last Filled:    135 tablet 1 05/22/2019  Please advise.

## 2019-11-22 ENCOUNTER — Encounter: Payer: Self-pay | Admitting: Family Medicine

## 2019-12-01 ENCOUNTER — Other Ambulatory Visit: Payer: Self-pay | Admitting: Family Medicine

## 2019-12-01 DIAGNOSIS — E559 Vitamin D deficiency, unspecified: Secondary | ICD-10-CM

## 2019-12-01 DIAGNOSIS — M8580 Other specified disorders of bone density and structure, unspecified site: Secondary | ICD-10-CM

## 2019-12-01 DIAGNOSIS — E785 Hyperlipidemia, unspecified: Secondary | ICD-10-CM

## 2019-12-04 ENCOUNTER — Encounter: Payer: Self-pay | Admitting: Family Medicine

## 2019-12-09 ENCOUNTER — Other Ambulatory Visit (INDEPENDENT_AMBULATORY_CARE_PROVIDER_SITE_OTHER): Payer: Medicare Other

## 2019-12-09 ENCOUNTER — Ambulatory Visit: Payer: Medicare Other

## 2019-12-09 ENCOUNTER — Other Ambulatory Visit: Payer: Self-pay

## 2019-12-09 ENCOUNTER — Ambulatory Visit (INDEPENDENT_AMBULATORY_CARE_PROVIDER_SITE_OTHER): Payer: Medicare Other

## 2019-12-09 DIAGNOSIS — M8580 Other specified disorders of bone density and structure, unspecified site: Secondary | ICD-10-CM | POA: Diagnosis not present

## 2019-12-09 DIAGNOSIS — Z0000 Encounter for general adult medical examination without abnormal findings: Secondary | ICD-10-CM

## 2019-12-09 DIAGNOSIS — E785 Hyperlipidemia, unspecified: Secondary | ICD-10-CM

## 2019-12-09 DIAGNOSIS — E559 Vitamin D deficiency, unspecified: Secondary | ICD-10-CM | POA: Diagnosis not present

## 2019-12-09 LAB — COMPREHENSIVE METABOLIC PANEL
ALT: 12 U/L (ref 0–35)
AST: 18 U/L (ref 0–37)
Albumin: 4.2 g/dL (ref 3.5–5.2)
Alkaline Phosphatase: 73 U/L (ref 39–117)
BUN: 17 mg/dL (ref 6–23)
CO2: 32 mEq/L (ref 19–32)
Calcium: 10.7 mg/dL — ABNORMAL HIGH (ref 8.4–10.5)
Chloride: 99 mEq/L (ref 96–112)
Creatinine, Ser: 0.82 mg/dL (ref 0.40–1.20)
GFR: 67.7 mL/min (ref >60.00)
Glucose, Bld: 87 mg/dL (ref 70–99)
Potassium: 3.8 mEq/L (ref 3.5–5.1)
Sodium: 138 mEq/L (ref 135–145)
Total Bilirubin: 0.8 mg/dL (ref 0.2–1.2)
Total Protein: 6.8 g/dL (ref 6.0–8.3)

## 2019-12-09 LAB — CBC WITH DIFFERENTIAL/PLATELET
Basophils Absolute: 0.1 10*3/uL (ref 0.0–0.1)
Basophils Relative: 1.1 % (ref 0.0–3.0)
Eosinophils Absolute: 0.2 10*3/uL (ref 0.0–0.7)
Eosinophils Relative: 3.4 % (ref 0.0–5.0)
HCT: 48.8 % — ABNORMAL HIGH (ref 36.0–46.0)
Hemoglobin: 16.6 g/dL — ABNORMAL HIGH (ref 12.0–15.0)
Lymphocytes Relative: 27.3 % (ref 12.0–46.0)
Lymphs Abs: 1.6 10*3/uL (ref 0.7–4.0)
MCHC: 34 g/dL (ref 30.0–36.0)
MCV: 91.8 fl (ref 78.0–100.0)
Monocytes Absolute: 0.7 10*3/uL (ref 0.1–1.0)
Monocytes Relative: 11.9 % (ref 3.0–12.0)
Neutro Abs: 3.3 10*3/uL (ref 1.4–7.7)
Neutrophils Relative %: 56.3 % (ref 43.0–77.0)
Platelets: 154 10*3/uL (ref 150.0–400.0)
RBC: 5.32 Mil/uL — ABNORMAL HIGH (ref 3.87–5.11)
RDW: 12.7 % (ref 11.5–15.5)
WBC: 5.9 10*3/uL (ref 4.0–10.5)

## 2019-12-09 LAB — LDL CHOLESTEROL, DIRECT: Direct LDL: 138 mg/dL

## 2019-12-09 LAB — LIPID PANEL
Cholesterol: 215 mg/dL — ABNORMAL HIGH (ref 0–200)
HDL: 42.6 mg/dL (ref >39.00)
NonHDL: 172.52
Total CHOL/HDL Ratio: 5
Triglycerides: 208 mg/dL — ABNORMAL HIGH (ref 0.0–149.0)
VLDL: 41.6 mg/dL — ABNORMAL HIGH (ref 0.0–40.0)

## 2019-12-09 LAB — VITAMIN D 25 HYDROXY (VIT D DEFICIENCY, FRACTURES): VITD: 41.01 ng/mL (ref 30.00–100.00)

## 2019-12-09 NOTE — Progress Notes (Signed)
PCP notes:  Health Maintenance: No gaps noted   Abnormal Screenings: none   Patient concerns: none   Nurse concerns: none   Next PCP appt.: 12/13/2019 @ 12 pm

## 2019-12-09 NOTE — Progress Notes (Addendum)
Subjective:   Alyssa Nolan is a 79 y.o. female who presents for Medicare Annual (Subsequent) preventive examination.  Review of Systems: N/A   This visit is being conducted through telemedicine via telephone at the nurse health advisor's home address due to the COVID-19 pandemic. This patient has given me verbal consent via doximity to conduct this visit, patient states they are participating from their home address. Patient and myself are on the telephone call. There is no referral for this visit. Some vital signs may be absent or patient reported.    Patient identification: identified by name, DOB, and current address   Cardiac Risk Factors include: advanced age (>81mn, >>64women);hypertension;dyslipidemia     Objective:     Vitals: There were no vitals taken for this visit.  There is no height or weight on file to calculate BMI.  Advanced Directives 12/09/2019 09/25/2019 12/05/2018 09/26/2018 09/20/2017 10/25/2016  Does Patient Have a Medical Advance Directive? Yes No Yes No No Yes  Type of AParamedicof AB[place] North[place] will - [place] will - - [place]  Copy of [place] Chart? No - copy requested - No - copy requested - - No - copy requested  Would patient like information on creating a medical advance directive? - No - Patient declined No - Patient declined No - Patient declined No - Patient declined -    Tobacco Social History   Tobacco Use  Smoking Status Former Smoker  . Types: Cigarettes  . Quit date: 11/14/1969  . Years since quitting: 50.1  Smokeless Tobacco Never Used     Counseling given: Not Answered   Clinical Intake:  Pre-visit preparation completed: Yes  Pain : No/denies pain     Nutritional Risks: None Diabetes: No  How often do you need to have someone help you when you read instructions, pamphlets, or other written materials from your  doctor or pharmacy?: 1 - Never What is the last grade level you completed in school?: college graduate  Interpreter Needed?: No  Information entered by :: CJohnson, LPN  Past Medical History:  Diagnosis Date  . Arthritis 2011   R hip injection per Dr. RNelva Bush . Breast cancer ([place] 04/28/2016   T1c, N0; ER/PR positive, HER-2/neu negative. INVASIVE DUCTAL CARCINOMA., DCIS  . GERD (gastroesophageal reflux disease)   . History of chickenpox   . Hyperlipidemia   . Hypertension   . Lichen planus   . Osteopenia    Tscore -1, consider repeat in ~2013, per Dr. EAmalia Haileywith gyn  . Personal history of radiation therapy 2017   MammoSite  . 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 BASAL CELL CARCINOMA EXCISION  01-22-14   chest area. Dr KNehemiah Massed . BREAST BIOPSY Right 2013   CORE - NEG, cyst with ductal hyperplasia without atypia Dr BBary Castilla . BREAST BIOPSY Right 04-28-16   Invasive ductal carcinoma  . BREAST CYST ASPIRATION Bilateral   . BREAST EXCISIONAL BIOPSY Right 1968   NEG  . BREAST LUMPECTOMY Right 2017   f/u mammosite  . BREAST LUMPECTOMY WITH NEEDLE LOCALIZATION Right 06/10/2016   Procedure: BREAST LUMPECTOMY WITH NEEDLE LOCALIZATION;  Surgeon: JRobert Bellow MD;  Location: [facility] ORS;  Service: General;  Laterality: Right;  . BREAST LUMPECTOMY WITH SENTINEL LYMPH NODE BIOPSY Right 06/10/2016   Procedure: BREAST LUMPECTOMY WITH SENTINEL LYMPH NODE BX /  WITH RECONSTRUCTION;  Surgeon: Robert Bellow, MD;  Location: [facility] ORS;  Service: General;  Laterality: Right;  . BREAST SURGERY  1968   Fibroadenoma  . CARDIOVASCULAR STRESS TEST  02/07/2002  . [place]  . CHOLECYSTECTOMY  2005   Dr Helane Rima, [place]  . COLONOSCOPY  04-22-02   Dr Ricarda Frame, [place]  . COLONOSCOPY  09-02-12   Dr Bary Castilla  . Endometrial ablation and right ovary removal  2002  . MRI, hip  01/12/2004  . MRI, neck  06/23/1998  10/31/2003  . OOPHORECTOMY  Bilateral 15+ YRS AGO  . Removal of excess eyelids and shortening of eyelid tendons  01/2010  . 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 Right hip injection  03/30/2009  . SKIN SURGERY  10/28/2019   removal of basal cell  . Touch Up of excess eyelids  04/20/2010   Family History  Problem Relation Age of Onset  . COPD Mother        Smoker  . Heart disease Father        Possible CHF  . Stroke Maternal Grandfather 83       CVA  . Colon cancer Other        mother's 1/2 sister  . Breast cancer Neg Hx   . Diabetes Neg Hx   . Liver disease Neg Hx   . Pancreatic cancer Neg Hx   . Esophageal cancer Neg Hx   . Stomach cancer Neg Hx    Social History   Socioeconomic History  . Marital status: Married    Spouse name: Not on file  . Number of children: 2  . Years of education: Not on file  . Highest education level: Not on file  Occupational History  . Occupation: Retired since 2007 and moved to [employer]    Employer: RETIRED  Tobacco Use  . Smoking status: Former Smoker    Types: Cigarettes    Quit date: 11/14/1969    Years since quitting: 50.1  . Smokeless tobacco: Never Used  Substance and Sexual Activity  . Alcohol use: Not Currently    Comment: Rare  . Drug use: No  . Sexual activity: Not Currently    Birth control/protection: Post-menopausal  Other Topics Concern  . Not on file  Social History Narrative   From [employer]:  [employer] grad   2 kids in [place] cards and likes to [employer]   Social Determinants of Health   Financial Resource Strain: Low Risk   . Difficulty of Paying Living Expenses: Not hard at all  Food Insecurity: No Food Insecurity  . Worried About Charity fundraiser in the Last Year: Never true  . Ran Out of Food in the Last Year: Never true  Transportation Needs: No Transportation Needs  . Lack of Transportation (Medical): No  . Lack of Transportation (Non-Medical): No  Physical Activity: Insufficiently  Active  . Days of Exercise per Week: 3 days  . Minutes of Exercise per Session: 40 min  Stress: No Stress Concern Present  . Feeling of Stress : Not at all  Social Connections:   . Frequency of Communication with Friends and Family: Not on file  . Frequency of Social Gatherings with Friends and Family: Not on file  . Attends Religious Services: Not on file  . Active Member of Clubs or Organizations: Not on file  . Attends Archivist Meetings: Not  on file  . Marital Status: Not on file    Outpatient Encounter Medications as of 12/09/2019  Medication Sig  . atorvastatin (LIPITOR) 20 MG tablet TAKE 1 TABLET BY MOUTH EVERY DAY  . Calcium Carbonate-Vitamin D (CALCIUM 600/VITAMIN D) 600-400 MG-UNIT per tablet Take 1 tablet by mouth 2 (two) times daily.   . carisoprodol (SOMA) 350 MG tablet TAKE 1 TABLET BY MOUTH EVERYDAY AT BEDTIME  . clobetasol ointment (TEMOVATE) 3.55 % Apply 1 application topically every other day.  . diclofenac sodium (VOLTAREN) 1 % GEL APPLY 2G TOPICALLY FOUR TIMES DAILY AS NEEDED  . letrozole (FEMARA) 2.5 MG tablet TAKE 1 TABLET (2.5 MG TOTAL) BY MOUTH DAILY.  Marland Kitchen loratadine (CLARITIN) 10 MG tablet Take 10 mg by mouth daily.    . meloxicam (MOBIC) 7.5 MG tablet Take 7.5 mg by mouth 2 (two) times daily.  . nitrofurantoin, macrocrystal-monohydrate, (MACROBID) 100 MG capsule Take 1 capsule (100 mg total) by mouth 2 (two) times daily.  Marland Kitchen omeprazole (PRILOSEC) 20 MG capsule Take 1 capsule (20 mg total) by mouth daily.  . polyethylene glycol powder (GLYCOLAX/MIRALAX) powder TAKE 1 CAPFUL(17G) BY MOUTH DAILY  . Propylene Glycol (SYSTANE BALANCE OP) Place 1 drop into both eyes daily.  . traMADol-acetaminophen (ULTRACET) 37.5-325 MG tablet TAKE 2 TABLETS BY MOUTH AT BEDTIME  . traZODone (DESYREL) 100 MG tablet TAKE 1.5 TABLETS BY MOUTH AT BEDTIME  . triamterene-hydrochlorothiazide (DYAZIDE) 37.5-25 MG capsule TAKE 1 CAPSULE BY MOUTH EVERY DAY IN THE MORNING  . vitamin E  400 UNIT capsule Take 400 Units by mouth daily.   No facility-administered encounter medications on file as of 12/09/2019.    Activities of Daily Living In your present state of health, do you have any difficulty performing the following activities: 12/09/2019  Hearing? Y  Comment wears hearing aids  Vision? N  Difficulty concentrating or making decisions? N  Walking or climbing stairs? N  Dressing or bathing? N  Doing errands, shopping? N  Preparing Food and eating ? N  Using the Toilet? N  In the past six months, have you accidently leaked urine? N  Do you have problems with loss of bowel control? N  Managing your Medications? N  Managing your Finances? N  Housekeeping or managing your Housekeeping? N  Some recent data might be hidden    Patient Care Team: Tonia Ghent, MD as PCP - General Defrancesco, Alanda Slim, MD as Referring Physician (Obstetrics and Gynecology) Bary Castilla Forest Gleason, MD as Consulting Physician (General Surgery) Ralene Bathe, MD as Consulting Physician (Dermatology) Latanya Maudlin, MD as Consulting Physician (Orthopedic Surgery) Arelia Sneddon, Leland as Consulting Physician (Optometry)    Assessment:   This is a routine wellness examination for [place].  Exercise Activities and Dietary recommendations Current Exercise Habits: Structured exercise class, Time (Minutes): 45, Frequency (Times/Week): 3, Weekly Exercise (Minutes/Week): 135, Intensity: Moderate, Exercise limited by: None identified  Goals    . Increase physical activity     Starting 12/05/2018, I will continue to exercise for at least 60 minutes 3 days per week.     . Patient Stated     12/09/2019, I will maintain and continue medications as prescribed.       Fall Risk Fall Risk  12/09/2019 12/05/2018 09/20/2017 10/25/2016 10/20/2015  Falls in the past year? 0 0 No No Yes  Number falls in past yr: 0 - - - 1  Injury with Fall? 0 - - - -  Risk for fall due to :  Medication side effect  - - - -  Follow up Falls evaluation completed;Falls prevention discussed - - - -   Is the patient's home free of loose throw rugs in walkways, pet beds, electrical cords, etc?   yes      Grab bars in the bathroom? no      Handrails on the stairs?   yes      Adequate lighting?   yes  Timed Get Up and Go performed: N/A  Depression Screen PHQ 2/9 Scores 12/09/2019 12/05/2018 09/20/2017 10/25/2016  PHQ - 2 Score 0 0 0 0  PHQ- 9 Score 0 0 - -     Cognitive Function MMSE - Mini Mental State Exam 12/09/2019 12/05/2018 10/25/2016  Orientation to time _0 Orientation to Place _1 Registration _2 Attention/ Calculation 5 0 0  Recall _3 Language- name 2 objects - 0 0  Language- repeat _4 Language- follow 3 step command - 3 3  Language- read & follow direction - 0 0  Write a sentence - 0 0  Copy design - 0 0  Total score - 20 20  Mini Cog  Mini-Cog screen was completed. Maximum score is 22. A value of 0 denotes this part of the MMSE was not completed or the patient failed this part of the Mini-Cog screening.       Immunization History  Administered Date(s) Administered  . Fluad Quad(high Dose 65+) 08/05/2019  . Influenza Split 08/10/2011, 08/10/2012  . Influenza Whole 08/06/2010  . Influenza, High Dose Seasonal PF 08/08/2018  . Influenza,inj,Quad PF,6+ Mos 09/12/2013, 07/30/2014, 08/07/2015, 09/15/2016, 09/08/2017  . Influenza-Unspecified 08/05/2019  . Pneumococcal Conjugate-13 09/18/2014  . Pneumococcal Polysaccharide-23 01/25/2011  . Td 07/07/2010  . Zoster 08/09/2010  . Zoster Recombinat (Shingrix) 09/04/2019    Qualifies for Shingles Vaccine? Completed 09/04/2019  Screening Tests Health Maintenance  Topic Date Due  . TETANUS/TDAP  07/07/2020  . INFLUENZA VACCINE  Completed  . DEXA SCAN  Completed  . PNA vac Low Risk Adult  Completed    Cancer Screenings: Lung: Low Dose CT Chest recommended if Age 50-80 years, 30 pack-year currently smoking OR have  quit w/in 15 years. Patient does not qualify. Breast:  Up to date on Mammogram? Yes, completed 06/28/2019   Up to date of Bone Density/Dexa? Yes, completed 10/02/2019 Colorectal: completed 08/15/2012  Additional Screenings:  Hepatitis C Screening: N/A     Plan:   Patient will maintain and continue medications as prescribed.   I have personally reviewed and noted the following in the patient's chart:   . Medical and social history . Use of alcohol, tobacco or illicit drugs  . Current medications and supplements . Functional ability and status . Nutritional status . Physical activity . Advanced directives . List of other physicians . Hospitalizations, surgeries, and ER visits in previous 12 months . Vitals . Screenings to include cognitive, depression, and falls . Referrals and appointments  In addition, I have reviewed and discussed with patient certain preventive protocols, quality metrics, and best practice recommendations. A written personalized care plan for preventive services as well as general preventive health recommendations were provided to patient.     Andrez Grime, LPN  07/01/5630    I reviewed health advisor's note, was available for consultation on the day of service listed in this note, and agree with documentation and plan. Elsie Stain, MD.

## 2019-12-09 NOTE — Patient Instructions (Signed)
Alyssa Nolan , Thank you for taking time to come for your Medicare Wellness Visit. I appreciate your ongoing commitment to your health goals. Please review the following plan we discussed and let me know if I can assist you in the future.   Screening recommendations/referrals: Colonoscopy: Up to date, completed 08/15/2012 Mammogram: Up to date, completed 06/28/2019 Bone Density: Up to date, completed 10/02/2019 Recommended yearly ophthalmology/optometry visit for glaucoma screening and checkup Recommended yearly dental visit for hygiene and checkup  Vaccinations: Influenza vaccine: Up to date, completed 08/05/2019 Pneumococcal vaccine: Completed series Tdap vaccine: Up to date, completed 07/07/2010 Shingles vaccine: completed 09/04/2019    Advanced directives: Please bring a copy of your POA (Power of [place]) and/or Living Will to your next appointment.   Conditions/risks identified: hypertension, hyperlipidemia  Next appointment: 12/13/2019 @ 12 pm    Preventive Care 65 Years and Older, Female Preventive care refers to lifestyle choices and visits with your health care provider that can promote health and wellness. What does preventive care include?  A yearly physical exam. This is also called an annual well check.  Dental exams once or twice a year.  Routine eye exams. Ask your health care provider how often you should have your eyes checked.  Personal lifestyle choices, including:  Daily care of your teeth and gums.  Regular physical activity.  Eating a healthy diet.  Avoiding tobacco and drug use.  Limiting alcohol use.  Practicing safe sex.  Taking low-dose aspirin every day.  Taking vitamin and mineral supplements as recommended by your health care provider. What happens during an annual well check? The services and screenings done by your health care provider during your annual well check will depend on your age, overall health, lifestyle risk factors, and  family history of disease. Counseling  Your health care provider may ask you questions about your:  Alcohol use.  Tobacco use.  Drug use.  Emotional well-being.  Home and relationship well-being.  Sexual activity.  Eating habits.  History of falls.  Memory and ability to understand (cognition).  Work and work Statistician.  Reproductive health. Screening  You may have the following tests or measurements:  Height, weight, and BMI.  Blood pressure.  Lipid and cholesterol levels. These may be checked every 5 years, or more frequently if you are over 50 years old.  Skin check.  Lung cancer screening. You may have this screening every year starting at age 61 if you have a 30-pack-year history of smoking and currently smoke or have quit within the past 15 years.  Fecal occult blood test (FOBT) of the stool. You may have this test every year starting at age 40.  Flexible sigmoidoscopy or colonoscopy. You may have a sigmoidoscopy every 5 years or a colonoscopy every 10 years starting at age 72.  Hepatitis C blood test.  Hepatitis B blood test.  Sexually transmitted disease (STD) testing.  Diabetes screening. This is done by checking your blood sugar (glucose) after you have not eaten for a while (fasting). You may have this done every 1-3 years.  Bone density scan. This is done to screen for osteoporosis. You may have this done starting at age 57.  Mammogram. This may be done every 1-2 years. Talk to your health care provider about how often you should have regular mammograms. Talk with your health care provider about your test results, treatment options, and if necessary, the need for more tests. Vaccines  Your health care provider may recommend certain vaccines, such  as:  Influenza vaccine. This is recommended every year.  Tetanus, diphtheria, and acellular pertussis (Tdap, Td) vaccine. You may need a Td booster every 10 years.  Zoster vaccine. You may need this  after age 21.  Pneumococcal 13-valent conjugate (PCV13) vaccine. One dose is recommended after age 43.  Pneumococcal polysaccharide (PPSV23) vaccine. One dose is recommended after age 50. Talk to your health care provider about which screenings and vaccines you need and how often you need them. This information is not intended to replace advice given to you by your health care provider. Make sure you discuss any questions you have with your health care provider. Document Released: 11/27/2015 Document Revised: 07/20/2016 Document Reviewed: 09/01/2015 Elsevier Interactive Patient Education  2017 [place] Prevention in the Home Falls can cause injuries. They can happen to people of all ages. There are many things you can do to make your home safe and to help prevent falls. What can I do on the outside of my home?  Regularly fix the edges of walkways and driveways and fix any cracks.  Remove anything that might make you trip as you walk through a door, such as a raised step or threshold.  Trim any bushes or trees on the path to your home.  Use bright outdoor lighting.  Clear any walking paths of anything that might make someone trip, such as rocks or tools.  Regularly check to see if handrails are loose or broken. Make sure that both sides of any steps have handrails.  Any raised decks and porches should have guardrails on the edges.  Have any leaves, snow, or ice cleared regularly.  Use sand or salt on walking paths during winter.  Clean up any spills in your garage right away. This includes oil or grease spills. What can I do in the bathroom?  Use night lights.  Install grab bars by the toilet and in the tub and shower. Do not use towel bars as grab bars.  Use non-skid mats or decals in the tub or shower.  If you need to sit down in the shower, use a plastic, non-slip stool.  Keep the floor dry. Clean up any water that spills on the floor as soon as it happens.   Remove soap buildup in the tub or shower regularly.  Attach bath mats securely with double-sided non-slip rug tape.  Do not have throw rugs and other things on the floor that can make you trip. What can I do in the bedroom?  Use night lights.  Make sure that you have a light by your bed that is easy to reach.  Do not use any sheets or blankets that are too big for your bed. They should not hang down onto the floor.  Have a firm chair that has side arms. You can use this for support while you get dressed.  Do not have throw rugs and other things on the floor that can make you trip. What can I do in the kitchen?  Clean up any spills right away.  Avoid walking on wet floors.  Keep items that you use a lot in easy-to-reach places.  If you need to reach something above you, use a strong step stool that has a grab bar.  Keep electrical cords out of the way.  Do not use floor polish or wax that makes floors slippery. If you must use wax, use non-skid floor wax.  Do not have throw rugs and other things on the  floor that can make you trip. What can I do with my stairs?  Do not leave any items on the stairs.  Make sure that there are handrails on both sides of the stairs and use them. Fix handrails that are broken or loose. Make sure that handrails are as long as the stairways.  Check any carpeting to make sure that it is firmly attached to the stairs. Fix any carpet that is loose or worn.  Avoid having throw rugs at the top or bottom of the stairs. If you do have throw rugs, attach them to the floor with carpet tape.  Make sure that you have a light switch at the top of the stairs and the bottom of the stairs. If you do not have them, ask someone to add them for you. What else can I do to help prevent falls?  Wear shoes that:  Do not have high heels.  Have rubber bottoms.  Are comfortable and fit you well.  Are closed at the toe. Do not wear sandals.  If you use a  stepladder:  Make sure that it is fully opened. Do not climb a closed stepladder.  Make sure that both sides of the stepladder are locked into place.  Ask someone to hold it for you, if possible.  Clearly mark and make sure that you can see:  Any grab bars or handrails.  First and last steps.  Where the edge of each step is.  Use tools that help you move around (mobility aids) if they are needed. These include:  Canes.  Walkers.  Scooters.  Crutches.  Turn on the lights when you go into a dark area. Replace any light bulbs as soon as they burn out.  Set up your furniture so you have a clear path. Avoid moving your furniture around.  If any of your floors are uneven, fix them.  If there are any pets around you, be aware of where they are.  Review your medicines with your doctor. Some medicines can make you feel dizzy. This can increase your chance of falling. Ask your doctor what other things that you can do to help prevent falls. This information is not intended to replace advice given to you by your health care provider. Make sure you discuss any questions you have with your health care provider. Document Released: 08/27/2009 Document Revised: 04/07/2016 Document Reviewed: 12/05/2014 Elsevier Interactive Patient Education  2017 [employer].

## 2019-12-13 ENCOUNTER — Ambulatory Visit (INDEPENDENT_AMBULATORY_CARE_PROVIDER_SITE_OTHER): Payer: Medicare Other | Admitting: Family Medicine

## 2019-12-13 ENCOUNTER — Other Ambulatory Visit: Payer: Self-pay

## 2019-12-13 ENCOUNTER — Encounter: Payer: Self-pay | Admitting: Family Medicine

## 2019-12-13 VITALS — BP 144/76 | HR 86 | Temp 96.7°F | Ht 64.0 in | Wt 163.4 lb

## 2019-12-13 DIAGNOSIS — I10 Essential (primary) hypertension: Secondary | ICD-10-CM | POA: Diagnosis not present

## 2019-12-13 DIAGNOSIS — G4700 Insomnia, unspecified: Secondary | ICD-10-CM | POA: Diagnosis not present

## 2019-12-13 DIAGNOSIS — D582 Other hemoglobinopathies: Secondary | ICD-10-CM

## 2019-12-13 DIAGNOSIS — E785 Hyperlipidemia, unspecified: Secondary | ICD-10-CM | POA: Diagnosis not present

## 2019-12-13 DIAGNOSIS — Z0000 Encounter for general adult medical examination without abnormal findings: Secondary | ICD-10-CM

## 2019-12-13 DIAGNOSIS — M1990 Unspecified osteoarthritis, unspecified site: Secondary | ICD-10-CM | POA: Diagnosis not present

## 2019-12-13 DIAGNOSIS — Z7189 Other specified counseling: Secondary | ICD-10-CM

## 2019-12-13 DIAGNOSIS — D0511 Intraductal carcinoma in situ of right breast: Secondary | ICD-10-CM

## 2019-12-13 MED ORDER — DICLOFENAC SODIUM 1 % EX GEL: 2.0000 g | Freq: Four times a day (QID) | CUTANEOUS | 3 refills | Status: DC | PRN

## 2019-12-13 NOTE — Patient Instructions (Signed)
If the aches are worse then hold atorvastatin for about 1 week and update me.  Take care.  Glad to see you. Let me see about options for your calcium and HGB levels.

## 2019-12-13 NOTE — Progress Notes (Signed)
This visit occurred during the SARS-CoV-2 public health emergency.  Safety protocols were in place, including screening questions prior to the visit, additional usage of staff PPE, and extensive cleaning of exam room while observing appropriate contact time as indicated for disinfecting solutions.  Her husband has chronic medical conditions, discussed.    Elevated Cholesterol: Using medications without problems: yes Muscle aches: she has aches at baseline, likely not from statin.  D/w pt about holding statin if aches are worse.  Diet compliance: yes Exercise: yes Labs d/w pt.    HGB elevation noted, similar to prev.  D/w pt.    Hypercalcemia d/w pt.  She has h/o high normal Ca++ at baseline.    Hypertension:    Using medication without problems or lightheadedness: yes Chest pain with exertion:no Edema:no Short of breath:no  Insomnia.  Trazodone used at baseline with some relief but occ with difficulty sleeping o/w.    Joint pain.  Still on meloxicam and topical diclofenac- used with relief.  S/p PT for bursitis.  She has some irritation near the right patella that may be exacerbated by gait change.  Breast cancer hx noted, still on letrozole at baseline.    Vaccines d/w pt.   Pfizer covid vaccine 12/06/19.   Has f/u pending.  She'll get 2nd shingles shot later on.   Advance directive- husband designated if patient were incapacitated Pap not due.   Colonoscopy 2013 DXA 2020 Mammogram 2020.    PMH and SH reviewed Meds, vitals, and allergies reviewed.   ROS: Per HPI unless specifically indicated in ROS section   GEN: nad, alert and oriented HEENT: ncat NECK: supple w/o LA CV: rrr. PULM: ctab, no inc wob ABD: soft, +bs EXT: no edema SKIN: no acute rash Right knee with normal range of motion and joint line is not tender to palpation.  No puffiness.  No erythema.  Able to bear weight.

## 2019-12-16 ENCOUNTER — Telehealth: Payer: Self-pay | Admitting: Radiology

## 2019-12-16 ENCOUNTER — Other Ambulatory Visit (INDEPENDENT_AMBULATORY_CARE_PROVIDER_SITE_OTHER): Payer: Medicare Other

## 2019-12-16 ENCOUNTER — Other Ambulatory Visit: Payer: Self-pay | Admitting: Family Medicine

## 2019-12-16 ENCOUNTER — Telehealth: Payer: Self-pay | Admitting: Family Medicine

## 2019-12-16 DIAGNOSIS — D582 Other hemoglobinopathies: Secondary | ICD-10-CM

## 2019-12-16 DIAGNOSIS — R7989 Other specified abnormal findings of blood chemistry: Secondary | ICD-10-CM | POA: Diagnosis not present

## 2019-12-16 LAB — CBC WITH DIFFERENTIAL/PLATELET
Absolute Monocytes: 713 cells/uL (ref 200–950)
Basophils Absolute: 72 cells/uL (ref 0–200)
Basophils Relative: 1 %
Eosinophils Absolute: 187 cells/uL (ref 15–500)
Eosinophils Relative: 2.6 %
HCT: 46.7 % — ABNORMAL HIGH (ref 35.0–45.0)
Hemoglobin: 15.9 g/dL — ABNORMAL HIGH (ref 11.7–15.5)
Lymphs Abs: 1793 cells/uL (ref 850–3900)
MCH: 31 pg (ref 27.0–33.0)
MCHC: 34 g/dL (ref 32.0–36.0)
MCV: 91 fL (ref 80.0–100.0)
MPV: 11.7 fL (ref 7.5–12.5)
Monocytes Relative: 9.9 %
Neutro Abs: 4435 cells/uL (ref 1500–7800)
Neutrophils Relative %: 61.6 %
Platelets: 159 10*3/uL (ref 140–400)
RBC: 5.13 10*6/uL — ABNORMAL HIGH (ref 3.80–5.10)
RDW: 11.9 % (ref 11.0–15.0)
Total Lymphocyte: 24.9 %
WBC: 7.2 10*3/uL (ref 3.8–10.8)

## 2019-12-16 NOTE — Telephone Encounter (Signed)
Pt made appt for this afternoon.

## 2019-12-16 NOTE — Assessment & Plan Note (Signed)
Discussed options.  Can continue statin for now but if her aches are worse then she can hold the statin and see if she improves.

## 2019-12-16 NOTE — Assessment & Plan Note (Signed)
She can take meloxicam as needed.  She is also use topical diclofenac as needed for bursitis.  That helped a lot with her hip.  She likely has gait changes that exacerbated knee pain and we discussed icing as needed and continuing to treat her hip bursitis.  She will update me as needed.

## 2019-12-16 NOTE — Assessment & Plan Note (Signed)
History of,still on letrozole at baseline.  Continue as is.  No lumps or masses noted.  She will update me as needed.  Up-to-date on mammogram.

## 2019-12-16 NOTE — Assessment & Plan Note (Signed)
Vaccines d/w pt.   Pfizer covid vaccine 12/06/19.   Has f/u pending.  She'll get 2nd shingles shot later on.   Advance directive- husband designated if patient were incapacitated Pap not due.   Colonoscopy 2013 DXA 2020 Mammogram 2020.

## 2019-12-16 NOTE — Telephone Encounter (Signed)
Schedule a non fasting lab appt per Dr Damita Dunnings

## 2019-12-16 NOTE — Addendum Note (Signed)
Addended by: Ellamae Sia on: 12/16/2019 03:15 PM   Modules accepted: Orders

## 2019-12-16 NOTE — Assessment & Plan Note (Signed)
Advance directive- husband designated if patient were incapacitated.  

## 2019-12-16 NOTE — Assessment & Plan Note (Signed)
No change in meds.  Continue work on diet and exercise.  Labs discussed with patient.  She agrees.

## 2019-12-16 NOTE — Assessment & Plan Note (Signed)
See follow-up phone note/labs.

## 2019-12-16 NOTE — Telephone Encounter (Signed)
Please call patient.  Needs follow-up lab visit.  Please make sure she is well-hydrated when she comes in.  She does not need to fast.  Thanks.

## 2019-12-16 NOTE — Assessment & Plan Note (Addendum)
Trazodone used at baseline with some relief but occ with difficulty sleeping o/w.  Continue as is.  She will update me as needed.

## 2019-12-17 LAB — IRON: Iron: 78 ug/dL (ref 42–145)

## 2019-12-21 LAB — PATHOLOGIST SMEAR REVIEW

## 2019-12-21 LAB — PTH-RELATED PEPTIDE: PTH-Related Protein (PTH-RP): 17 pg/mL (ref 14–27)

## 2019-12-21 LAB — PTH, INTACT AND CALCIUM
Calcium: 10.1 mg/dL (ref 8.6–10.4)
PTH: 63 pg/mL (ref 14–64)

## 2020-01-01 DIAGNOSIS — L570 Actinic keratosis: Secondary | ICD-10-CM | POA: Diagnosis not present

## 2020-01-01 DIAGNOSIS — Z85828 Personal history of other malignant neoplasm of skin: Secondary | ICD-10-CM | POA: Diagnosis not present

## 2020-01-01 DIAGNOSIS — L578 Other skin changes due to chronic exposure to nonionizing radiation: Secondary | ICD-10-CM | POA: Diagnosis not present

## 2020-01-01 DIAGNOSIS — Z872 Personal history of diseases of the skin and subcutaneous tissue: Secondary | ICD-10-CM | POA: Diagnosis not present

## 2020-01-01 DIAGNOSIS — L820 Inflamed seborrheic keratosis: Secondary | ICD-10-CM | POA: Diagnosis not present

## 2020-01-19 ENCOUNTER — Other Ambulatory Visit: Payer: Self-pay | Admitting: Family Medicine

## 2020-01-20 NOTE — Telephone Encounter (Signed)
Last filled 10-23-19 #90 Last OV 12-13-19 No Future OV [facility]

## 2020-01-21 NOTE — Telephone Encounter (Signed)
Sent. Thanks.   

## 2020-01-30 ENCOUNTER — Other Ambulatory Visit: Payer: Self-pay | Admitting: Family Medicine

## 2020-01-31 NOTE — Telephone Encounter (Signed)
Last office visit 12/13/2019 for CPE. Last refilled 07/26/2019 for #180 with 1 refill.  No future appointment with PCP.

## 2020-02-02 NOTE — Telephone Encounter (Signed)
Sent. Thanks.  Okay to continue. 

## 2020-02-20 ENCOUNTER — Other Ambulatory Visit: Payer: Self-pay

## 2020-02-20 ENCOUNTER — Ambulatory Visit (INDEPENDENT_AMBULATORY_CARE_PROVIDER_SITE_OTHER): Payer: Medicare Other

## 2020-02-20 DIAGNOSIS — Z23 Encounter for immunization: Secondary | ICD-10-CM

## 2020-02-20 NOTE — Progress Notes (Signed)
Per orders of Dr. Damita Dunnings, injection of shingrix given by Randall An. Patient tolerated injection well. Pt signed Waiver of Liability and was given a copy.

## 2020-03-15 ENCOUNTER — Other Ambulatory Visit: Payer: Self-pay | Admitting: Family Medicine

## 2020-04-04 ENCOUNTER — Other Ambulatory Visit: Payer: Self-pay | Admitting: Family Medicine

## 2020-04-06 NOTE — Telephone Encounter (Signed)
Last office visit 12/13/2019 for CPE.  Last refilled 12/13/2019 for 100 g with 3 refills.  No future appointments with PCP.

## 2020-04-07 NOTE — Telephone Encounter (Signed)
Okay to continue.  rx sent.  Thanks.  

## 2020-05-27 ENCOUNTER — Other Ambulatory Visit: Payer: Self-pay | Admitting: General Surgery

## 2020-05-27 DIAGNOSIS — C50411 Malignant neoplasm of upper-outer quadrant of right female breast: Secondary | ICD-10-CM

## 2020-05-31 ENCOUNTER — Other Ambulatory Visit: Payer: Self-pay | Admitting: Internal Medicine

## 2020-06-05 ENCOUNTER — Other Ambulatory Visit: Payer: Self-pay | Admitting: Family Medicine

## 2020-06-05 NOTE — Telephone Encounter (Signed)
Refill request Trazodone Last refill 10/29/19 #135/1 Last office visit 12/13/19

## 2020-06-07 NOTE — Telephone Encounter (Signed)
Sent. Thanks.   

## 2020-06-10 DIAGNOSIS — M545 Low back pain: Secondary | ICD-10-CM | POA: Diagnosis not present

## 2020-06-29 ENCOUNTER — Other Ambulatory Visit: Payer: Self-pay

## 2020-06-29 ENCOUNTER — Ambulatory Visit
Admission: RE | Admit: 2020-06-29 | Discharge: 2020-06-29 | Disposition: A | Discharge status: Home / Self Care | Payer: Medicare Other | Source: Ambulatory Visit | Attending: General Surgery | Admitting: General Surgery

## 2020-06-29 DIAGNOSIS — C50411 Malignant neoplasm of upper-outer quadrant of right female breast: Secondary | ICD-10-CM | POA: Insufficient documentation

## 2020-06-29 DIAGNOSIS — Z170 Estrogen receptor positive status [ER+]: Secondary | ICD-10-CM | POA: Insufficient documentation

## 2020-06-29 DIAGNOSIS — R922 Inconclusive mammogram: Secondary | ICD-10-CM | POA: Diagnosis not present

## 2020-07-02 ENCOUNTER — Encounter: Payer: Self-pay | Admitting: Dermatology

## 2020-07-02 ENCOUNTER — Other Ambulatory Visit: Payer: Self-pay

## 2020-07-02 ENCOUNTER — Ambulatory Visit (INDEPENDENT_AMBULATORY_CARE_PROVIDER_SITE_OTHER): Payer: Medicare Other | Admitting: Dermatology

## 2020-07-02 DIAGNOSIS — Z85828 Personal history of other malignant neoplasm of skin: Secondary | ICD-10-CM | POA: Diagnosis not present

## 2020-07-02 DIAGNOSIS — D1800 Hemangioma unspecified site: Secondary | ICD-10-CM

## 2020-07-02 DIAGNOSIS — L570 Actinic keratosis: Secondary | ICD-10-CM | POA: Diagnosis not present

## 2020-07-02 DIAGNOSIS — L578 Other skin changes due to chronic exposure to nonionizing radiation: Secondary | ICD-10-CM | POA: Diagnosis not present

## 2020-07-02 DIAGNOSIS — L820 Inflamed seborrheic keratosis: Secondary | ICD-10-CM

## 2020-07-02 DIAGNOSIS — D229 Melanocytic nevi, unspecified: Secondary | ICD-10-CM

## 2020-07-02 DIAGNOSIS — L814 Other melanin hyperpigmentation: Secondary | ICD-10-CM

## 2020-07-02 DIAGNOSIS — L821 Other seborrheic keratosis: Secondary | ICD-10-CM | POA: Diagnosis not present

## 2020-07-02 DIAGNOSIS — Z1283 Encounter for screening for malignant neoplasm of skin: Secondary | ICD-10-CM

## 2020-07-02 DIAGNOSIS — D485 Neoplasm of uncertain behavior of skin: Secondary | ICD-10-CM | POA: Diagnosis not present

## 2020-07-02 NOTE — Patient Instructions (Signed)

## 2020-07-02 NOTE — Progress Notes (Signed)
Follow-Up Visit   Subjective  Alyssa Nolan is a 79 y.o. female who presents for the following: Annual Exam (Hx of BCC and SCC ). Patient does have crusty, itchy lesions on the back that she would like checked.   The following portions of the chart were reviewed this encounter and updated as appropriate:  Tobacco  Allergies  Meds  Problems  Med Hx  Surg Hx  Fam Hx     Review of Systems:  No other skin or systemic complaints except as noted in HPI or Assessment and Plan.  Objective  Well appearing patient in no apparent distress; mood and affect are within normal limits.  A full examination was performed including scalp, head, eyes, ears, nose, lips, neck, chest, axillae, abdomen, back, buttocks, bilateral upper extremities, bilateral lower extremities, hands, feet, fingers, toes, fingernails, and toenails. All findings within normal limits unless otherwise noted below.  Objective  back: Erythematous keratotic or waxy stuck-on papule or plaque.   Objective  R neck supra clavicular: 1.0 x 0.6 cm pink patch    Assessment & Plan  Inflamed seborrheic keratosis back  Destruction of lesion - back Complexity: simple   Destruction method: cryotherapy   Informed consent: discussed and consent obtained   Timeout:  patient name, date of birth, surgical site, and procedure verified Lesion destroyed using liquid nitrogen: Yes   Region frozen until ice ball extended beyond lesion: Yes   Outcome: patient tolerated procedure well with no complications   Post-procedure details: wound care instructions given    Neoplasm of uncertain behavior of skin R neck supra clavicular  Skin / nail biopsy Type of biopsy: tangential   Informed consent: discussed and consent obtained   Timeout: patient name, date of birth, surgical site, and procedure verified   Procedure prep:  Patient was prepped and draped in usual sterile fashion Prep type:  Isopropyl alcohol Anesthesia: the lesion  was anesthetized in a standard fashion   Anesthetic:  1% lidocaine w/ epinephrine 1-100,000 buffered w/ 8.4% NaHCO3 Instrument used: flexible razor blade   Hemostasis achieved with: pressure, aluminum chloride and electrodesiccation   Outcome: patient tolerated procedure well   Post-procedure details: sterile dressing applied and wound care instructions given   Dressing type: bandage and petrolatum    Specimen 1 - Surgical pathology Differential Diagnosis: D48.5 r/o CA Check Margins: No 1.0 x 0.6 cm pink patch  Skin cancer screening  Lentigines - Scattered tan macules - Discussed due to sun exposure - Benign, observe - Call for any changes  Seborrheic Keratoses - Stuck-on, waxy, tan-brown papules and plaques  - Discussed benign etiology and prognosis. - Observe - Call for any changes  Melanocytic Nevi - Tan-brown and/or pink-flesh-colored symmetric macules and papules - Benign appearing on exam today - Observation - Call clinic for new or changing moles - Recommend daily use of broad spectrum spf 30+ sunscreen to sun-exposed areas.   Hemangiomas - Red papules - Discussed benign nature - Observe - Call for any changes  Actinic Damage - diffuse scaly erythematous macules with underlying dyspigmentation - Recommend daily broad spectrum sunscreen SPF 30+ to sun-exposed areas, reapply every 2 hours as needed.  - Call for new or changing lesions.  History of Basal Cell Carcinoma of the Skin - No evidence of recurrence today - Recommend regular full body skin exams - Recommend daily broad spectrum sunscreen SPF 30+ to sun-exposed areas, reapply every 2 hours as needed.  - Call if any new or changing lesions  are noted between office visits  History of Squamous Cell Carcinoma of the Skin - No evidence of recurrence today - No lymphadenopathy - Recommend regular full body skin exams - Recommend daily broad spectrum sunscreen SPF 30+ to sun-exposed areas, reapply every 2  hours as needed.  - Call if any new or changing lesions are noted between office visits  Skin cancer screening performed today.  Return in about 1 year (around 07/02/2021) for TBSE.  Luther Redo, CMA, am acting as scribe for Sarina Ser, MD .  Documentation: I have reviewed the above documentation for accuracy and completeness, and I agree with the above.  Sarina Ser, MD

## 2020-07-07 ENCOUNTER — Telehealth: Payer: Self-pay

## 2020-07-07 NOTE — Telephone Encounter (Signed)
-----   Message from Ralene Bathe, MD sent at 07/07/2020  9:08 AM EDT ----- Skin , right neck supra clavicular ACTINIC KERATOSIS AND SEBORRHEIC KERATOSIS  PreCancer Schedule for treatment (Ln2)

## 2020-07-07 NOTE — Telephone Encounter (Signed)
LM on VM please call here to discuss biopsy results  

## 2020-07-09 ENCOUNTER — Encounter: Payer: Self-pay | Admitting: Dermatology

## 2020-07-09 NOTE — Telephone Encounter (Signed)
Patient advised of BX results and scheduled for LN2.

## 2020-07-11 ENCOUNTER — Encounter: Payer: Self-pay | Admitting: Family Medicine

## 2020-07-13 ENCOUNTER — Encounter: Payer: Self-pay | Admitting: Family Medicine

## 2020-07-15 ENCOUNTER — Other Ambulatory Visit: Payer: Self-pay | Admitting: Family Medicine

## 2020-07-15 DIAGNOSIS — I10 Essential (primary) hypertension: Secondary | ICD-10-CM

## 2020-07-15 MED ORDER — CLOBETASOL PROPIONATE 0.05 % EX OINT: 1.0000 "application " | TOPICAL_OINTMENT | CUTANEOUS | 2 refills | Status: DC

## 2020-07-17 ENCOUNTER — Other Ambulatory Visit: Payer: Self-pay

## 2020-07-17 ENCOUNTER — Other Ambulatory Visit (INDEPENDENT_AMBULATORY_CARE_PROVIDER_SITE_OTHER): Payer: Medicare Other

## 2020-07-17 DIAGNOSIS — I10 Essential (primary) hypertension: Secondary | ICD-10-CM | POA: Diagnosis not present

## 2020-07-17 NOTE — Addendum Note (Signed)
Addended by: Ellamae Sia on: 07/17/2020 02:17 PM   Modules accepted: Orders

## 2020-07-18 ENCOUNTER — Other Ambulatory Visit: Payer: Self-pay | Admitting: Family Medicine

## 2020-07-18 LAB — COMPREHENSIVE METABOLIC PANEL
AG Ratio: 1.7 (calc) (ref 1.0–2.5)
ALT: 8 U/L (ref 6–29)
AST: 15 U/L (ref 10–35)
Albumin: 4.1 g/dL (ref 3.6–5.1)
Alkaline phosphatase (APISO): 73 U/L (ref 37–153)
BUN: 17 mg/dL (ref 7–25)
CO2: 28 mmol/L (ref 20–32)
Calcium: 9.8 mg/dL (ref 8.6–10.4)
Chloride: 103 mmol/L (ref 98–110)
Creat: 0.83 mg/dL (ref 0.60–0.93)
Globulin: 2.4 g/dL (calc) (ref 1.9–3.7)
Glucose, Bld: 86 mg/dL (ref 65–99)
Potassium: 3.7 mmol/L (ref 3.5–5.3)
Sodium: 140 mmol/L (ref 135–146)
Total Bilirubin: 0.4 mg/dL (ref 0.2–1.2)
Total Protein: 6.5 g/dL (ref 6.1–8.1)

## 2020-07-21 ENCOUNTER — Other Ambulatory Visit: Payer: Self-pay | Admitting: Family Medicine

## 2020-07-21 MED ORDER — CARISOPRODOL 350 MG PO TABS: ORAL_TABLET | ORAL | 1 refills | Status: DC

## 2020-07-21 NOTE — Progress Notes (Signed)
Pt needed refill on soma.  rx sent.  Pt aware.

## 2020-07-21 NOTE — Telephone Encounter (Signed)
Electronic refill request. Carisoprodol Last office visit:   12/13/2019 Last Filled:

## 2020-07-30 DIAGNOSIS — Z853 Personal history of malignant neoplasm of breast: Secondary | ICD-10-CM | POA: Diagnosis not present

## 2020-07-31 ENCOUNTER — Other Ambulatory Visit: Payer: Self-pay | Admitting: Family Medicine

## 2020-07-31 NOTE — Telephone Encounter (Signed)
Electronic refill request. Tramadol Last office visit:   12/13/2019 Last Filled:    180 tablet 1 02/02/2020

## 2020-08-02 NOTE — Telephone Encounter (Signed)
Sent. Thanks.   

## 2020-08-19 ENCOUNTER — Encounter: Payer: Self-pay | Admitting: Family Medicine

## 2020-08-20 ENCOUNTER — Ambulatory Visit (INDEPENDENT_AMBULATORY_CARE_PROVIDER_SITE_OTHER): Payer: Medicare Other | Admitting: Dermatology

## 2020-08-20 ENCOUNTER — Other Ambulatory Visit: Payer: Self-pay

## 2020-08-20 DIAGNOSIS — L578 Other skin changes due to chronic exposure to nonionizing radiation: Secondary | ICD-10-CM

## 2020-08-20 DIAGNOSIS — L570 Actinic keratosis: Secondary | ICD-10-CM | POA: Diagnosis not present

## 2020-08-20 NOTE — Progress Notes (Addendum)
   Follow-Up Visit   Subjective  Alyssa Nolan is a 79 y.o. female who presents for the following: Actinic Keratosis (Biopsy proven Ak right neck supra clavicular pt here for LN2).  The following portions of the chart were reviewed this encounter and updated as appropriate:  Tobacco  Allergies  Meds  Problems  Med Hx  Surg Hx  Fam Hx     Review of Systems:  No other skin or systemic complaints except as noted in HPI or Assessment and Plan.  Objective  Well appearing patient in no apparent distress; mood and affect are within normal limits.  A focused examination was performed including neck . Relevant physical exam findings are noted in the Assessment and Plan.  Objective  R neck supra clavicle: Erythematous thin papules/macules with gritty scale.    Assessment & Plan  AK (actinic keratosis) R neck supra clavicle  Biopsy proven AK   Destruction of lesion - R neck supra clavicle Complexity: simple   Destruction method: cryotherapy   Informed consent: discussed and consent obtained   Timeout:  patient name, date of birth, surgical site, and procedure verified Lesion destroyed using liquid nitrogen: Yes   Region frozen until ice ball extended beyond lesion: Yes   Outcome: patient tolerated procedure well with no complications   Post-procedure details: wound care instructions given    Actinic Damage - diffuse scaly erythematous macules with underlying dyspigmentation - Recommend daily broad spectrum sunscreen SPF 30+ to sun-exposed areas, reapply every 2 hours as needed.  - Call for new or changing lesions.  Return for as scheduled .  IMarye Round, CMA, am acting as scribe for Sarina Ser, MD .  Documentation: I have reviewed the above documentation for accuracy and completeness, and I agree with the above.  Sarina Ser, MD

## 2020-08-20 NOTE — Patient Instructions (Signed)
Cryotherapy Aftercare  . Wash gently with soap and water everyday.   . Apply Vaseline and Band-Aid daily until healed.  

## 2020-08-21 ENCOUNTER — Encounter: Payer: Self-pay | Admitting: Dermatology

## 2020-08-25 ENCOUNTER — Other Ambulatory Visit: Payer: Self-pay | Admitting: Internal Medicine

## 2020-08-25 ENCOUNTER — Other Ambulatory Visit: Payer: Self-pay | Admitting: Family Medicine

## 2020-09-02 ENCOUNTER — Encounter: Payer: Self-pay | Admitting: Family Medicine

## 2020-09-04 ENCOUNTER — Encounter: Payer: Self-pay | Admitting: Family Medicine

## 2020-09-04 DIAGNOSIS — Z23 Encounter for immunization: Secondary | ICD-10-CM | POA: Diagnosis not present

## 2020-09-09 DIAGNOSIS — H60549 Acute eczematoid otitis externa, unspecified ear: Secondary | ICD-10-CM | POA: Diagnosis not present

## 2020-09-28 ENCOUNTER — Ambulatory Visit: Payer: Medicare Other | Admitting: Radiation Oncology

## 2020-10-07 ENCOUNTER — Ambulatory Visit: Payer: Medicare Other | Admitting: Radiation Oncology

## 2020-10-07 ENCOUNTER — Ambulatory Visit: Admission: RE | Admit: 2020-10-07 | Payer: Medicare Other | Source: Ambulatory Visit | Admitting: Radiation Oncology

## 2020-10-07 DIAGNOSIS — Z170 Estrogen receptor positive status [ER+]: Secondary | ICD-10-CM

## 2020-10-20 ENCOUNTER — Encounter: Payer: Self-pay | Admitting: Radiation Oncology

## 2020-10-21 ENCOUNTER — Encounter: Payer: Self-pay | Admitting: Radiation Oncology

## 2020-10-21 ENCOUNTER — Ambulatory Visit
Admission: RE | Admit: 2020-10-21 | Discharge: 2020-10-21 | Disposition: A | Discharge status: Home / Self Care | Payer: Medicare Other | Source: Ambulatory Visit | Attending: Radiation Oncology | Admitting: Radiation Oncology

## 2020-10-21 VITALS — BP 130/75 | HR 84 | Temp 97.0°F | Wt 163.0 lb

## 2020-10-21 DIAGNOSIS — D0511 Intraductal carcinoma in situ of right breast: Secondary | ICD-10-CM | POA: Diagnosis not present

## 2020-10-21 DIAGNOSIS — Z170 Estrogen receptor positive status [ER+]: Secondary | ICD-10-CM

## 2020-10-21 NOTE — Progress Notes (Signed)
Radiation Oncology Follow up Note  Name: Alyssa Nolan   Date:   10/21/2020 MRN:  [id number] DOB: [birth year]    This 79 y.o. 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  REFERRING PROVIDER: Tonia Ghent, MD  HPI: Patient is a 79 year old female now out close to 5 years having completed accelerated partial breast irradiation to her right breast for ER/PR positive ductal carcinoma in situ.  Seen today in routine follow-up she is doing well specifically denies breast tenderness cough or bone pain.  Her last mammogram was back in August.  Which I have reviewed was a BI-RADS 2 benign.  She is currently on letrozole tolerating it well without side effect.  COMPLICATIONS OF TREATMENT: none  FOLLOW UP COMPLIANCE: keeps appointments   PHYSICAL EXAM:  BP 130/75   Pulse 84   Temp (!) 97 F (36.1 C) (Tympanic)   Wt 163 lb (73.9 kg)   BMI 27.98 kg/m  Lungs are clear to A&P cardiac examination essentially unremarkable with regular rate and rhythm. No dominant mass or nodularity is noted in either breast in 2 positions examined. Incision is well-healed. No axillary or supraclavicular adenopathy is appreciated. Cosmetic result is excellent.  Well-developed well-nourished patient in NAD. HEENT reveals PERLA, EOMI, discs not visualized.  Oral cavity is clear. No oral mucosal lesions are identified. Neck is clear without evidence of cervical or supraclavicular adenopathy. Lungs are clear to A&P. Cardiac examination is essentially unremarkable with regular rate and rhythm without murmur rub or thrill. Abdomen is benign with no organomegaly or masses noted. Motor sensory and DTR levels are equal and symmetric in the upper and lower extremities. Cranial nerves II through XII are grossly intact. Proprioception is intact. No peripheral adenopathy or edema is identified. No  motor or sensory levels are noted. Crude visual fields are within normal range.  RADIOLOGY RESULTS: Mammograms reviewed compatible with above-stated findings  PLAN: Present time patient is doing well with no evidence of disease close to 5 years out I am going to discontinue follow-up care.  Patient will continue have mammograms scheduled.  Patient knows to call with any concerns at any time.  I would like to take this opportunity to thank you for allowing me to participate in the care of your patient.Noreene Filbert, MD

## 2020-11-22 ENCOUNTER — Other Ambulatory Visit: Payer: Self-pay | Admitting: Internal Medicine

## 2020-11-30 ENCOUNTER — Other Ambulatory Visit: Payer: Self-pay | Admitting: Family Medicine

## 2020-12-05 ENCOUNTER — Other Ambulatory Visit: Payer: Self-pay | Admitting: Family Medicine

## 2020-12-31 ENCOUNTER — Other Ambulatory Visit: Payer: Self-pay | Admitting: Internal Medicine

## 2021-01-10 ENCOUNTER — Other Ambulatory Visit: Payer: Self-pay | Admitting: Family Medicine

## 2021-01-10 DIAGNOSIS — D509 Iron deficiency anemia, unspecified: Secondary | ICD-10-CM

## 2021-01-10 DIAGNOSIS — E559 Vitamin D deficiency, unspecified: Secondary | ICD-10-CM

## 2021-01-10 DIAGNOSIS — M8580 Other specified disorders of bone density and structure, unspecified site: Secondary | ICD-10-CM

## 2021-01-10 DIAGNOSIS — E785 Hyperlipidemia, unspecified: Secondary | ICD-10-CM

## 2021-01-12 ENCOUNTER — Other Ambulatory Visit: Payer: Self-pay

## 2021-01-12 ENCOUNTER — Encounter: Payer: Self-pay | Admitting: Gastroenterology

## 2021-01-12 ENCOUNTER — Ambulatory Visit (INDEPENDENT_AMBULATORY_CARE_PROVIDER_SITE_OTHER): Payer: Medicare Other | Admitting: Gastroenterology

## 2021-01-12 VITALS — BP 119/60 | HR 79 | Ht 64.0 in | Wt 164.2 lb

## 2021-01-12 DIAGNOSIS — R143 Flatulence: Secondary | ICD-10-CM

## 2021-01-12 DIAGNOSIS — R140 Abdominal distension (gaseous): Secondary | ICD-10-CM | POA: Diagnosis not present

## 2021-01-12 DIAGNOSIS — K5909 Other constipation: Secondary | ICD-10-CM

## 2021-01-12 DIAGNOSIS — K219 Gastro-esophageal reflux disease without esophagitis: Secondary | ICD-10-CM

## 2021-01-12 MED ORDER — OMEPRAZOLE 20 MG PO CPDR: DELAYED_RELEASE_CAPSULE | ORAL | 3 refills | Status: DC

## 2021-01-12 NOTE — Patient Instructions (Signed)
If you are age 78 or older, your body mass index should be between 23-30. Your Body mass index is 28.18 kg/m. If this is out of the aforementioned range listed, please consider follow up with your Primary Care Provider.  If you are age 85 or younger, your body mass index should be between 19-25. Your Body mass index is 28.18 kg/m. If this is out of the aformentioned range listed, please consider follow up with your Primary Care Provider.   Refilled Omeprazole.  Consider starting daily probiotic such as Align.

## 2021-01-12 NOTE — Progress Notes (Signed)
01/12/2021 [place] [id number] 09/22/1941   HISTORY OF PRESENT ILLNESS: This is a 80 year old female who has a history of GERD and chronic constipation.  She is a patient of Dr. Vena Rua.  She was last seen here actually via televisit by Dr. Hilarie Fredrickson in July 2020.  She reports that she has been doing well.  Her reflux is well controlled on omeprazole 20 mg daily and she is needing refills on that.  No dysphagia or odynophagia.  No abdominal pain.  No nausea or vomiting.  For her chronic constipation she continues on MiraLAX 17 g daily.  She says that sometimes she feels like it works too much and has decreased the dose for period of time in the past.  She denies seeing blood in her stools.  Her last colonoscopy was in October 2013 in which time a 10-year recall was recommended    Past Medical History:  Diagnosis Date  . Arthritis 2011   R hip injection per Dr. Nelva Bush  . Basal cell carcinoma 05/04/2010   ant and sup edge of left nose and nasal alar crease  . Basal cell carcinoma 01/22/2014   right chest/excision  . Basal cell carcinoma 10/28/2019   right crease nasal ala  . Breast cancer ([place]) 04/28/2016   T1c, N0; ER/PR positive, HER-2/neu negative. INVASIVE DUCTAL CARCINOMA., DCIS  . GERD (gastroesophageal reflux disease)   . History of chickenpox   . Hyperlipidemia   . Hypertension   . Lichen planus   . Osteopenia    Tscore -1, consider repeat in ~2013, per Dr. Amalia Hailey with gyn  . Personal history of radiation therapy 2017   MammoSite  . Skin cancer    BCC, SCC on nose  . Squamous cell carcinoma of skin 03/29/2010   left nose ant alar crease/in situ  . Squamous cell carcinoma of skin 12/30/2013   left prox zygoma/in situ   Past Surgical History:  Procedure Laterality Date  . BASAL CELL CARCINOMA EXCISION  01-22-14   chest area. Dr Nehemiah Massed  . BREAST BIOPSY Right 2013   CORE - NEG, cyst with ductal hyperplasia without atypia Dr Bary Castilla  . BREAST BIOPSY Right  04-28-16   Invasive ductal carcinoma  . BREAST CYST ASPIRATION Bilateral   . BREAST EXCISIONAL BIOPSY Right 1968   NEG  . BREAST LUMPECTOMY Right 2017   IMC/DCIS, negative LNs  . BREAST LUMPECTOMY WITH NEEDLE LOCALIZATION Right 06/10/2016   Procedure: BREAST LUMPECTOMY WITH NEEDLE LOCALIZATION;  Surgeon: Robert Bellow, MD;  Location: [facility] ORS;  Service: General;  Laterality: Right;  . BREAST LUMPECTOMY WITH SENTINEL LYMPH NODE BIOPSY Right 06/10/2016   Procedure: BREAST LUMPECTOMY WITH SENTINEL LYMPH NODE BX / WITH RECONSTRUCTION;  Surgeon: Robert Bellow, MD;  Location: [facility] ORS;  Service: General;  Laterality: Right;  . BREAST SURGERY  1968   Fibroadenoma  . CARDIOVASCULAR STRESS TEST  02/07/2002  . [place]  . CHOLECYSTECTOMY  2005   Dr Helane Rima, [place]  . COLONOSCOPY  04-22-02   Dr Ricarda Frame, [place]  . COLONOSCOPY  09-02-12   Dr Bary Castilla  . Endometrial ablation and right ovary removal  2002  . MRI, hip  01/12/2004  . MRI, neck  06/23/1998  10/31/2003  . OOPHORECTOMY Bilateral 15+ YRS AGO  . Removal of excess eyelids and shortening of eyelid tendons  01/2010  . 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 Right hip injection  03/30/2009  .  SKIN SURGERY  10/28/2019   removal of basal cell  . Touch Up of excess eyelids  04/20/2010    reports that she quit smoking about 51 years ago. Her smoking use included cigarettes. She has never used smokeless tobacco. She reports previous alcohol use. She reports that she does not use drugs. family history includes COPD in her mother; Colon cancer in an other family member; Heart disease in her father; Stroke (age of onset: 88) in her maternal grandfather. Allergies  Allergen Reactions  . Codeine     REACTION: Felt like she was going to "pass out"      Outpatient Encounter Medications as of 01/12/2021  Medication Sig  . atorvastatin (LIPITOR) 20 MG tablet TAKE 1 TABLET BY MOUTH EVERY DAY  . Calcium  Carbonate-Vitamin D 600-400 MG-UNIT tablet Take 1 tablet by mouth 2 (two) times daily.  . carisoprodol (SOMA) 350 MG tablet TAKE 1 TABLET BY MOUTH EVERYDAY AT BEDTIME  . clobetasol ointment (TEMOVATE) 2.77 % Apply 1 application topically every other day.  . diclofenac Sodium (VOLTAREN) 1 % GEL APPLY 2 GRAMS TOPICALLY 4 (FOUR) TIMES DAILY AS NEEDED.  Marland Kitchen letrozole (FEMARA) 2.5 MG tablet TAKE 1 TABLET (2.5 MG TOTAL) BY MOUTH DAILY.  Marland Kitchen loratadine (CLARITIN) 10 MG tablet Take 10 mg by mouth daily.  . meloxicam (MOBIC) 7.5 MG tablet Take 7.5 mg by mouth 2 (two) times daily.  Marland Kitchen omeprazole (PRILOSEC) 20 MG capsule TAKE 1 CAPSULE BY MOUTH EVERY DAY  . polyethylene glycol powder (GLYCOLAX/MIRALAX) powder TAKE 1 CAPFUL(17G) BY MOUTH DAILY  . Propylene Glycol (SYSTANE BALANCE OP) Place 1 drop into both eyes daily.  . traMADol-acetaminophen (ULTRACET) 37.5-325 MG tablet TAKE 2 TABLETS BY MOUTH AT BEDTIME  . traZODone (DESYREL) 100 MG tablet TAKE 1.5 TABLETS BY MOUTH AT BEDTIME  . triamterene-hydrochlorothiazide (DYAZIDE) 37.5-25 MG capsule TAKE 1 CAPSULE BY MOUTH EVERY DAY IN THE MORNING  . vitamin E 400 UNIT capsule Take 400 Units by mouth daily.  . [DISCONTINUED] nitrofurantoin, macrocrystal-monohydrate, (MACROBID) 100 MG capsule Take 1 capsule by mouth in the morning and at bedtime.   No facility-administered encounter medications on file as of 01/12/2021.     REVIEW OF SYSTEMS  : All other systems reviewed and negative except where noted in the History of Present Illness.   PHYSICAL EXAM: BP 119/60   Pulse 79   Ht 5' 4"  (1.626 m)   Wt 164 lb 3.2 oz (74.5 kg)   SpO2 97%   BMI 28.18 kg/m  General: Well developed white female in no acute distress Head: Normocephalic and atraumatic Eyes:  Sclerae anicteric, conjunctiva pink. Ears: Normal auditory acuity Lungs: Clear throughout to auscultation; no W/R/R. Heart: Regular rate and rhythm; no M/R/G. Abdomen: Soft, non-distended.  BS present.   Non-tender. Musculoskeletal: Symmetrical with no gross deformities  Skin: No lesions on visible extremities Extremities: No edema  Neurological: Alert oriented x 4, grossly non-focal Psychological:  Alert and cooperative. Normal mood and affect  ASSESSMENT AND PLAN: *GERD: Well-controlled on omeprazole 20 mg daily.  We will send prescription refills for 90-day supply. *Chronic constipation: Uses MiraLAX daily, sometimes feels like it is too much.  We discussed possibly dialing back to a half dose daily versus adding in 2 teaspoons of Benefiber powder each day to help with some bulk. *Gas/bloating: We discussed trying a daily probiotic.  She says her husband takes Electronics engineer.  She will consider this. *Colorectal cancer screening: Her next colonoscopy would be October 2023.  We will  revisit at that time.   CC:  Tonia Ghent, MD

## 2021-01-14 ENCOUNTER — Other Ambulatory Visit: Payer: Self-pay | Admitting: Family Medicine

## 2021-01-14 NOTE — Telephone Encounter (Signed)
Refill request for Soma 350 mg tablets  LOV - 12/13/2019 Next OV - 01/25/2021 Last refill - 08/10/20 #90/1

## 2021-01-15 NOTE — Telephone Encounter (Signed)
Sent. Thanks.   

## 2021-01-15 NOTE — Progress Notes (Signed)
Addendum: Reviewed and agree with assessment and management plan. Nanako Stopher M, MD  

## 2021-01-18 ENCOUNTER — Other Ambulatory Visit: Payer: Self-pay

## 2021-01-18 ENCOUNTER — Other Ambulatory Visit (INDEPENDENT_AMBULATORY_CARE_PROVIDER_SITE_OTHER): Payer: Medicare Other

## 2021-01-18 DIAGNOSIS — M8580 Other specified disorders of bone density and structure, unspecified site: Secondary | ICD-10-CM

## 2021-01-18 DIAGNOSIS — E559 Vitamin D deficiency, unspecified: Secondary | ICD-10-CM

## 2021-01-18 DIAGNOSIS — D509 Iron deficiency anemia, unspecified: Secondary | ICD-10-CM

## 2021-01-18 DIAGNOSIS — E785 Hyperlipidemia, unspecified: Secondary | ICD-10-CM

## 2021-01-18 LAB — CBC WITH DIFFERENTIAL/PLATELET
Basophils Absolute: 0.1 10*3/uL (ref 0.0–0.1)
Basophils Relative: 1 % (ref 0.0–3.0)
Eosinophils Absolute: 0.2 10*3/uL (ref 0.0–0.7)
Eosinophils Relative: 2.9 % (ref 0.0–5.0)
HCT: 44.4 % (ref 36.0–46.0)
Hemoglobin: 15 g/dL (ref 12.0–15.0)
Lymphocytes Relative: 24.5 % (ref 12.0–46.0)
Lymphs Abs: 1.5 10*3/uL (ref 0.7–4.0)
MCHC: 33.8 g/dL (ref 30.0–36.0)
MCV: 92 fl (ref 78.0–100.0)
Monocytes Absolute: 0.7 10*3/uL (ref 0.1–1.0)
Monocytes Relative: 11 % (ref 3.0–12.0)
Neutro Abs: 3.7 10*3/uL (ref 1.4–7.7)
Neutrophils Relative %: 60.6 % (ref 43.0–77.0)
Platelets: 162 10*3/uL (ref 150.0–400.0)
RBC: 4.82 Mil/uL (ref 3.87–5.11)
RDW: 12.6 % (ref 11.5–15.5)
WBC: 6.1 10*3/uL (ref 4.0–10.5)

## 2021-01-18 LAB — IRON: Iron: 102 ug/dL (ref 42–145)

## 2021-01-18 LAB — COMPREHENSIVE METABOLIC PANEL
ALT: 8 U/L (ref 0–35)
AST: 15 U/L (ref 0–37)
Albumin: 4 g/dL (ref 3.5–5.2)
Alkaline Phosphatase: 63 U/L (ref 39–117)
BUN: 21 mg/dL (ref 6–23)
CO2: 31 mEq/L (ref 19–32)
Calcium: 10.2 mg/dL (ref 8.4–10.5)
Chloride: 103 mEq/L (ref 96–112)
Creatinine, Ser: 0.86 mg/dL (ref 0.40–1.20)
GFR: 65.12 mL/min (ref >60.00)
Glucose, Bld: 85 mg/dL (ref 70–99)
Potassium: 3.9 mEq/L (ref 3.5–5.1)
Sodium: 140 mEq/L (ref 135–145)
Total Bilirubin: 0.7 mg/dL (ref 0.2–1.2)
Total Protein: 6.7 g/dL (ref 6.0–8.3)

## 2021-01-18 LAB — LIPID PANEL
Cholesterol: 201 mg/dL — ABNORMAL HIGH (ref 0–200)
HDL: 42.3 mg/dL (ref >39.00)
LDL Cholesterol: 119 mg/dL — ABNORMAL HIGH (ref 0–99)
NonHDL: 159.1
Total CHOL/HDL Ratio: 5
Triglycerides: 200 mg/dL — ABNORMAL HIGH (ref 0.0–149.0)
VLDL: 40 mg/dL (ref 0.0–40.0)

## 2021-01-18 LAB — VITAMIN D 25 HYDROXY (VIT D DEFICIENCY, FRACTURES): VITD: 41.58 ng/mL (ref 30.00–100.00)

## 2021-01-25 ENCOUNTER — Ambulatory Visit (INDEPENDENT_AMBULATORY_CARE_PROVIDER_SITE_OTHER): Payer: Medicare Other | Admitting: Family Medicine

## 2021-01-25 ENCOUNTER — Encounter: Payer: Self-pay | Admitting: Family Medicine

## 2021-01-25 ENCOUNTER — Other Ambulatory Visit: Payer: Self-pay

## 2021-01-25 VITALS — BP 126/78 | HR 85 | Temp 97.3°F | Ht 64.0 in | Wt 164.0 lb

## 2021-01-25 DIAGNOSIS — E785 Hyperlipidemia, unspecified: Secondary | ICD-10-CM | POA: Diagnosis not present

## 2021-01-25 DIAGNOSIS — R7989 Other specified abnormal findings of blood chemistry: Secondary | ICD-10-CM

## 2021-01-25 DIAGNOSIS — G4700 Insomnia, unspecified: Secondary | ICD-10-CM

## 2021-01-25 DIAGNOSIS — Z0000 Encounter for general adult medical examination without abnormal findings: Secondary | ICD-10-CM

## 2021-01-25 DIAGNOSIS — I10 Essential (primary) hypertension: Secondary | ICD-10-CM | POA: Diagnosis not present

## 2021-01-25 DIAGNOSIS — D0511 Intraductal carcinoma in situ of right breast: Secondary | ICD-10-CM

## 2021-01-25 DIAGNOSIS — Z7189 Other specified counseling: Secondary | ICD-10-CM

## 2021-01-25 MED ORDER — TRAMADOL-ACETAMINOPHEN 37.5-325 MG PO TABS: 2.0000 | ORAL_TABLET | Freq: Every day | ORAL | 1 refills | Status: DC

## 2021-01-25 NOTE — Patient Instructions (Signed)
Stop atorvastatin for about 2 weeks. If clearly having less aches, let me know.  If not, then restart.   Take care.  Glad to see you.

## 2021-01-25 NOTE — Progress Notes (Signed)
This visit occurred during the SARS-CoV-2 public health emergency.  Safety protocols were in place, including screening questions prior to the visit, additional usage of staff PPE, and extensive cleaning of exam room while observing appropriate contact time as indicated for disinfecting solutions.  I have personally reviewed the Medicare Annual Wellness questionnaire and have noted 1. The patient's medical and social history 2. Their use of alcohol, tobacco or illicit drugs 3. Their current medications and supplements 4. The patient's functional ability including ADL's, fall risks, home safety risks and hearing or visual             impairment. 5. Diet and physical activities 6. Evidence for depression or mood disorders  The patients weight, height, BMI have been recorded in the chart and visual acuity is per eye clinic.  I have made referrals, counseling and provided education to the patient based review of the above and I have provided the pt with a written personalized care plan for preventive services.  Provider list updated- see scanned forms.  Routine anticipatory guidance given to patient.  See health maintenance. The possibility exists that previously documented standard health maintenance information may have been brought forward from a previous encounter into this note.  If needed, that same information has been updated to reflect the current situation based on today's encounter.    Flu up-to-date Shingles up-to-date PNA up-to-date Tetanus discussed with patient, done in 2011 Covid vaccine up-to-date. Colon cancer screening done with colonoscopy 2013 Breast cancer screening 2021 Bone density test 2020 Advance directive-husband designated patient were incapacitated. Cognitive function addressed- see scanned forms- and if abnormal then additional documentation follows.   Elevated Cholesterol: Using medications without problems: yes Muscle aches: Some, d/w pt.  D/w pt about trial  off statin for about 2 weeks.   Diet compliance: yes Exercise: yes  Labs discussed with patient.  CBC normal and iron normal.  Hypertension:    Using medication without problems or lightheadedness: yes Chest pain with exertion:no Edema:no Short of breath:no  Insomnia on trazodone.  Back pain, improved with ultracet and soma.  Sleeping better with meds.   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  Compliant.  She'll finish 5 years of tx this year.  D/w pt.    Her husband is chronically ill, d/w pt.  We talked about social stressors, esp recent war in [place].  Still active at baseline, still seeing friends and doing puzzles.    PMH and SH reviewed  Meds, vitals, and allergies reviewed.   ROS: Per HPI.  Unless specifically indicated otherwise in HPI, the patient denies:  General: fever. Eyes: acute vision changes ENT: sore throat Cardiovascular: chest pain Respiratory: SOB GI: vomiting GU: dysuria Musculoskeletal: acute back pain Derm: acute rash Neuro: acute motor dysfunction Psych: worsening mood Endocrine: polydipsia Heme: bleeding Allergy: hayfever  GEN: nad, alert and oriented HEENT: ncat NECK: supple w/o LA CV: rrr. PULM: ctab, no inc wob ABD: soft, +bs EXT: no edema SKIN: no acute rash

## 2021-01-27 NOTE — Assessment & Plan Note (Signed)
History of.  Now resolved. Labs discussed with patient.  CBC normal and iron normal.

## 2021-01-27 NOTE — Assessment & Plan Note (Signed)
Muscle aches: Some, d/w pt.  D/w pt about trial off atorvastatin for about 2 weeks.    If better off medication, then she will update me.  If no better, then restart.

## 2021-01-27 NOTE — Assessment & Plan Note (Signed)
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  Compliant.  She'll finish 5 years of tx this year.  D/w pt.

## 2021-01-27 NOTE — Assessment & Plan Note (Signed)
Stressors discussed with patient.  Sleeping better with combination of trazodone and treatment for back pain.  Would continue as is.

## 2021-01-27 NOTE — Assessment & Plan Note (Signed)
Labs discussed with patient.  Continue triamterene hydrochlorothiazide.  She will update me as needed.

## 2021-01-27 NOTE — Assessment & Plan Note (Signed)
Advance directive-husband designated patient were incapacitated 

## 2021-01-27 NOTE — Assessment & Plan Note (Signed)
Flu up-to-date Shingles up-to-date PNA up-to-date Tetanus discussed with patient, done in 2011 Covid vaccine up-to-date. Colon cancer screening done with colonoscopy 2013 Breast cancer screening 2021 Bone density test 2020 Advance directive-husband designated patient were incapacitated. Cognitive function addressed- see scanned forms- and if abnormal then additional documentation follows.

## 2021-02-07 ENCOUNTER — Encounter: Payer: Self-pay | Admitting: Family Medicine

## 2021-02-23 ENCOUNTER — Other Ambulatory Visit: Payer: Self-pay

## 2021-02-23 ENCOUNTER — Ambulatory Visit: Payer: Medicare Other

## 2021-02-25 ENCOUNTER — Other Ambulatory Visit: Payer: Self-pay | Admitting: Family Medicine

## 2021-02-26 ENCOUNTER — Other Ambulatory Visit: Payer: Self-pay | Admitting: Family Medicine

## 2021-03-04 ENCOUNTER — Other Ambulatory Visit: Payer: Self-pay | Admitting: Family Medicine

## 2021-05-12 ENCOUNTER — Ambulatory Visit (INDEPENDENT_AMBULATORY_CARE_PROVIDER_SITE_OTHER): Payer: Medicare Other | Admitting: Obstetrics and Gynecology

## 2021-05-12 ENCOUNTER — Other Ambulatory Visit: Payer: Self-pay

## 2021-05-12 ENCOUNTER — Encounter: Payer: Self-pay | Admitting: Obstetrics and Gynecology

## 2021-05-12 VITALS — BP 135/75 | HR 77 | Ht 64.0 in | Wt 161.6 lb

## 2021-05-12 DIAGNOSIS — L900 Lichen sclerosus et atrophicus: Secondary | ICD-10-CM

## 2021-05-12 NOTE — Progress Notes (Signed)
HPI:      [name] is a 81 y.o. G2P2 who LMP was No LMP recorded. Patient is postmenopausal.  Subjective:   She presents today for her annual GYN examination.  Her most pressing GYN issue at this time is that she has lichen sclerosus which she is intermittently treating with clobetasol ointment.  She says that she gets relief when using the clobetasol ointment but is not always as good with keeping up/maintaining use of the ointment. Of significant note patient has a history of breast cancer and next month will be a 5-year survivor.  She is currently on letrozole but will be discontinuing this 1 month from now.  She is followed closely by Dr. Bary Castilla.    Hx: The following portions of the patient's history were reviewed and updated as appropriate:             She  has a past medical history of Arthritis (2011), Basal cell carcinoma (05/04/2010), Basal cell carcinoma (01/22/2014), Basal cell carcinoma (10/28/2019), Breast cancer ([place]) (04/28/2016), GERD (gastroesophageal reflux disease), History of chickenpox, Hyperlipidemia, Hypertension, Lichen planus, Osteopenia, Personal history of radiation therapy (2017), Skin cancer, Squamous cell carcinoma of skin (03/29/2010), and Squamous cell carcinoma of skin (12/30/2013). She does not have any pertinent problems on file. She  has a past surgical history that includes MRI, neck (06/23/1998  10/31/2003); Cardiovascular stress test (02/07/2002); MRI, hip (01/12/2004); Cesarean section (1972 & 1975); Endometrial ablation and right ovary removal (2002); Right hip injection (03/30/2009); Removal of excess eyelids and shortening of eyelid tendons (01/2010); Removal of squamous and basal cell carcinoma from nose (04/2010); Touch Up of excess eyelids (04/20/2010); Oophorectomy (Bilateral, 15+ YRS AGO); Colonoscopy (04-22-02); Cholecystectomy (2005); Colonoscopy (09-02-12); Excision basal cell carcinoma (01-22-14); Breast surgery (1968); Breast cyst aspiration  (Bilateral); Breast lumpectomy with sentinel lymph node bx (Right, 06/10/2016); Breast lumpectomy with needle localization (Right, 06/10/2016); Skin surgery (10/28/2019); Breast excisional biopsy (Right, 1968); Breast biopsy (Right, 2013); Breast biopsy (Right, 04-28-16); and Breast lumpectomy (Right, 2017). Her family history includes COPD in her mother; Colon cancer in an other family member; Heart disease in her father; Stroke (age of onset: 93) in her maternal grandfather. She  reports that she quit smoking about 51 years ago. Her smoking use included cigarettes. She has never used smokeless tobacco. She reports previous alcohol use. She reports that she does not use drugs. She has a current medication list which includes the following prescription(s): atorvastatin, calcium carbonate-vitamin d, carisoprodol, clobetasol ointment, diclofenac sodium, letrozole, loratadine, meloxicam, omeprazole, polyethylene glycol powder, propylene glycol, tramadol-acetaminophen, trazodone, triamterene-hydrochlorothiazide, and vitamin e. She is allergic to codeine.       Review of Systems:  Review of Systems  Constitutional: Denied constitutional symptoms, night sweats, recent illness, fatigue, fever, insomnia and weight loss.  Eyes: Denied eye symptoms, eye pain, photophobia, vision change and visual disturbance.  Ears/Nose/Throat/Neck: Denied ear, nose, throat or neck symptoms, hearing loss, nasal discharge, sinus congestion and sore throat.  Cardiovascular: Denied cardiovascular symptoms, arrhythmia, chest pain/pressure, edema, exercise intolerance, orthopnea and palpitations.  Respiratory: Denied pulmonary symptoms, asthma, pleuritic pain, productive sputum, cough, dyspnea and wheezing.  Gastrointestinal: Denied, gastro-esophageal reflux, melena, nausea and vomiting.  Genitourinary: Denied genitourinary symptoms including symptomatic vaginal discharge, pelvic relaxation issues, and urinary complaints.   Musculoskeletal: Denied musculoskeletal symptoms, stiffness, swelling, muscle weakness and myalgia.  Dermatologic: Denied dermatology symptoms, rash and scar.  Neurologic: Denied neurology symptoms, dizziness, headache, neck pain and syncope.  Psychiatric: Denied psychiatric symptoms, anxiety and depression.  Endocrine: Denied endocrine symptoms including hot flashes and night sweats.   Meds:   Current Outpatient Medications on File Prior to Visit  Medication Sig Dispense Refill   atorvastatin (LIPITOR) 20 MG tablet TAKE 1 TABLET BY MOUTH EVERY DAY 90 tablet 3   Calcium Carbonate-Vitamin D 600-400 MG-UNIT tablet Take 1 tablet by mouth 2 (two) times daily.     carisoprodol (SOMA) 350 MG tablet TAKE 1 TABLET BY MOUTH EVERYDAY AT BEDTIME 90 tablet 1   clobetasol ointment (TEMOVATE) 9.37 % Apply 1 application topically every other day. 30 g 2   diclofenac Sodium (VOLTAREN) 1 % GEL APPLY 2 GRAMS TOPICALLY 4 (FOUR) TIMES DAILY AS NEEDED. 100 g 5   letrozole (FEMARA) 2.5 MG tablet TAKE 1 TABLET (2.5 MG TOTAL) BY MOUTH DAILY. 90 tablet 3   loratadine (CLARITIN) 10 MG tablet Take 10 mg by mouth daily.     meloxicam (MOBIC) 7.5 MG tablet Take 7.5 mg by mouth 2 (two) times daily.     omeprazole (PRILOSEC) 20 MG capsule TAKE 1 CAPSULE BY MOUTH EVERY DAY 90 capsule 3   polyethylene glycol powder (GLYCOLAX/MIRALAX) powder TAKE 1 CAPFUL(17G) BY MOUTH DAILY 1530 g 3   Propylene Glycol (SYSTANE BALANCE OP) Place 1 drop into both eyes daily.     traMADol-acetaminophen (ULTRACET) 37.5-325 MG tablet Take 2 tablets by mouth at bedtime. 180 tablet 1   traZODone (DESYREL) 100 MG tablet TAKE 1 AND 1/2 TABLETS BY MOUTH AT BEDTIME 135 tablet 0   triamterene-hydrochlorothiazide (DYAZIDE) 37.5-25 MG capsule TAKE 1 CAPSULE BY MOUTH EVERY DAY IN THE MORNING 90 capsule 3   vitamin E 400 UNIT capsule Take 400 Units by mouth daily.     No current facility-administered medications on file prior to visit.           Objective:     Vitals:   05/12/21 1340  BP: 135/75  Pulse: 77    Filed Weights   05/12/21 1340  Weight: 161 lb 9.6 oz (73.3 kg)              Physical examination General NAD, Conversant  HEENT Atraumatic; Op clear with mmm.  Normo-cephalic. Pupils reactive. Anicteric sclerae  Thyroid/Neck Smooth without nodularity or enlargement. Normal ROM.  Neck Supple.  Skin No rashes, lesions or ulceration. Normal palpated skin turgor. No nodularity.  Breasts: No masses or discharge.  Symmetric.  No axillary adenopathy.  Lungs: Clear to auscultation.No rales or wheezes. Normal Respiratory effort, no retractions.  Heart: NSR.  No murmurs or rubs appreciated. No periferal edema  Abdomen: Soft.  Non-tender.  No masses.  No HSM. No hernia  Extremities: Moves all appropriately.  Normal ROM for age. No lymphadenopathy.  Neuro: Oriented to PPT.  Normal mood. Normal affect.     Pelvic:   Vulva: Obvious lichen sclerosus especially around the clitoral area and posterior fourchette.  Vagina: No lesions or abnormalities noted.  Vaginal atrophy noted.  Support: Normal pelvic support.  Urethra No masses tenderness or scarring.  Meatus Normal size without lesions or prolapse.  Cervix: Normal appearance.  No lesions.  Anus: Normal exam.  No lesions.  Perineum: Normal exam.  No lesions.        Bimanual   Uterus: Normal size.  Non-tender.  Mobile.  AV.  Adnexae: No masses.  Non-tender to palpation.  Cul-de-sac: Negative for abnormality.     Assessment:    G2P2 Patient Active Problem List   Diagnosis Date Noted   Chronic constipation 01/12/2021  Urinary urgency 09/01/2019   Trochanteric bursitis of right hip 04/09/2019   Healthcare maintenance 12/07/2018   Syncope 10/13/2017   Ductal carcinoma in situ (DCIS) of right breast 03/15/2017   Vaginal atrophy 03/15/2017   Malignant neoplasm of upper-outer quadrant of right female breast ([place]) 05/03/2016   Iron deficiency anemia 12/23/2013    Abnormal CBC 76/19/5093   Lichen planus 26/71/2458   Medicare annual wellness visit, subsequent 08/13/2012   GERD (gastroesophageal reflux disease) 08/11/2011   Seasonal allergies 08/11/2011   Advance care planning 08/11/2011   Osteopenia 08/11/2011   Insomnia 01/11/2011   Hyperlipidemia 07/07/2010   Essential hypertension 07/07/2010   Osteoarthritis 07/07/2010   SKIN CANCER, HX OF 07/07/2010     1. Lichen sclerosus     She is generally doing well and comfortable using clobetasol ointment.  She has a generally normal GYN exam for a menopausal woman.  Plan:            1.  Basic Screening Recommendations The basic screening recommendations for asymptomatic women were discussed with the patient during her visit.  The age-appropriate recommendations were discussed with her and the rational for the tests reviewed.  When I am informed by the patient that another primary care physician has previously obtained the age-appropriate tests and they are up-to-date, only outstanding tests are ordered and referrals given as necessary.  Abnormal results of tests will be discussed with her when all of her results are completed.  Routine preventative health maintenance measures emphasized: Exercise/Diet/Weight control, Tobacco Warnings, Alcohol/Substance use risks and Stress Management Mammography-followed by Dr. Bary Castilla Patient has aged out of Pap smears. Blood work performed by family physician/internist 2.  Lichen sclerosus controlled using clobetasol-advised to use twice-weekly.   Orders No orders of the defined types were placed in this encounter.   No orders of the defined types were placed in this encounter.         F/U  Return in about 1 year (around 05/12/2022) for Annual Physical. I spent 32 minutes involved in the care of this patient preparing to see the patient by obtaining and reviewing her medical history (including labs, imaging tests and prior procedures), documenting clinical  information in the electronic health record (EHR), counseling and coordinating care plans, writing and sending prescriptions, ordering tests or procedures and directly communicating with the patient by discussing pertinent items from her history and physical exam as well as detailing my assessment and plan as noted above so that she has an informed understanding.  All of her questions were answered.  Finis Bud, M.D. 05/12/2021 2:09 PM

## 2021-06-01 ENCOUNTER — Other Ambulatory Visit: Payer: Self-pay | Admitting: Family Medicine

## 2021-06-04 DIAGNOSIS — M25552 Pain in left hip: Secondary | ICD-10-CM | POA: Diagnosis not present

## 2021-06-04 DIAGNOSIS — M546 Pain in thoracic spine: Secondary | ICD-10-CM | POA: Diagnosis not present

## 2021-06-11 ENCOUNTER — Other Ambulatory Visit: Payer: Self-pay | Admitting: General Surgery

## 2021-06-11 ENCOUNTER — Other Ambulatory Visit: Payer: Self-pay | Admitting: Family Medicine

## 2021-06-11 DIAGNOSIS — Z1231 Encounter for screening mammogram for malignant neoplasm of breast: Secondary | ICD-10-CM

## 2021-06-29 DIAGNOSIS — M13851 Other specified arthritis, right hip: Secondary | ICD-10-CM | POA: Diagnosis not present

## 2021-06-29 DIAGNOSIS — M5416 Radiculopathy, lumbar region: Secondary | ICD-10-CM | POA: Diagnosis not present

## 2021-07-05 ENCOUNTER — Ambulatory Visit (INDEPENDENT_AMBULATORY_CARE_PROVIDER_SITE_OTHER): Payer: Medicare Other | Admitting: Dermatology

## 2021-07-05 ENCOUNTER — Other Ambulatory Visit: Payer: Self-pay

## 2021-07-05 DIAGNOSIS — L918 Other hypertrophic disorders of the skin: Secondary | ICD-10-CM

## 2021-07-05 DIAGNOSIS — L578 Other skin changes due to chronic exposure to nonionizing radiation: Secondary | ICD-10-CM

## 2021-07-05 DIAGNOSIS — L738 Other specified follicular disorders: Secondary | ICD-10-CM

## 2021-07-05 DIAGNOSIS — L820 Inflamed seborrheic keratosis: Secondary | ICD-10-CM

## 2021-07-05 DIAGNOSIS — D229 Melanocytic nevi, unspecified: Secondary | ICD-10-CM | POA: Diagnosis not present

## 2021-07-05 DIAGNOSIS — Z1283 Encounter for screening for malignant neoplasm of skin: Secondary | ICD-10-CM | POA: Diagnosis not present

## 2021-07-05 DIAGNOSIS — Z85828 Personal history of other malignant neoplasm of skin: Secondary | ICD-10-CM | POA: Diagnosis not present

## 2021-07-05 DIAGNOSIS — L821 Other seborrheic keratosis: Secondary | ICD-10-CM | POA: Diagnosis not present

## 2021-07-05 DIAGNOSIS — D1800 Hemangioma unspecified site: Secondary | ICD-10-CM | POA: Diagnosis not present

## 2021-07-05 DIAGNOSIS — L814 Other melanin hyperpigmentation: Secondary | ICD-10-CM

## 2021-07-05 NOTE — Patient Instructions (Signed)

## 2021-07-05 NOTE — Progress Notes (Signed)
Follow-Up Visit   Subjective  Alyssa Nolan is a 80 y.o. female who presents for the following: Annual Exam (History of BCC/SCC - TBSE today) and Other (Spot on bilateral cheek that feel rough). The patient presents for Total-Body Skin Exam (TBSE) for skin cancer screening and mole check.  The following portions of the chart were reviewed this encounter and updated as appropriate:   Tobacco  Allergies  Meds  Problems  Med Hx  Surg Hx  Fam Hx     Review of Systems:  No other skin or systemic complaints except as noted in HPI or Assessment and Plan.  Objective  Well appearing patient in no apparent distress; mood and affect are within normal limits.  A full examination was performed including scalp, head, eyes, ears, nose, lips, neck, chest, axillae, abdomen, back, buttocks, bilateral upper extremities, bilateral lower extremities, hands, feet, fingers, toes, fingernails, and toenails. All findings within normal limits unless otherwise noted below.  Scalp x 1, right cheek x 1, left deltoid x 1, right popliteal x 1 (4) Erythematous keratotic or waxy stuck-on papule or plaque.   Head - Anterior (Face) Yellow papules   Assessment & Plan   History of Basal Cell Carcinoma of the Skin - No evidence of recurrence today - Recommend regular full body skin exams - Recommend daily broad spectrum sunscreen SPF 30+ to sun-exposed areas, reapply every 2 hours as needed.  - Call if any new or changing lesions are noted between office visits  History of Squamous Cell Carcinoma of the Skin - No evidence of recurrence today - No lymphadenopathy - Recommend regular full body skin exams - Recommend daily broad spectrum sunscreen SPF 30+ to sun-exposed areas, reapply every 2 hours as needed.  - Call if any new or changing lesions are noted between office visits  Acrochordons (Skin Tags) - Fleshy, skin-colored pedunculated papules - Benign appearing.  - Observe. - If desired, they  can be removed with an in office procedure that is not covered by insurance. - Please call the clinic if you notice any new or changing lesions.  Lentigines - Scattered tan macules - Due to sun exposure - Benign-appering, observe - Recommend daily broad spectrum sunscreen SPF 30+ to sun-exposed areas, reapply every 2 hours as needed. - Call for any changes  Seborrheic Keratoses - Stuck-on, waxy, tan-brown papules and/or plaques  - Benign-appearing - Discussed benign etiology and prognosis. - Observe - Call for any changes  Melanocytic Nevi - Tan-brown and/or pink-flesh-colored symmetric macules and papules - Benign appearing on exam today - Observation - Call clinic for new or changing moles - Recommend daily use of broad spectrum spf 30+ sunscreen to sun-exposed areas.   Hemangiomas - Red papules - Discussed benign nature - Observe - Call for any changes  Actinic Damage - Chronic condition, secondary to cumulative UV/sun exposure - diffuse scaly erythematous macules with underlying dyspigmentation - Recommend daily broad spectrum sunscreen SPF 30+ to sun-exposed areas, reapply every 2 hours as needed.  - Staying in the shade or wearing long sleeves, sun glasses (UVA+UVB protection) and wide brim hats (4-inch brim around the entire circumference of the hat) are also recommended for sun protection.  - Call for new or changing lesions.  Skin cancer screening performed today.  Inflamed seborrheic keratosis Scalp x 1, right cheek x 1, left deltoid x 1, right popliteal x 1  Destruction of lesion - Scalp x 1, right cheek x 1, left deltoid x 1, right popliteal  x 1 Complexity: simple   Destruction method: cryotherapy   Informed consent: discussed and consent obtained   Timeout:  patient name, date of birth, surgical site, and procedure verified Lesion destroyed using liquid nitrogen: Yes   Region frozen until ice ball extended beyond lesion: Yes   Outcome: patient tolerated  procedure well with no complications   Post-procedure details: wound care instructions given    Sebaceous hyperplasia Head - Anterior (Face) Benign-appearing.  Observation.  Call clinic for new or changing lesions.  Recommend daily use of broad spectrum spf 30+ sunscreen to sun-exposed areas.   Skin cancer screening  Return in about 1 year (around 07/05/2022) for TBSE.  I, Ashok Cordia, CMA, am acting as scribe for Sarina Ser, MD . Documentation: I have reviewed the above documentation for accuracy and completeness, and I agree with the above.  Sarina Ser, MD

## 2021-07-06 ENCOUNTER — Encounter: Payer: Self-pay | Admitting: Dermatology

## 2021-07-09 ENCOUNTER — Ambulatory Visit
Admission: RE | Admit: 2021-07-09 | Discharge: 2021-07-09 | Disposition: A | Discharge status: Home / Self Care | Payer: Medicare Other | Source: Ambulatory Visit | Attending: Family Medicine | Admitting: Family Medicine

## 2021-07-09 ENCOUNTER — Other Ambulatory Visit: Payer: Self-pay

## 2021-07-09 DIAGNOSIS — Z1231 Encounter for screening mammogram for malignant neoplasm of breast: Secondary | ICD-10-CM | POA: Insufficient documentation

## 2021-07-13 ENCOUNTER — Other Ambulatory Visit: Payer: Self-pay | Admitting: Family Medicine

## 2021-07-13 NOTE — Telephone Encounter (Signed)
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  Back pain, improved with ultracet and soma.  Sleeping better with meds. " Last refill 01/15/21 #90/1

## 2021-07-13 NOTE — Telephone Encounter (Signed)
Forwarding to PCP.

## 2021-07-14 NOTE — Telephone Encounter (Signed)
Sent. Thanks.   

## 2021-07-16 DIAGNOSIS — M5416 Radiculopathy, lumbar region: Secondary | ICD-10-CM | POA: Insufficient documentation

## 2021-07-29 ENCOUNTER — Other Ambulatory Visit: Payer: Self-pay | Admitting: Family Medicine

## 2021-07-29 NOTE — Telephone Encounter (Signed)
Last OV - [id number] Next OV - N/A Last Filled -[id number]

## 2021-07-30 NOTE — Telephone Encounter (Signed)
Sent. Thanks.   

## 2021-08-03 DIAGNOSIS — M5416 Radiculopathy, lumbar region: Secondary | ICD-10-CM | POA: Diagnosis not present

## 2021-08-06 ENCOUNTER — Encounter: Payer: Self-pay | Admitting: Family Medicine

## 2021-08-16 DIAGNOSIS — I700 Atherosclerosis of aorta: Secondary | ICD-10-CM | POA: Insufficient documentation

## 2021-08-16 DIAGNOSIS — M545 Low back pain, unspecified: Secondary | ICD-10-CM | POA: Insufficient documentation

## 2021-08-16 DIAGNOSIS — M5459 Other low back pain: Secondary | ICD-10-CM | POA: Diagnosis not present

## 2021-08-17 ENCOUNTER — Other Ambulatory Visit: Payer: Self-pay | Admitting: General Surgery

## 2021-08-17 DIAGNOSIS — C50411 Malignant neoplasm of upper-outer quadrant of right female breast: Secondary | ICD-10-CM | POA: Diagnosis not present

## 2021-08-17 DIAGNOSIS — Z170 Estrogen receptor positive status [ER+]: Secondary | ICD-10-CM | POA: Diagnosis not present

## 2021-08-17 DIAGNOSIS — Z79811 Long term (current) use of aromatase inhibitors: Secondary | ICD-10-CM

## 2021-08-17 NOTE — Addendum Note (Signed)
Addended by: Robert Bellow on: 08/17/2021 08:48 PM   Modules accepted: Orders

## 2021-08-25 ENCOUNTER — Other Ambulatory Visit: Payer: Self-pay | Admitting: Family Medicine

## 2021-08-29 DIAGNOSIS — M5459 Other low back pain: Secondary | ICD-10-CM | POA: Diagnosis not present

## 2021-09-03 ENCOUNTER — Encounter: Payer: Self-pay | Admitting: Family Medicine

## 2021-09-07 DIAGNOSIS — M5416 Radiculopathy, lumbar region: Secondary | ICD-10-CM | POA: Diagnosis not present

## 2021-09-07 DIAGNOSIS — M5136 Other intervertebral disc degeneration, lumbar region: Secondary | ICD-10-CM | POA: Insufficient documentation

## 2021-09-07 DIAGNOSIS — M1611 Unilateral primary osteoarthritis, right hip: Secondary | ICD-10-CM | POA: Diagnosis not present

## 2021-10-04 ENCOUNTER — Ambulatory Visit
Admission: RE | Admit: 2021-10-04 | Discharge: 2021-10-04 | Disposition: A | Discharge status: Home / Self Care | Payer: Medicare Other | Source: Ambulatory Visit | Attending: General Surgery | Admitting: General Surgery

## 2021-10-04 ENCOUNTER — Other Ambulatory Visit: Payer: Self-pay

## 2021-10-04 DIAGNOSIS — Z853 Personal history of malignant neoplasm of breast: Secondary | ICD-10-CM | POA: Diagnosis not present

## 2021-10-04 DIAGNOSIS — Z923 Personal history of irradiation: Secondary | ICD-10-CM | POA: Diagnosis not present

## 2021-10-04 DIAGNOSIS — M8589 Other specified disorders of bone density and structure, multiple sites: Secondary | ICD-10-CM | POA: Diagnosis not present

## 2021-10-04 DIAGNOSIS — Z79811 Long term (current) use of aromatase inhibitors: Secondary | ICD-10-CM | POA: Insufficient documentation

## 2021-10-04 DIAGNOSIS — Z13820 Encounter for screening for osteoporosis: Secondary | ICD-10-CM | POA: Diagnosis not present

## 2021-10-04 DIAGNOSIS — Z780 Asymptomatic menopausal state: Secondary | ICD-10-CM | POA: Diagnosis not present

## 2021-10-22 DIAGNOSIS — C50411 Malignant neoplasm of upper-outer quadrant of right female breast: Secondary | ICD-10-CM | POA: Diagnosis not present

## 2021-10-22 DIAGNOSIS — Z170 Estrogen receptor positive status [ER+]: Secondary | ICD-10-CM | POA: Diagnosis not present

## 2021-11-07 ENCOUNTER — Encounter: Payer: Self-pay | Admitting: Family Medicine

## 2021-11-16 ENCOUNTER — Encounter: Payer: Self-pay | Admitting: General Surgery

## 2021-11-21 ENCOUNTER — Other Ambulatory Visit: Payer: Self-pay | Admitting: Family Medicine

## 2021-11-22 NOTE — Telephone Encounter (Signed)
Patient will be due for AWV in march; please call to get patient scheduled.

## 2021-11-26 NOTE — Telephone Encounter (Signed)
Called pat and her husband is going to relate the message due to she was not there

## 2021-12-21 ENCOUNTER — Ambulatory Visit (INDEPENDENT_AMBULATORY_CARE_PROVIDER_SITE_OTHER): Payer: Medicare Other | Admitting: Physician Assistant

## 2021-12-21 ENCOUNTER — Encounter: Payer: Self-pay | Admitting: Physician Assistant

## 2021-12-21 VITALS — BP 150/70 | HR 87 | Ht 64.0 in | Wt 162.0 lb

## 2021-12-21 DIAGNOSIS — K5909 Other constipation: Secondary | ICD-10-CM | POA: Diagnosis not present

## 2021-12-21 DIAGNOSIS — K602 Anal fissure, unspecified: Secondary | ICD-10-CM

## 2021-12-21 MED ORDER — AMBULATORY NON FORMULARY MEDICATION: 1 refills | Status: DC

## 2021-12-21 NOTE — Patient Instructions (Addendum)
We have sent a prescription for Diltiazem 2%/Lidocaine 2% gel to [facility] for you. Using your index finger, you should apply a small amount of medication inside the anal opening and to the external anal area three times daily x 6-8 weeks weeks.  [facility]'s information is below: Address: [address], [place], [place] [address]  [phone]  *Please DO NOT go directly from our office to pick up this medication! Give the pharmacy 1 day to process the prescription as this is compounded and takes time to make.  Use over the counter Recti-care as needed. Sitz bath as needed.  How to Take a [employer] A sitz bath is a warm water bath that may be used to care for your rectum, genital area, or the area between your rectum and genitals (perineum). In a sitz bath, the water only comes up to your hips and covers your buttocks. A sitz bath may be done in a bathtub or with a portable sitz bath that fits over the toilet. Your health care provider may recommend a sitz bath to help: Relieve pain and discomfort after delivering a baby. Relieve pain and itching from hemorrhoids or anal fissures. Relieve pain after certain surgeries. Relax muscles that are sore or tight. How to take a sitz bath Take 3-4 sitz baths a day, or as many as told by your health care provider. Bathtub sitz bath To take a sitz bath in a bathtub: Partially fill a bathtub with warm water. The water should be deep enough to cover your hips and buttocks when you are sitting in the tub. Follow your health care provider's instructions if you are told to put medicine in the water. Sit in the water. Open the tub drain a little, and leave it open during your bath. Turn on the warm water again, enough to replace the water that is draining out. Keep the water running throughout your bath. This helps keep the water at the right level and temperature. Soak in the water for 15-20 minutes, or as long as told by your  health care provider. When you are done, be careful when you stand up. You may feel dizzy. After the sitz bath, pat yourself dry. Do not rub your skin to dry it.  Over-the-toilet sitz bath To take a sitz bath with an over-the-toilet basin: Follow the manufacturer's instructions. Fill the basin with warm water. Follow your health care provider's instructions if you were told to put medicine in the water. Sit on the seat. Make sure the water covers your buttocks and perineum. Soak in the water for 15-20 minutes, or as long as told by your health care provider. After the sitz bath, pat yourself dry. Do not rub your skin to dry it. Clean and dry the basin between uses. Discard the basin if it cracks, or according to the manufacturer's instructions.  Contact a health care provider if: Your pain or itching gets worse. Do not continue with sitz baths if your symptoms get worse. You have new symptoms. Do not continue with sitz baths until you talk with your health care provider. Summary A sitz bath is a warm water bath in which the water only comes up to your hips and covers your buttocks. A sitz bath may help relieve pain and discomfort after delivering a baby. It also may help with pain and itching from hemorrhoids or anal fissures, or pain after certain surgeries. It can also help to relax muscles that are sore or  tight. Take 3-4 sitz baths a day, or as many as told by your health care provider. Soak in the water for 15-20 minutes. Do not continue with sitz baths if your symptoms get worse. This information is not intended to replace advice given to you by your health care provider. Make sure you discuss any questions you have with your health care provider. Document Revised: 07/16/2020 Document Reviewed: 07/16/2020 Elsevier Patient Education  2022 [employer].

## 2021-12-21 NOTE — Progress Notes (Signed)
Chief Complaint: Rectal pain  HPI:    Alyssa Nolan is a 81 year old female with a past medical history as listed below, known to Dr. Hilarie Fredrickson, who presents to clinic today with a complaint of rectal pain    01/12/21 patient seen in clinic for constipation.  At that time discussed that she was doing well with reflux controlled on Omeprazole 20 mg daily, she was using MiraLAX 17 g daily for chronic constipation but occasionally had to decrease the dose.  Her last colonoscopy was in October 2013 and repeat was recommended in 10 years.  Her Omeprazole was refilled, continued on MiraLAX daily and tried on a probiotic.    12/16/2021 patient contacted the clinic and described a very inflamed hemorrhoid that was preventing her from having normal bowel movements for 4 days.  She was using Preparation H but it was not helping.  At that time told to increase MiraLAX and do sitz bath's.  The next day she reported things were some better she was scheduled to see me.    Today, the patient explains that about a week ago she started with some issues noticing that she was becoming a little bit constipated regardless of her religious MiraLAX once daily then by Thursday, 12/16/2021 things were just "awful", she had a lot of lower abdominal discomfort and pain at her rectum and the urge to go but only had little bits of stool which she had to "scrape out", she increased her fiber intake with prunes and apricots and fruit and increased her MiraLAX to twice daily and on Friday she had a soft more normal stool but had continued with rectal pain.  Saturday, Sunday and Monday she had a smaller stool but more normal and this morning had a more normal stool.  She has backed down to MiraLAX just once a day.  Does continue with a small amount of rectal pain has been using over-the-counter Preparation H on the outside of her rectum.    Denies fever, chills, weight loss, blood in her stool or symptoms that awaken her from sleep.  Past  Medical History:  Diagnosis Date   Arthritis 2011   R hip injection per Dr. 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 Amalia Hailey with gyn   Personal history of radiation therapy 2017   MammoSite   Skin cancer    BCC, SCC on nose   Squamous cell carcinoma of skin 03/29/2010   left nose ant alar crease/in situ   Squamous cell carcinoma of skin 12/30/2013   left prox zygoma/in situ    Past Surgical History:  Procedure Laterality Date   BASAL CELL CARCINOMA EXCISION  01-22-14   chest area. Dr Nehemiah Massed   BREAST BIOPSY Right 2013   CORE - NEG, cyst with ductal hyperplasia without atypia Dr Bary Castilla   BREAST BIOPSY Right 04-28-16   Invasive ductal carcinoma   BREAST CYST ASPIRATION Bilateral    BREAST EXCISIONAL BIOPSY Right 1968  NEG   BREAST LUMPECTOMY Right 2017   IMC/DCIS, negative LNs   BREAST LUMPECTOMY WITH NEEDLE LOCALIZATION Right 06/10/2016   Procedure: BREAST LUMPECTOMY WITH NEEDLE LOCALIZATION;  Surgeon: Robert Bellow, MD;  Location: [facility] ORS;  Service: General;  Laterality: Right;   BREAST LUMPECTOMY WITH SENTINEL LYMPH NODE BIOPSY Right 06/10/2016   Procedure: BREAST LUMPECTOMY WITH SENTINEL LYMPH NODE BX / WITH RECONSTRUCTION;  Surgeon: Robert Bellow, MD;  Location: [facility] ORS;  Service: General;  Laterality: Right;   [place] TEST  02/07/2002   CESAREAN [place]  2005   Dr Helane Rima, [place]   COLONOSCOPY  04-22-02   Dr Ricarda Frame, [place]  09-02-12   Dr Bary Castilla   Endometrial ablation and right ovary removal  2002   MRI, hip  01/12/2004   MRI, neck  06/23/1998  10/31/2003   OOPHORECTOMY Bilateral 15+ YRS AGO   Removal of excess eyelids and shortening of eyelid tendons  01/2010   Removal of squamous and basal cell carcinoma from nose  04/2010   Right hip injection  03/30/2009   SKIN SURGERY  10/28/2019   removal of basal cell   Touch Up of excess eyelids  04/20/2010    Current Outpatient Medications  Medication Sig Dispense Refill   atorvastatin (LIPITOR) 20 MG tablet TAKE 1 TABLET BY MOUTH EVERY DAY 90 tablet 3   Calcium Carbonate-Vitamin D 600-400 MG-UNIT tablet Take 1 tablet by mouth 2 (two) times daily.     carisoprodol (SOMA) 350 MG tablet TAKE 1 TABLET BY MOUTH EVERYDAY AT BEDTIME 90 tablet 1   clobetasol ointment (TEMOVATE) 4.09 % Apply 1 application topically every other day. 30 g 2   diclofenac Sodium (VOLTAREN) 1 % GEL APPLY 2 GRAMS TOPICALLY 4 (FOUR) TIMES DAILY AS NEEDED. 100 g 5   letrozole (FEMARA) 2.5 MG tablet TAKE 1 TABLET (2.5 MG TOTAL) BY MOUTH DAILY. 90 tablet 3   loratadine (CLARITIN) 10 MG tablet Take 10 mg by mouth daily.     meloxicam (MOBIC) 7.5 MG tablet Take 7.5 mg by mouth 2 (two) times daily.     omeprazole (PRILOSEC) 20 MG capsule TAKE 1 CAPSULE BY MOUTH EVERY DAY 90 capsule 3   polyethylene glycol powder (GLYCOLAX/MIRALAX) powder TAKE 1 CAPFUL(17G) BY MOUTH DAILY 1530 g 3   Propylene Glycol (SYSTANE BALANCE OP) Place 1 drop into both eyes daily.     traMADol-acetaminophen (ULTRACET) 37.5-325 MG tablet TAKE 2 TABLETS BY MOUTH AT BEDTIME 180 tablet 1   traZODone (DESYREL) 100 MG tablet TAKE 1 AND 1/2 TABLETS BY MOUTH AT BEDTIME 135 tablet 0   triamterene-hydrochlorothiazide (DYAZIDE) 37.5-25 MG capsule TAKE 1 CAPSULE BY MOUTH EVERY DAY IN THE MORNING 90 capsule 3   vitamin E 400 UNIT capsule Take 400 Units by mouth daily.     No current facility-administered medications for this visit.     Allergies as of 12/21/2021 - Review Complete 07/06/2021  Allergen Reaction Noted   Codeine      Family History  Problem Relation Age of Onset   COPD Mother        Smoker   Heart disease Father        Possible CHF   Stroke Maternal Grandfather 83       CVA   Colon cancer Other        mother's 1/2 sister   Breast cancer Neg Hx  Diabetes Neg Hx    Liver disease Neg Hx    Pancreatic cancer Neg Hx    Esophageal cancer Neg Hx    Stomach cancer Neg Hx     Social History   Socioeconomic History   Marital status: Married    Spouse name: Not on file   Number of children: 2   Years of education: Not on file   Highest education level: Not on file  Occupational History   Occupation: Retired since 2007 and moved to [employer]    Employer: RETIRED  Tobacco Use   Smoking status: Former    Types: Cigarettes    Quit date: 11/14/1969    Years since quitting: 52.1   Smokeless tobacco: Never  Vaping Use   Vaping Use: Never used  Substance and Sexual Activity   Alcohol use: Not Currently   Drug use: No   Sexual activity: Not Currently    Birth control/protection: Post-menopausal  Other Topics Concern   Not on file  Social History Narrative   From [place]:  [employer] grad   2 kids in [place] cards and likes to [employer]   Social Determinants of Health   Financial Resource Strain: Not on file  Food Insecurity: Not on file  Transportation Needs: Not on file  Physical Activity: Not on file  Stress: Not on file  Social Connections: Not on file  Intimate Partner Violence: Not on file    Review of Systems:    Constitutional: No weight loss, fever or chills Cardiovascular: No chest pain Respiratory: No SOB  Gastrointestinal: See HPI and otherwise negative   Physical Exam:  Vital signs: BP (!) 150/70    Pulse 87    Ht 5' 4"  (1.626 m)    Wt 162 lb (73.5 kg)    SpO2 97%    BMI 27.81 kg/m    Constitutional:   Pleasant Elderly Caucasian female appears to  be in NAD, Well developed, Well nourished, alert and cooperative Respiratory: Respirations even and unlabored. Lungs clear to auscultation bilaterally.   No wheezes, crackles, or rhonchi.  Cardiovascular: Normal S1, S2. No MRG. Regular rate and rhythm. No peripheral edema, cyanosis or pallor.  Gastrointestinal:  Soft, nondistended, nontender. No rebound or guarding. Normal bowel sounds. No appreciable masses or hepatomegaly. Rectal:  External: two hemorrhoid tags, pain to palpation posteriorly, healing anal fissure; internal: deferred due to discomfort Psychiatric: Oriented to person, place and time. Demonstrates good judgement and reason without abnormal affect or behaviors.  No recent labs or imaging.  Assessment: 1.  Anal fissure: Posterior at time of exam today, seems to be healing 2.  Chronic constipation: Typically relieved with MiraLAX daily with occasional breakthroughs  Plan: 1.  Discussed sitz bath's for 15 to 20 minutes 2-3 times a day 2.  Prescribed Diltiazem ointment 3 times daily x6 weeks 3.  Discussed buying over-the-counter RectiCare with lidocaine and applying as needed for pain 4.  Recommend the patient keep a soft regular stool.  Discussed that if she notices she is starting to have a problem then she should just go ahead and increase her MiraLAX, worst-case scenario she may have some diarrhea but hopefully will not have to deal with this again. 5.  Patient is due for a repeat colonoscopy in October, due to age this will need to be discussed at that time.  Currently she is in good health though. 6.  Patient to follow in clinic with [place] as needed.  Ellouise Newer, PA-C [facility] 12/21/2021, 1:29 PM  Cc: Tonia Ghent, MD

## 2021-12-23 NOTE — Progress Notes (Signed)
Addendum: Reviewed and agree with assessment and management plan. Aritzel Krusemark M, MD  

## 2021-12-27 ENCOUNTER — Encounter: Payer: Self-pay | Admitting: Family Medicine

## 2021-12-27 ENCOUNTER — Ambulatory Visit (INDEPENDENT_AMBULATORY_CARE_PROVIDER_SITE_OTHER): Payer: Medicare Other

## 2021-12-27 ENCOUNTER — Ambulatory Visit
Admission: EM | Admit: 2021-12-27 | Discharge: 2021-12-27 | Disposition: A | Discharge status: Home / Self Care | Payer: Medicare Other | Attending: Internal Medicine | Admitting: Internal Medicine

## 2021-12-27 ENCOUNTER — Other Ambulatory Visit: Payer: Self-pay

## 2021-12-27 DIAGNOSIS — U071 COVID-19: Secondary | ICD-10-CM

## 2021-12-27 DIAGNOSIS — R0602 Shortness of breath: Secondary | ICD-10-CM | POA: Diagnosis not present

## 2021-12-27 DIAGNOSIS — R059 Cough, unspecified: Secondary | ICD-10-CM | POA: Diagnosis not present

## 2021-12-27 MED ORDER — ALBUTEROL SULFATE HFA 108 (90 BASE) MCG/ACT IN AERS: 2.0000 | INHALATION_SPRAY | RESPIRATORY_TRACT | 0 refills | Status: DC | PRN

## 2021-12-27 MED ORDER — MOLNUPIRAVIR 200 MG PO CAPS: 4.0000 | ORAL_CAPSULE | Freq: Two times a day (BID) | ORAL | 0 refills | Status: AC

## 2021-12-27 MED ORDER — BENZONATATE 200 MG PO CAPS: 200.0000 mg | ORAL_CAPSULE | Freq: Three times a day (TID) | ORAL | 0 refills | Status: DC | PRN

## 2021-12-27 NOTE — ED Provider Notes (Signed)
[facility]    CSN: [id number] Arrival date & time: 12/27/21  1713      History   Chief Complaint Chief Complaint  Patient presents with   Covid Positive    HPI Alyssa Nolan Theall is Nolan 81 y.o. female who presents with onset of URI symptoms HA, body aches, chills x 3 days Started getting SOB x 1 day She called her PCP and was told to come here to have chest xray  Home covid test positive Saturday  Past Medical History:  Diagnosis Date   Arthritis 2011   R hip injection per Dr. Nelva Bush   Basal cell carcinoma 05/04/2010   ant and sup edge of left nose and nasal alar crease   Basal cell carcinoma 01/22/2014   right chest/excision   Basal cell carcinoma 10/28/2019   right crease nasal ala   Breast cancer ([place]) 04/28/2016   T1c, N0; ER/PR positive, HER-2/neu negative. INVASIVE DUCTAL CARCINOMA., DCIS   GERD (gastroesophageal reflux disease)    History of chickenpox    Hyperlipidemia    Hypertension    Lichen planus    Osteopenia    Tscore -1, consider repeat in ~2013, per Dr. 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   Vaginal atrophy 03/15/2017   Malignant neoplasm of upper-outer quadrant of right female breast ([place]) 05/03/2016   Iron deficiency anemia 12/23/2013   Abnormal CBC 27/01/5008   Lichen planus 38/18/2993   Medicare annual wellness visit, subsequent 08/13/2012   GERD (gastroesophageal reflux disease) 08/11/2011   Seasonal allergies 08/11/2011    Advance care planning 08/11/2011   Osteopenia 08/11/2011   Insomnia 01/11/2011   Hyperlipidemia 07/07/2010   Essential hypertension 07/07/2010   Osteoarthritis 07/07/2010   SKIN CANCER, HX OF 07/07/2010    Past Surgical History:  Procedure Laterality Date   BASAL CELL CARCINOMA EXCISION  01-22-14   chest area. Dr Nehemiah Massed   BREAST BIOPSY Right 2013   CORE - NEG, cyst with ductal hyperplasia without atypia Dr Bary Castilla   BREAST BIOPSY Right 04-28-16   Invasive ductal carcinoma   BREAST CYST ASPIRATION Bilateral    BREAST EXCISIONAL BIOPSY Right 1968   NEG   BREAST LUMPECTOMY Right 2017   IMC/DCIS, negative LNs   BREAST LUMPECTOMY WITH NEEDLE LOCALIZATION Right 06/10/2016   Procedure: BREAST LUMPECTOMY WITH NEEDLE LOCALIZATION;  Surgeon: Robert Bellow, MD;  Location: [facility] ORS;  Service: General;  Laterality: Right;   BREAST LUMPECTOMY WITH SENTINEL LYMPH NODE BIOPSY Right 06/10/2016   Procedure: BREAST LUMPECTOMY WITH SENTINEL LYMPH NODE BX / WITH RECONSTRUCTION;  Surgeon: Robert Bellow, MD;  Location: [facility] OR[facility];  Service: General;  Laterality: Right;   [place] TEST  02/07/2002   CESAREAN [place]  2005   Dr Helane Rima, [place]  04-22-02   Dr Ricarda Frame, [place]  COLONOSCOPY  09-02-12   Dr Bary Castilla   Endometrial ablation and right ovary removal  2002   MRI, hip  01/12/2004   MRI, neck  06/23/1998  10/31/2003   OOPHORECTOMY Bilateral 15+ YRS AGO   Removal of excess eyelids and shortening of eyelid tendons  01/2010   Removal of squamous and basal cell carcinoma from nose  04/2010   Right hip injection  03/30/2009   SKIN SURGERY  10/28/2019   removal of basal cell   Touch Up of excess eyelids  04/20/2010    OB History     Gravida  2   Para  2   Term      Preterm      AB      Living         SAB      IAB      Ectopic      Multiple      Live Births           Obstetric Comments   1st Menstrual Cycle:  14 1st Pregnancy:  30           Home Medications    Prior to Admission medications   Medication Sig Start Date End Date Taking? Authorizing Provider  AMBULATORY NON FORMULARY MEDICATION Medication Name: Diltiazem 2%/Lidocaine 2%   Using your index finger apply Nolan small amount of medication inside the anal opening and to the external anal area three times daily x 6-8 weeks. 12/21/21  Yes Levin Erp, PA  atorvastatin (LIPITOR) 20 MG tablet TAKE 1 TABLET BY MOUTH EVERY DAY 02/25/21  Yes Tonia Ghent, MD  Calcium Carbonate-Vitamin D 600-400 MG-UNIT tablet Take 1 tablet by mouth 2 (two) times daily.   Yes [provider]  carisoprodol (SOMA) 350 MG tablet TAKE 1 TABLET BY MOUTH EVERYDAY AT BEDTIME 07/14/21  Yes Tonia Ghent, MD  clobetasol ointment (TEMOVATE) 1.19 % Apply 1 application topically every other day. 07/15/20  Yes Tonia Ghent, MD  diclofenac Sodium (VOLTAREN) 1 % GEL APPLY 2 GRAMS TOPICALLY 4 (FOUR) TIMES DAILY AS NEEDED. 04/07/20  Yes Tonia Ghent, MD  letrozole [facility]) 2.5 MG tablet TAKE 1 TABLET (2.5 MG TOTAL) BY MOUTH DAILY. 08/23/18  Yes Byrnett, Forest Gleason, MD  loratadine (CLARITIN) 10 MG tablet Take 10 mg by mouth daily.   Yes [provider]  meloxicam (MOBIC) 7.5 MG tablet Take 7.5 mg by mouth 2 (two) times daily.   Yes [provider]  omeprazole (PRILOSEC) 20 MG capsule TAKE 1 CAPSULE BY MOUTH EVERY DAY 01/12/21  Yes Zehr, Janett Billow D, PA-C  polyethylene glycol powder (GLYCOLAX/MIRALAX) powder TAKE 1 CAPFUL(17G) BY MOUTH DAILY 03/10/17  Yes Pyrtle, Lajuan Lines, MD  Propylene Glycol (SYSTANE BALANCE OP) Place 1 drop into both eyes daily.   Yes [provider]  traMADol-acetaminophen (ULTRACET) 37.5-325 MG tablet TAKE 2 TABLETS BY MOUTH AT BEDTIME 07/30/21  Yes Tonia Ghent, MD  traZODone (DESYREL) 100 MG tablet TAKE 1 AND 1/2 TABLETS BY MOUTH AT BEDTIME 11/22/21  Yes Tonia Ghent, MD   triamterene-hydrochlorothiazide (DYAZIDE) 37.5-25 MG capsule TAKE 1 CAPSULE BY MOUTH EVERY DAY IN THE MORNING 03/01/21  Yes Tonia Ghent, MD    Family History Family History  Problem Relation Age of Onset   COPD Mother        Smoker   Heart disease Father        Possible CHF   Stroke Maternal Grandfather 65  CVA   Colon cancer Other        mother's 1/2 sister   Breast cancer Neg Hx    Diabetes Neg Hx    Liver disease Neg Hx    Pancreatic cancer Neg Hx    Esophageal cancer Neg Hx    Stomach cancer Neg Hx     Social History Social History   Tobacco Use   Smoking status: Former    Types: Cigarettes    Quit date: 11/14/1969    Years since quitting: 52.1   Smokeless tobacco: Never  Vaping Use   Vaping Use: Never used  Substance Use Topics   Alcohol use: Not Currently   Drug use: No     Allergies   Codeine   Review of Systems Review of Systems  Constitutional:  Positive for appetite change, chills and fatigue. Negative for diaphoresis and fever.  HENT:  Positive for postnasal drip. Negative for congestion, ear discharge, ear pain, rhinorrhea and sore throat.   Eyes:  Negative for discharge.  Respiratory:  Positive for cough and shortness of breath. Negative for chest tightness.   Cardiovascular:  Negative for chest pain.  Musculoskeletal:  Positive for myalgias.  Neurological:  Positive for headaches.  Hematological:  Negative for adenopathy.    Physical Exam Triage Vital Signs ED Triage Vitals  Enc Vitals Group     BP 12/27/21 1834 137/66     Pulse Rate 12/27/21 1834 87     Resp 12/27/21 1834 16     Temp 12/27/21 1834 99.7 F (37.6 C)     Temp Source 12/27/21 1834 Oral     SpO2 12/27/21 1834 96 %     Weight 12/27/21 1828 160 lb (72.6 kg)     Height 12/27/21 1828 _0  (1.626 m)     Head Circumference --      Peak Flow --      Pain Score 12/27/21 1827 3     Pain Loc --      Pain Edu? --      Excl. in [place]? --    No data found.  Updated  Vital Signs BP 137/66 (BP Location: Left Arm)    Pulse 87    Temp 99.7 F (37.6 C) (Oral)    Resp 16    Ht _1  (1.626 m)    Wt 160 lb (72.6 kg)    SpO2 96%    BMI 27.46 kg/m   Visual Acuity Right Eye Distance:   Left Eye Distance:   Bilateral Distance:    Right Eye Near:   Left Eye Near:    Bilateral Near:     Physical Exam Physical Exam Vitals signs and nursing note reviewed.  Constitutional:      General: She is not in acute distress.    Appearance: Normal appearance. She is not ill-appearing, toxic-appearing or diaphoretic.  HENT:     Head: Normocephalic.     Right Ear: Tympanic membrane, ear canal and external ear normal.     Left Ear: Tympanic membrane, ear canal and external ear normal.     Nose: Nose normal.     Mouth/Throat:     Mouth: Mucous membranes are moist.  Eyes:     General: No scleral icterus.       Right eye: No discharge.        Left eye: No discharge.     Conjunctiva/sclera: Conjunctivae normal.  Neck:     Musculoskeletal: Neck supple. No neck rigidity.  Cardiovascular:     Rate and Rhythm: Normal rate and regular rhythm.     Heart sounds: No murmur.  Pulmonary:     Effort: Pulmonary effort is normal.     Breath sounds: Normal breath sounds.  Musculoskeletal: Normal range of motion.  Lymphadenopathy:     Cervical: No cervical adenopathy.  Skin:    General: Skin is warm and dry.     Coloration: Skin is not jaundiced.     Findings: No rash.  Neurological:     Mental Status: She is alert and oriented to person, place, and time.     Gait: Gait normal.  Psychiatric:        Mood and Affect: Mood normal.        Behavior: Behavior normal.        Thought Content: Thought content normal.        Judgment: Judgment normal.    UC Treatments / Results  Labs (all labs ordered are listed, but only abnormal results are displayed) Labs Reviewed - No data to display  EKG   Radiology No results found.  Procedures Procedures (including critical  care time)  Medications Ordered in UC Medications - No data to display  Initial Impression / Assessment and Plan / UC Course  I have reviewed the triage vital signs and the nursing notes. Pertinent  imaging results that were available during my care of the patient were reviewed by me and considered in my medical decision making (see chart for details). Covid infection She was placed on Molnupiravir, Tessalon and Albuterol inhaler as noted. See instructions    Final Clinical Impressions(s) / UC Diagnoses   Final diagnoses:  None   Discharge Instructions   None    ED Prescriptions   None    PDMP not reviewed this encounter.   Shelby Mattocks, Alyssa 12/27/21 1953

## 2021-12-27 NOTE — Discharge Instructions (Addendum)
Stay quarantined for 5 days from onset of your symptoms. On day 6 you may go out in the public but must wear a mask for 5 more days.   Don't lay around, keep active and walk as much as you are able to to prevent worsening of your symptoms.  Follow up with your family Dr next week.  If you get short of breath and you are able to check  your oxygen with a pulse oxygen meter, if it gets to 92% or less, you need to go to the hospital to be admitted. If you dont have one, come back here and we will assess you.

## 2021-12-27 NOTE — ED Triage Notes (Signed)
Pt reports cough, shortness of breath, funny nose, headache, bodyaches, chills, and fatigue,  Pt reports reach out to PCP by MyChart, they advised no appt available.  Home COVID test positive Saturday.

## 2021-12-27 NOTE — Telephone Encounter (Signed)
I spoke with pt;pt tested + home covid test on 12/25/21. Pts symptoms started on 12/24/21.pt sounded SOB on phone; pt said any exertion gives pt SOB; pt said once sits down takes about 5 mins to recover from SOB. Pt has prod cough but does not know what color phlegm is. Pt hurts both sides of ribs when coughs. Pt said does not have fever or chills but does have body aches. Pt had severe H/A over weekend but none now. Pt does not have loss of taste or smell. Pt has runny nose and S/T in the mornings. Pt will go to [facility] now for eval. Sending note to Dr Damita Dunnings who I spoke with and is aware of this phone note. Sending note also to [facility].

## 2021-12-29 ENCOUNTER — Other Ambulatory Visit: Payer: Self-pay | Admitting: Gastroenterology

## 2022-01-05 ENCOUNTER — Other Ambulatory Visit: Payer: Self-pay

## 2022-01-05 ENCOUNTER — Encounter: Payer: Self-pay | Admitting: Podiatry

## 2022-01-05 ENCOUNTER — Ambulatory Visit (INDEPENDENT_AMBULATORY_CARE_PROVIDER_SITE_OTHER): Payer: Medicare Other

## 2022-01-05 ENCOUNTER — Ambulatory Visit (INDEPENDENT_AMBULATORY_CARE_PROVIDER_SITE_OTHER): Payer: Medicare Other | Admitting: Podiatry

## 2022-01-05 DIAGNOSIS — M7751 Other enthesopathy of right foot: Secondary | ICD-10-CM

## 2022-01-05 DIAGNOSIS — M778 Other enthesopathies, not elsewhere classified: Secondary | ICD-10-CM | POA: Diagnosis not present

## 2022-01-05 MED ORDER — DEXAMETHASONE SODIUM PHOSPHATE 120 MG/30ML IJ SOLN: 2.0000 mg | Freq: Once | INTRAMUSCULAR | Status: DC

## 2022-01-05 NOTE — Progress Notes (Signed)
Subjective:  Patient ID: Alyssa Nolan, female    DOB: 05-Apr-1941,  MRN: [id number] HPI Chief Complaint  Patient presents with   Foot Pain    5th met base bilateral (R>L) - aching x years, right gets red and swollen, injury 50 years ago, has worn orthotics most her life, she may need new ones   New Patient (Initial Visit)    81 y.o. female presents with the above complaint.   ROS: Denies fever chills nausea vomiting muscle aches pains calf pain back pain chest pain shortness of breath.  Past Medical History:  Diagnosis Date   Arthritis 2011   R hip injection per Dr. Nelva Bush   Basal cell carcinoma 05/04/2010   ant and sup edge of left nose and nasal alar crease   Basal cell carcinoma 01/22/2014   right chest/excision   Basal cell carcinoma 10/28/2019   right crease nasal ala   Breast cancer ([place]) 04/28/2016   T1c, N0; ER/PR positive, HER-2/neu negative. INVASIVE DUCTAL CARCINOMA., DCIS   GERD (gastroesophageal reflux disease)    History of chickenpox    Hyperlipidemia    Hypertension    Lichen planus    Osteopenia    Tscore -1, consider repeat in ~2013, per Dr. Amalia Hailey with gyn   Personal history of radiation therapy 2017   MammoSite   Skin cancer    BCC, SCC on nose   Squamous cell carcinoma of skin 03/29/2010   left nose ant alar crease/in situ   Squamous cell carcinoma of skin 12/30/2013   left prox zygoma/in situ   Past Surgical History:  Procedure Laterality Date   BASAL CELL CARCINOMA EXCISION  01-22-14   chest area. Dr Nehemiah Massed   BREAST BIOPSY Right 2013   CORE - NEG, cyst with ductal hyperplasia without atypia Dr Bary Castilla   BREAST BIOPSY Right 04-28-16   Invasive ductal carcinoma   BREAST CYST ASPIRATION Bilateral    BREAST EXCISIONAL BIOPSY Right 1968   NEG   BREAST LUMPECTOMY Right 2017   IMC/DCIS, negative LNs   BREAST LUMPECTOMY WITH NEEDLE LOCALIZATION Right 06/10/2016   Procedure: BREAST LUMPECTOMY WITH NEEDLE LOCALIZATION;  Surgeon: Robert Bellow, MD;  Location: [facility] ORS;  Service: General;  Laterality: Right;   BREAST LUMPECTOMY WITH SENTINEL LYMPH NODE BIOPSY Right 06/10/2016   Procedure: BREAST LUMPECTOMY WITH SENTINEL LYMPH NODE BX / WITH RECONSTRUCTION;  Surgeon: Robert Bellow, MD;  Location: [facility] ORS;  Service: General;  Laterality: Right;   [place] TEST  02/07/2002   CESAREAN [place]  2005   Dr Helane Rima, [place]   COLONOSCOPY  04-22-02   Dr Ricarda Frame, [place]  09-02-12   Dr Bary Castilla   Endometrial ablation and right ovary removal  2002   MRI, hip  01/12/2004   MRI, neck  06/23/1998  10/31/2003   OOPHORECTOMY Bilateral 15+ YRS AGO   Removal of excess eyelids and shortening of eyelid tendons  01/2010   Removal of squamous and basal cell carcinoma from nose  04/2010   Right hip injection  03/30/2009   SKIN SURGERY  10/28/2019   removal of basal cell   Touch Up of excess eyelids  04/20/2010    Current Outpatient Medications:    albuterol (VENTOLIN HFA) 108 (90 Base) MCG/ACT inhaler, Inhale 2 puffs into the lungs every 4 (four) hours as needed for wheezing or shortness of breath. And cough, Disp:  18 g, Rfl: 0   AMBULATORY NON FORMULARY MEDICATION, Medication Name: Diltiazem 2%/Lidocaine 2%   Using your index finger apply a small amount of medication inside the anal opening and to the external anal area three times daily x 6-8 weeks., Disp: 30 g, Rfl: 1   atorvastatin (LIPITOR) 20 MG tablet, TAKE 1 TABLET BY MOUTH EVERY DAY, Disp: 90 tablet, Rfl: 3   benzonatate (TESSALON) 200 MG capsule, Take 1 capsule (200 mg total) by mouth 3 (three) times daily as needed for cough., Disp: 30 capsule, Rfl: 0   Calcium Carbonate-Vitamin D 600-400 MG-UNIT tablet, Take 1 tablet by mouth 2 (two) times daily., Disp: , Rfl:    carisoprodol (SOMA) 350 MG tablet, TAKE 1 TABLET BY MOUTH EVERYDAY AT BEDTIME, Disp: 90 tablet, Rfl: 1   clobetasol ointment (TEMOVATE) 6.72  %, Apply 1 application topically every other day., Disp: 30 g, Rfl: 2   diclofenac Sodium (VOLTAREN) 1 % GEL, APPLY 2 GRAMS TOPICALLY 4 (FOUR) TIMES DAILY AS NEEDED., Disp: 100 g, Rfl: 5   letrozole (FEMARA) 2.5 MG tablet, TAKE 1 TABLET (2.5 MG TOTAL) BY MOUTH DAILY., Disp: 90 tablet, Rfl: 3   loratadine (CLARITIN) 10 MG tablet, Take 10 mg by mouth daily., Disp: , Rfl:    meloxicam (MOBIC) 7.5 MG tablet, Take 7.5 mg by mouth 2 (two) times daily., Disp: , Rfl:    omeprazole (PRILOSEC) 20 MG capsule, TAKE 1 CAPSULE BY MOUTH EVERY DAY, Disp: 90 capsule, Rfl: 3   polyethylene glycol powder (GLYCOLAX/MIRALAX) powder, TAKE 1 CAPFUL(17G) BY MOUTH DAILY, Disp: 1530 g, Rfl: 3   Propylene Glycol (SYSTANE BALANCE OP), Place 1 drop into both eyes daily., Disp: , Rfl:    traMADol-acetaminophen (ULTRACET) 37.5-325 MG tablet, TAKE 2 TABLETS BY MOUTH AT BEDTIME, Disp: 180 tablet, Rfl: 1   traZODone (DESYREL) 100 MG tablet, TAKE 1 AND 1/2 TABLETS BY MOUTH AT BEDTIME, Disp: 135 tablet, Rfl: 0   triamterene-hydrochlorothiazide (DYAZIDE) 37.5-25 MG capsule, TAKE 1 CAPSULE BY MOUTH EVERY DAY IN THE MORNING, Disp: 90 capsule, Rfl: 3  Current Facility-Administered Medications:    dexamethasone (DECADRON) injection 2 mg, 2 mg, Intra-articular, Once, Shandel Busic T, DPM  Allergies  Allergen Reactions   Codeine     REACTION: Felt like she was going to "pass out"   Review of Systems Objective:  There were no vitals filed for this visit.  General: Well developed, nourished, in no acute distress, alert and oriented x3   Dermatological: Skin is warm, dry and supple bilateral. Nails x 10 are well maintained; remaining integument appears unremarkable at this time. There are no open sores, no preulcerative lesions, no rash or signs of infection present.  Vascular: Dorsalis Pedis artery and Posterior Tibial artery pedal pulses are 2/4 bilateral with immedate capillary fill time. Pedal hair growth present. No varicosities  and no lower extremity edema present bilateral.   Neruologic: Grossly intact via light touch bilateral. Vibratory intact via tuning fork bilateral. Protective threshold with Semmes Wienstein monofilament intact to all pedal sites bilateral. Patellar and Achilles deep tendon reflexes 2+ bilateral. No Babinski or clonus noted bilateral.   Musculoskeletal: No gross boney pedal deformities bilateral. No pain, crepitus, or limitation noted with foot and ankle range of motion bilateral. Muscular strength 5/5 in all groups tested bilateral.  Fluctuance and pain on palpation fifth metatarsal base of the right foot minimally on the left.  Gait: Unassisted, Nonantalgic.    Radiographs:  Radiographs taken today demonstrate osseously mature individual with  osteopenia right foot does demonstrate some spurring at the fifth met base there is some arthritic changes throughout the foot but no acute trauma.  Assessment & Plan:   Assessment: Bursitis of the fifth met right.  Plan: Discussed etiology pathology conservative surgical therapies injected dexamethasone into the bursa today 4 mg was utilized.  Tolerated procedure well.  She will also follow-up with [facility] for new set of orthotics she needs to have a soft area underneath the fifth metatarsal base bilaterally.  Otherwise orthotics can be made similar to her previous pair.     Pascha Fogal T. [place], [place]

## 2022-01-07 ENCOUNTER — Ambulatory Visit: Payer: Medicare Other

## 2022-01-07 ENCOUNTER — Other Ambulatory Visit: Payer: Self-pay

## 2022-01-07 DIAGNOSIS — M7751 Other enthesopathy of right foot: Secondary | ICD-10-CM

## 2022-01-07 DIAGNOSIS — M778 Other enthesopathies, not elsewhere classified: Secondary | ICD-10-CM

## 2022-01-07 NOTE — Progress Notes (Signed)
SITUATION Reason for Consult: Evaluation for Bilateral Custom Foot Orthoses Patient / Caregiver Report: Patient is ready for foot orthotics  OBJECTIVE DATA: Patient History / Diagnosis:    ICD-10-CM   1. Capsulitis of foot, unspecified laterality  M77.8     2. Bursitis of right foot  M77.51       Current or Previous Devices: historical user2  Foot Examination: Skin presentation:   Intact Ulcers & Callousing:   None and no history Toe / Foot Deformities:  Prominent base of 5th Weight Bearing Presentation:  Rectus Sensation:    Intact  ORTHOTIC RECOMMENDATION Recommended Device: 1x pair of custom functional foot orthotics  GOALS OF ORTHOSES - Reduce Pain - Prevent Foot Deformity - Prevent Progression of Further Foot Deformity - Relieve Pressure - Improve the Overall Biomechanical Function of the Foot and Lower Extremity.  ACTIONS PERFORMED Patient was casted for Foot Orthoses via crush box. Procedure was explained and patient tolerated procedure well. All questions were answered and concerns addressed.  PLAN Potential out of pocket cost was communicated to patient. Casts are to be sent to [facility] for fabrication. Patient is to be called for fitting when devices are ready.

## 2022-01-11 ENCOUNTER — Other Ambulatory Visit: Payer: Self-pay | Admitting: Family Medicine

## 2022-01-11 ENCOUNTER — Telehealth: Payer: Self-pay

## 2022-01-11 NOTE — Telephone Encounter (Signed)
Casts sent to central fabrication °

## 2022-01-12 NOTE — Telephone Encounter (Signed)
Refill request for Carisoprodol 350 mg tabs ? ?LOV - 01/25/21 ?Next OV - 01/31/22 ?Last refill - 07/14/21 #90/1 ? ?

## 2022-01-23 ENCOUNTER — Other Ambulatory Visit: Payer: Self-pay | Admitting: Family Medicine

## 2022-01-24 ENCOUNTER — Encounter: Payer: Self-pay | Admitting: Family Medicine

## 2022-01-24 ENCOUNTER — Other Ambulatory Visit: Payer: Self-pay | Admitting: Family Medicine

## 2022-01-24 DIAGNOSIS — R7989 Other specified abnormal findings of blood chemistry: Secondary | ICD-10-CM

## 2022-01-24 DIAGNOSIS — M8580 Other specified disorders of bone density and structure, unspecified site: Secondary | ICD-10-CM

## 2022-01-24 DIAGNOSIS — E559 Vitamin D deficiency, unspecified: Secondary | ICD-10-CM

## 2022-01-24 DIAGNOSIS — E785 Hyperlipidemia, unspecified: Secondary | ICD-10-CM

## 2022-01-24 DIAGNOSIS — I10 Essential (primary) hypertension: Secondary | ICD-10-CM

## 2022-01-24 MED ORDER — CLOBETASOL PROPIONATE 0.05 % EX OINT: 1.0000 "application " | TOPICAL_OINTMENT | CUTANEOUS | 2 refills | Status: DC

## 2022-01-24 NOTE — Telephone Encounter (Signed)
Refill request for traMADol-acetaminophen (ULTRACET) 37.5-325 MG tablet ? ?LOV - 01/25/21 ?Next OV - 01/31/22 ?Last refill - 07/30/21 #180/1 ? ?

## 2022-01-25 ENCOUNTER — Encounter: Payer: Self-pay | Admitting: Family Medicine

## 2022-01-26 ENCOUNTER — Other Ambulatory Visit: Payer: Self-pay

## 2022-01-26 ENCOUNTER — Other Ambulatory Visit (INDEPENDENT_AMBULATORY_CARE_PROVIDER_SITE_OTHER): Payer: Medicare Other

## 2022-01-26 DIAGNOSIS — I10 Essential (primary) hypertension: Secondary | ICD-10-CM

## 2022-01-26 DIAGNOSIS — E559 Vitamin D deficiency, unspecified: Secondary | ICD-10-CM | POA: Diagnosis not present

## 2022-01-26 DIAGNOSIS — M8580 Other specified disorders of bone density and structure, unspecified site: Secondary | ICD-10-CM

## 2022-01-26 DIAGNOSIS — E785 Hyperlipidemia, unspecified: Secondary | ICD-10-CM | POA: Diagnosis not present

## 2022-01-26 DIAGNOSIS — R7989 Other specified abnormal findings of blood chemistry: Secondary | ICD-10-CM

## 2022-01-26 LAB — COMPREHENSIVE METABOLIC PANEL
ALT: 7 U/L (ref 0–35)
AST: 16 U/L (ref 0–37)
Albumin: 4.1 g/dL (ref 3.5–5.2)
Alkaline Phosphatase: 73 U/L (ref 39–117)
BUN: 16 mg/dL (ref 6–23)
CO2: 30 mEq/L (ref 19–32)
Calcium: 10 mg/dL (ref 8.4–10.5)
Chloride: 101 mEq/L (ref 96–112)
Creatinine, Ser: 0.83 mg/dL (ref 0.40–1.20)
GFR: 67.46 mL/min (ref >60.00)
Glucose, Bld: 84 mg/dL (ref 70–99)
Potassium: 3.7 mEq/L (ref 3.5–5.1)
Sodium: 137 mEq/L (ref 135–145)
Total Bilirubin: 0.4 mg/dL (ref 0.2–1.2)
Total Protein: 6.2 g/dL (ref 6.0–8.3)

## 2022-01-26 LAB — CBC WITH DIFFERENTIAL/PLATELET
Basophils Absolute: 0.1 10*3/uL (ref 0.0–0.1)
Basophils Relative: 1.2 % (ref 0.0–3.0)
Eosinophils Absolute: 0.2 10*3/uL (ref 0.0–0.7)
Eosinophils Relative: 3.7 % (ref 0.0–5.0)
HCT: 35.8 % — ABNORMAL LOW (ref 36.0–46.0)
Hemoglobin: 11.5 g/dL — ABNORMAL LOW (ref 12.0–15.0)
Lymphocytes Relative: 28.3 % (ref 12.0–46.0)
Lymphs Abs: 1.6 10*3/uL (ref 0.7–4.0)
MCHC: 32.1 g/dL (ref 30.0–36.0)
MCV: 76 fl — ABNORMAL LOW (ref 78.0–100.0)
Monocytes Absolute: 0.7 10*3/uL (ref 0.1–1.0)
Monocytes Relative: 13.4 % — ABNORMAL HIGH (ref 3.0–12.0)
Neutro Abs: 3 10*3/uL (ref 1.4–7.7)
Neutrophils Relative %: 53.4 % (ref 43.0–77.0)
Platelets: 188 10*3/uL (ref 150.0–400.0)
RBC: 4.72 Mil/uL (ref 3.87–5.11)
RDW: 16.2 % — ABNORMAL HIGH (ref 11.5–15.5)
WBC: 5.6 10*3/uL (ref 4.0–10.5)

## 2022-01-26 LAB — LIPID PANEL
Cholesterol: 184 mg/dL (ref 0–200)
HDL: 43.5 mg/dL (ref >39.00)
LDL Cholesterol: 104 mg/dL — ABNORMAL HIGH (ref 0–99)
NonHDL: 140.02
Total CHOL/HDL Ratio: 4
Triglycerides: 182 mg/dL — ABNORMAL HIGH (ref 0.0–149.0)
VLDL: 36.4 mg/dL (ref 0.0–40.0)

## 2022-01-26 LAB — IRON: Iron: 24 ug/dL — ABNORMAL LOW (ref 42–145)

## 2022-01-26 LAB — VITAMIN D 25 HYDROXY (VIT D DEFICIENCY, FRACTURES): VITD: 38.27 ng/mL (ref 30.00–100.00)

## 2022-01-26 NOTE — Telephone Encounter (Signed)
I spoke with Alyssa Nolan at [facility] and she said tramadol apap had already been picked up this morning. I spoke with pt and she has gotten the rx and nothing else needed. ?

## 2022-01-30 ENCOUNTER — Other Ambulatory Visit: Payer: Self-pay | Admitting: Family Medicine

## 2022-01-30 DIAGNOSIS — D509 Iron deficiency anemia, unspecified: Secondary | ICD-10-CM

## 2022-01-31 ENCOUNTER — Other Ambulatory Visit: Payer: Self-pay

## 2022-01-31 ENCOUNTER — Telehealth: Payer: Self-pay

## 2022-01-31 ENCOUNTER — Ambulatory Visit (INDEPENDENT_AMBULATORY_CARE_PROVIDER_SITE_OTHER): Payer: Medicare Other | Admitting: Family Medicine

## 2022-01-31 ENCOUNTER — Encounter: Payer: Self-pay | Admitting: Family Medicine

## 2022-01-31 VITALS — BP 130/74 | HR 85 | Temp 97.3°F | Ht 64.0 in | Wt 158.0 lb

## 2022-01-31 DIAGNOSIS — Z0000 Encounter for general adult medical examination without abnormal findings: Secondary | ICD-10-CM

## 2022-01-31 DIAGNOSIS — D509 Iron deficiency anemia, unspecified: Secondary | ICD-10-CM | POA: Diagnosis not present

## 2022-01-31 DIAGNOSIS — E785 Hyperlipidemia, unspecified: Secondary | ICD-10-CM | POA: Diagnosis not present

## 2022-01-31 DIAGNOSIS — Z7189 Other specified counseling: Secondary | ICD-10-CM

## 2022-01-31 DIAGNOSIS — D0511 Intraductal carcinoma in situ of right breast: Secondary | ICD-10-CM

## 2022-01-31 DIAGNOSIS — G4700 Insomnia, unspecified: Secondary | ICD-10-CM

## 2022-01-31 DIAGNOSIS — M545 Low back pain, unspecified: Secondary | ICD-10-CM

## 2022-01-31 MED ORDER — TRAZODONE HCL 100 MG PO TABS: ORAL_TABLET | ORAL | 3 refills | Status: DC

## 2022-01-31 MED ORDER — TRIAMTERENE-HCTZ 37.5-25 MG PO CAPS: ORAL_CAPSULE | ORAL | 3 refills | Status: DC

## 2022-01-31 MED ORDER — IRON (FERROUS SULFATE) 325 (65 FE) MG PO TABS: 325.0000 mg | ORAL_TABLET | Freq: Every day | ORAL | 1 refills | Status: DC

## 2022-01-31 NOTE — Telephone Encounter (Signed)
SCHEDULED FOR 05/19/2022 ?

## 2022-01-31 NOTE — Patient Instructions (Addendum)
We'll call about seeing GI.  ?Start iron.  ?Recheck labs in about 1 week.  Nonfasting.  ?Update me as needed.   ?

## 2022-01-31 NOTE — Progress Notes (Signed)
This visit occurred during the SARS-CoV-2 public health emergency.  Safety protocols were in place, including screening questions prior to the visit, additional usage of staff PPE, and extensive cleaning of exam room while observing appropriate contact time as indicated for disinfecting solutions. ? ?I have personally reviewed the Medicare Annual Wellness questionnaire and have noted ?1. The patient's medical and social history ?2. Their use of alcohol, tobacco or illicit drugs ?3. Their current medications and supplements ?4. The patient's functional ability including ADL's, fall risks, home safety risks and hearing or visual ?            impairment. ?5. Diet and physical activities ?6. Evidence for depression or mood disorders ? ?The patients weight, height, BMI have been recorded in the chart and visual acuity is per eye clinic.  ?I have made referrals, counseling and provided education to the patient based review of the above and I have provided the pt with a written personalized care plan for preventive services. ? ?Provider list updated- see scanned forms.  Routine anticipatory guidance given to patient.  See health maintenance. The possibility exists that previously documented standard health maintenance information may have been brought forward from a previous encounter into this note.  If needed, that same information has been updated to reflect the current situation based on today's encounter.   ? ?Flu 2022 ?Shingles previously done ?PNA previously done ?Tetanus 2011.  Discussed with patient ?COVID-vaccine previously done ?Colonoscopy 2013, see avs.  Discussed with patient about GI follow-up. ?Breast cancer screening 2022 ?Bone density test 2022 ?Advance directive-husband designated if patient were incapacitated. ?Cognitive function addressed- see scanned forms- and if abnormal then additional documentation follows.  ? ?In addition to [facility], follow up visit for the below conditions: ? ?Her  husband is chronically ill, d/w pt.   ? ?IDA.  She gave blood in the meantime, prior to labs here and that could explain her labs.  No CP. She has some occ SOB with walking fast.  She is able to do he work at home w/o troubles.  No black or tarry stools.  On PPI with NSAID use.  She had some episodic R sided abd discomfort "like a catch in my side like I had been running really hard."  Variable onset, brief.  Wasn't exertional.  No sx in the last week.  She had lower BP last week, improved today.  ? ?She has some B 1st toe change in sensation but intact monofilament testing today.   ? ?Insomnia.  On trazodone.  It usually helps some.  No ADE on med.   ? ?Has used soma/ultracet prn for muscle/back pain.  No ADE on med.   ? ?Still on femara per outside clinic.  D/w pt.   ? ?Elevated Cholesterol: ?Using medications without problems: yes ?Muscle aches: likely not from statin, d/w pt.   ?Diet compliance: yes ?Exercise: yes ? ?PMH and SH reviewed ? ?Meds, vitals, and allergies reviewed.  ? ?ROS: Per HPI.  Unless specifically indicated otherwise in HPI, the patient denies: ? ?General: fever. ?Eyes: acute vision changes ?ENT: sore throat ?Cardiovascular: chest pain ?Respiratory: SOB ?GI: vomiting ?GU: dysuria ?Musculoskeletal: acute back pain ?Derm: acute rash ?Neuro: acute motor dysfunction ?Psych: worsening mood ?Endocrine: polydipsia ?Heme: bleeding ?Allergy: hayfever ? ?GEN: nad, alert and oriented ?HEENT: NCAT ?NECK: supple w/o LA ?CV: rrr. ?PULM: ctab, no inc wob ?ABD: soft, +bs, nontender to palpation. ?EXT: no edema ?SKIN: Well-perfused.  No jaundice. ?

## 2022-02-01 ENCOUNTER — Encounter: Payer: Self-pay | Admitting: Family Medicine

## 2022-02-03 NOTE — Assessment & Plan Note (Signed)
Has used soma/ultracet prn for muscle/back pain.  No ADE on med.   Continue as is. ?

## 2022-02-03 NOTE — Assessment & Plan Note (Signed)
Flu 2022 ?Shingles previously done ?PNA previously done ?Tetanus 2011.  Discussed with patient ?COVID-vaccine previously done ?Colonoscopy 2013, see avs.  Discussed with patient about GI follow-up. ?Breast cancer screening 2022 ?Bone density test 2022 ?Advance directive-husband designated if patient were incapacitated. ?Cognitive function addressed- see scanned forms- and if abnormal then additional documentation follows.  ?

## 2022-02-03 NOTE — Assessment & Plan Note (Signed)
Would continue atorvastatin. ?

## 2022-02-03 NOTE — Assessment & Plan Note (Signed)
Insomnia.  On trazodone.  It usually helps some.  No ADE on med.  Continue as is. ?

## 2022-02-03 NOTE — Assessment & Plan Note (Signed)
She gave blood in the meantime, prior to labs here and that could explain her labs.  No CP. She has some occ SOB with walking fast.  She is able to do he work at home w/o troubles.  No black or tarry stools.  On PPI with NSAID use.  She had some episodic R sided abd discomfort "like a catch in my side like I had been running really hard."  Variable onset, brief.  Wasn't exertional.  No sx in the last week.  She had lower BP last week, improved today.  ? ?Benign abdominal exam today.  I think it makes sense to recheck her labs and have her see GI.  She agrees with plan.  Still okay for outpatient follow-up. ?

## 2022-02-03 NOTE — Assessment & Plan Note (Signed)
Advance directive- husband designated if patient were incapacitated.  

## 2022-02-03 NOTE — Assessment & Plan Note (Signed)
Per outside clinic. 

## 2022-02-07 ENCOUNTER — Encounter: Payer: Self-pay | Admitting: Family Medicine

## 2022-02-09 ENCOUNTER — Encounter: Payer: Self-pay | Admitting: Physician Assistant

## 2022-02-09 ENCOUNTER — Ambulatory Visit (INDEPENDENT_AMBULATORY_CARE_PROVIDER_SITE_OTHER): Payer: Medicare Other | Admitting: Physician Assistant

## 2022-02-09 ENCOUNTER — Other Ambulatory Visit: Payer: Self-pay | Admitting: Family Medicine

## 2022-02-09 VITALS — BP 116/56 | HR 74 | Ht 64.0 in | Wt 159.8 lb

## 2022-02-09 DIAGNOSIS — D509 Iron deficiency anemia, unspecified: Secondary | ICD-10-CM | POA: Diagnosis not present

## 2022-02-09 DIAGNOSIS — K219 Gastro-esophageal reflux disease without esophagitis: Secondary | ICD-10-CM | POA: Diagnosis not present

## 2022-02-09 MED ORDER — NA SULFATE-K SULFATE-MG SULF 17.5-3.13-1.6 GM/177ML PO SOLN: 1.0000 | Freq: Once | ORAL | 0 refills | Status: AC

## 2022-02-09 NOTE — Progress Notes (Signed)
? ?Subjective:  ? ? Patient ID: Alyssa Nolan, female    DOB: [birth year], 81 y.o.   MRN: [id number] ? ?HPI ?Alyssa Nolan is a pleasant 81 year old white female, established with Dr. Hilarie Fredrickson.  She is referred back today per Dr. Elsie Stain with new finding of iron deficiency anemia. ?She was last seen here in February 2023 with an anal fissure. ?She has history of hypertension, chronic GERD for which she is on omeprazole 20 mg p.o. every morning, osteoarthritis for which she takes Mobic, history of breast cancer/DCIS right breast 2017. ?Patient had labs done on 01/26/2022 with finding of WBC 5.6, hemoglobin 11.5/hematocrit 35.8/MCV 76 ?Serum iron 24, c-Met unremarkable.  She has been started on oral iron 325 mg once daily. ?Hemoglobin 1 year ago March 2022 was 15 hematocrit of 44. ?Reviewing her chart she was also iron deficient in 2014, was seen here, opted to have Hemoccult testing done which was negative, her blood counts quickly improved and further GI work-up was not pursued. ?She did have colonoscopy done in October 2013 per Dr. Jimmy Picket for screening and this was normal.  She has not had prior EGD. ?She denies any current GI complaints, specifically no changes in bowel habits, no melena or hematochezia.  GERD symptoms controlled on omeprazole, no dysphagia or odynophagia.  She does mention a right sided abdominal pain that was present intermittently for a couple of weeks very sporadically.  She describes it as a "catch".  This has resolved.  This was not associated with eating or bowel movements, or position and would be very brief/transient when present. ?She does take Mobic twice daily and has done so long-term for arthritis in her hip and her lower back. ?She has also been a blood donor.  She did give blood on February 10 and thinks she may have donated blood once in 2022.  She had not donated in the 2 years prior to that time due to the pandemic.  She says her blood counts were checked prior to  this most recent blood donation and she believes she was told her hemoglobin was 13. ? ?Review of Systems Pertinent positive and negative review of systems were noted in the above HPI section.  All other review of systems was otherwise negative.  ? ?Outpatient Encounter Medications as of 02/09/2022  ?Medication Sig  ? AMBULATORY NON FORMULARY MEDICATION Medication Name: Diltiazem 2%/Lidocaine 2% ? ? Using your index finger apply a small amount of medication inside the anal opening and to the external anal area three times daily x 6-8 weeks.  ? atorvastatin (LIPITOR) 20 MG tablet TAKE 1 TABLET BY MOUTH EVERY DAY  ? Calcium Carbonate-Vitamin D 600-400 MG-UNIT tablet Take 1 tablet by mouth 2 (two) times daily.  ? carisoprodol (SOMA) 350 MG tablet TAKE 1 TABLET BY MOUTH EVERYDAY AT BEDTIME  ? clobetasol ointment (TEMOVATE) 1.93 % Apply 1 application. topically every other day.  ? diclofenac Sodium (VOLTAREN) 1 % GEL APPLY 2 GRAMS TOPICALLY 4 (FOUR) TIMES DAILY AS NEEDED.  ? Iron, Ferrous Sulfate, 325 (65 Fe) MG TABS Take 325 mg by mouth daily.  ? letrozole (FEMARA) 2.5 MG tablet TAKE 1 TABLET (2.5 MG TOTAL) BY MOUTH DAILY.  ? loratadine (CLARITIN) 10 MG tablet Take 10 mg by mouth daily.  ? meloxicam (MOBIC) 7.5 MG tablet Take 7.5 mg by mouth 2 (two) times daily.  ? omeprazole (PRILOSEC) 20 MG capsule TAKE 1 CAPSULE BY MOUTH EVERY DAY  ? polyethylene glycol powder (GLYCOLAX/MIRALAX) powder TAKE 1 CAPFUL(17G)  BY MOUTH DAILY  ? Propylene Glycol (SYSTANE BALANCE OP) Place 1 drop into both eyes daily.  ? traMADol-acetaminophen (ULTRACET) 37.5-325 MG tablet TAKE 2 TABLETS BY MOUTH AT BEDTIME  ? traZODone (DESYREL) 100 MG tablet 1.5 tabs by mouth at night for sleep.  ? triamterene-hydrochlorothiazide (DYAZIDE) 37.5-25 MG capsule TAKE 1 CAPSULE BY MOUTH EVERY DAY IN THE MORNING  ? ?No facility-administered encounter medications on file as of 02/09/2022.  ? ?Allergies  ?Allergen Reactions  ? Codeine   ?  REACTION: Felt like she  was going to "pass out"  ? ?Patient Active Problem List  ? Diagnosis Date Noted  ? 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 Abdominal aortic atherosclerosis ([place]) 08/16/2021  ? Low back pain 08/16/2021  ? Lumbar radiculopathy 07/16/2021  ? Chronic constipation 01/12/2021  ? Urinary urgency 09/01/2019  ? Trochanteric bursitis of right hip 04/09/2019  ? Healthcare maintenance 12/07/2018  ? Syncope 10/13/2017  ? 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 Vaginal atrophy 03/15/2017  ? Malignant neoplasm of upper-outer quadrant of right female breast ([place]) 05/03/2016  ? Iron deficiency anemia 12/23/2013  ? Abnormal CBC 09/12/2013  ? 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 Medicare annual wellness visit, subsequent 08/13/2012  ? GERD (gastroesophageal reflux disease) 08/11/2011  ? Seasonal allergies 08/11/2011  ? Advance care planning 08/11/2011  ? Osteopenia 08/11/2011  ? Insomnia 01/11/2011  ? Hyperlipidemia 07/07/2010  ? Essential hypertension 07/07/2010  ? Osteoarthritis 07/07/2010  ? SKIN CANCER, HX OF 07/07/2010  ? ?Social History  ? ?Socioeconomic History  ? Marital status: Married  ?  Spouse name: Not on file  ? Number of children: 2  ? Years of education: Not on file  ? Highest education level: Not on file  ?Occupational History  ? Occupation: Retired since 2007 and moved to [place]  ?  Employer: RETIRED  ?Tobacco Use  ? Smoking status: Former  ?  Types: Cigarettes  ?  Quit date: 11/14/1969  ?  Years since quitting: 52.2  ? Smokeless tobacco: Never  ?Vaping Use  ? Vaping Use: Never used  ?Substance and Sexual Activity  ? Alcohol use: Not Currently  ? Drug use: No  ? Sexual activity: Not Currently  ?  Birth control/protection: Post-menopausal  ?Other Topics Concern  ? Not on file  ?Social History Narrative  ? From [place]  ? College:  [employer] grad  ? 2 kids in [place]  ? Plays cards and likes to knit  ? ?Social Determinants of Health  ? ?Financial Resource Strain: Not on file  ?Food  Insecurity: Not on file  ?Transportation Needs: Not on file  ?Physical Activity: Not on file  ?Stress: Not on file  ?Social Connections: Not on file  ?Intimate Partner Violence: Not on file  ? ? ?Ms. Romo's family history includes COPD in her mother; Colon cancer in an other family member; Heart disease in her father; Stroke (age of onset: 36) in her maternal grandfather. ? ? ?   ?Objective:  ?  ?Vitals:  ? 02/09/22 1508  ?BP: (!) 116/56  ?Pulse: 74  ? ? ?Physical Exam.Well-developed well-nourished  eld WF  in no acute distress.Pleasant    [phone]  BMI27.4 ? ?HEENT; nontraumatic normocephalic, EOMI, PE R LA, sclera anicteric. ?Oropharynx; not examined today ?Neck; supple, no JVD ?Cardiovascular; regular rate and rhythm with S1-S2, no murmur rub or gallop ?Pulmonary; Clear bilaterally ?Abdomen; soft, nontender, nondistended, no palpable mass or hepatosplenomegaly, bowel sounds are active ?Rectal;  not done today ?Skin; benign exam, no jaundice rash or appreciable lesions ?Extremities; no clubbing cyanosis or edema skin warm and dry ?Neuro/Psych; alert and oriented x4, grossly nonfocal mood and affect appropriate  ? ? ? ?   ?Assessment & Plan:  ? ?#67 81 year old white female with new iron deficiency anemia.  No current active GI symptoms, no history of melena or hematochezia.  She does mention a transient right-sided abdominal pain that had been present a few weeks ago and has since resolved. ?Patient also has history of anemia in 2014 which resolved with iron supplementation ? ?She has been a blood donor and donated 1 unit of blood December 24, 2021, donated 1 other time in 2022 and had not donated over the 2 previous years due to the pandemic. ? ?She does take chronic NSAIDs ? ?Etiology of the iron deficiency anemia is not clear, rule out intermittent occult GI blood loss secondary to chronic gastropathy, AVMs, neoplasm ?Certainly her hemoglobin may have dropped a gram after this most recent donation of  blood, but that would not account for the iron deficiency. ? ?Negative colonoscopy 2013, no prior EGD ? ?#2 chronic GERD-stable on omeprazole 20 mg ?#3.  Hypertension ?#4.  Osteoarthritis ?#5.  History of brea

## 2022-02-09 NOTE — Patient Instructions (Signed)
If you are age 79 or older, your body mass index should be between 23-30. Your Body mass index is 27.43 kg/m?Marland Kitchen If this is out of the aforementioned range listed, please consider follow up with your Primary Care Provider. ?________________________________________________________ ? ?The [facility] providers would like to encourage you to use [facility] to communicate with providers for non-urgent requests or questions.  Due to long hold times on the telephone, sending your provider a message by [facility] may be a faster and more efficient way to get a response.  Please allow 48 business hours for a response.  Please remember that this is for non-urgent requests.  ?_______________________________________________________ ? ?You have been scheduled for an endoscopy and colonoscopy. Please follow the written instructions given to you at your visit today. ?Please pick up your prep supplies at the pharmacy within the next 1-3 days. ?If you use inhalers (even only as needed), please bring them with you on the day of your procedure. ? ?Follow up as needed for now. ? ?Thank you for entrusting me with your care and choosing [facility]. ? ?Nicoletta Ba, PA-C ?

## 2022-02-10 ENCOUNTER — Other Ambulatory Visit (INDEPENDENT_AMBULATORY_CARE_PROVIDER_SITE_OTHER): Payer: Medicare Other

## 2022-02-10 DIAGNOSIS — D509 Iron deficiency anemia, unspecified: Secondary | ICD-10-CM

## 2022-02-11 LAB — CBC WITH DIFFERENTIAL/PLATELET
Basophils Absolute: 0.1 10*3/uL (ref 0.0–0.1)
Basophils Relative: 1.1 % (ref 0.0–3.0)
Eosinophils Absolute: 0.2 10*3/uL (ref 0.0–0.7)
Eosinophils Relative: 3.4 % (ref 0.0–5.0)
HCT: 38.6 % (ref 36.0–46.0)
Hemoglobin: 12.3 g/dL (ref 12.0–15.0)
Lymphocytes Relative: 19.6 % (ref 12.0–46.0)
Lymphs Abs: 1.4 10*3/uL (ref 0.7–4.0)
MCHC: 31.8 g/dL (ref 30.0–36.0)
MCV: 76.7 fl — ABNORMAL LOW (ref 78.0–100.0)
Monocytes Absolute: 0.6 10*3/uL (ref 0.1–1.0)
Monocytes Relative: 8.4 % (ref 3.0–12.0)
Neutro Abs: 4.9 10*3/uL (ref 1.4–7.7)
Neutrophils Relative %: 67.5 % (ref 43.0–77.0)
Platelets: 198 10*3/uL (ref 150.0–400.0)
RBC: 5.04 Mil/uL (ref 3.87–5.11)
RDW: 17.3 % — ABNORMAL HIGH (ref 11.5–15.5)
WBC: 7.2 10*3/uL (ref 4.0–10.5)

## 2022-02-11 LAB — IRON: Iron: 419 ug/dL — ABNORMAL HIGH (ref 42–145)

## 2022-02-13 ENCOUNTER — Other Ambulatory Visit: Payer: Self-pay | Admitting: Family Medicine

## 2022-02-13 DIAGNOSIS — D509 Iron deficiency anemia, unspecified: Secondary | ICD-10-CM

## 2022-02-14 NOTE — Progress Notes (Signed)
Addendum: Reviewed and agree with assessment and management plan. Senon Nixon M, MD  

## 2022-02-16 ENCOUNTER — Other Ambulatory Visit: Payer: Medicare Other

## 2022-02-20 ENCOUNTER — Telehealth: Payer: Self-pay | Admitting: Family Medicine

## 2022-02-20 DIAGNOSIS — Z170 Estrogen receptor positive status [ER+]: Secondary | ICD-10-CM

## 2022-02-20 DIAGNOSIS — D0511 Intraductal carcinoma in situ of right breast: Secondary | ICD-10-CM

## 2022-02-20 NOTE — Telephone Encounter (Signed)
Please check with Dr. Dwyane Luo clinic to see about the planned duration of letrozole treatment and let me know.  Thanks. ?

## 2022-02-21 ENCOUNTER — Ambulatory Visit (INDEPENDENT_AMBULATORY_CARE_PROVIDER_SITE_OTHER): Payer: Medicare Other

## 2022-02-21 DIAGNOSIS — M7751 Other enthesopathy of right foot: Secondary | ICD-10-CM

## 2022-02-21 DIAGNOSIS — M778 Other enthesopathies, not elsewhere classified: Secondary | ICD-10-CM | POA: Diagnosis not present

## 2022-02-21 NOTE — Progress Notes (Signed)
SITUATION: ?Reason for Visit: Fitting and Delivery of Custom Fabricated Foot Orthoses ?Patient Report: Patient reports comfort and is satisfied with device. ? ?OBJECTIVE DATA: ?Patient History / Diagnosis:   ?  ICD-10-CM   ?1. Capsulitis of foot, unspecified laterality  M77.8   ?  ?2. Bursitis of right foot  M77.51   ?  ? ? ?Provided Device:  Custom Functional Foot Orthotics ?    [employer]: [id number] ? ?GOAL OF ORTHOSIS ?- Improve gait ?- Decrease energy expenditure ?- Improve Balance ?- Provide Triplanar stability of foot complex ?- Facilitate motion ? ?ACTIONS PERFORMED ?Patient was fit with foot orthotics trimmed to shoe last. Patient tolerated fittign procedure.  ? ?Patient was provided with verbal and written instruction and demonstration regarding donning, doffing, wear, care, proper fit, function, purpose, cleaning, and use of the orthosis and in all related precautions and risks and benefits regarding the orthosis. ? ?Patient was also provided with verbal instruction regarding how to report any failures or malfunctions of the orthosis and necessary follow up care. Patient was also instructed to contact our office regarding any change in status that may affect the function of the orthosis. ? ?Patient demonstrated independence with proper donning, doffing, and fit and verbalized understanding of all instructions. ? ?PLAN: ?Patient is to follow up in one week or as necessary (PRN). All questions were answered and concerns addressed. Plan of care was discussed with and agreed upon by the patient. ? ?

## 2022-02-23 NOTE — Telephone Encounter (Signed)
Called Dr. Curly Shores office and they will give [place] a call back tomorrow with answer to planned duration of medication.  ?

## 2022-02-24 ENCOUNTER — Telehealth: Payer: Self-pay

## 2022-02-24 NOTE — Telephone Encounter (Signed)
Called and spoke with patient  , She get this rx  from another doctor . Pt said that she contact their office to get this medication refilled. ?

## 2022-02-24 NOTE — Telephone Encounter (Signed)
New message   Pt c/o medication issue:  1. Name of Medication: letrozole (FEMARA) 2.5 MG tablet  2. How are you currently taking this medication (dosage and times per day)? Once a day    3. Are you having a reaction (difficulty breathing--STAT)? No   4. What is your medication issue? Patient needs to continue medication until 2027

## 2022-03-01 NOTE — Telephone Encounter (Signed)
This message was taken wrong. This call was from Dr. Curly Shores office to give requested information back to [place] from a previous TE note. Patient was supposed to continue this medication until 2027 as it states at the end of this message. Patient was not asking for a refill. I called Dr. Curly Shores office back this am to obtain this information and was told they already called [place] back and spoke to someone in our office. I was never given the information as it was put in as a refill request.  ?

## 2022-03-01 NOTE — Telephone Encounter (Signed)
Called Dr. Dwyane Luo office back to see if they had the requested information for me yet. Dr. Keith Rake CMA did call back last week with requested information but TE was taken wrong and I never received the message. Sharyn Lull stated that patient is to be on letrozole until 2027. ?

## 2022-03-02 ENCOUNTER — Encounter: Payer: Self-pay | Admitting: Family Medicine

## 2022-03-02 NOTE — Telephone Encounter (Signed)
Noted. Thanks.

## 2022-03-18 ENCOUNTER — Other Ambulatory Visit (INDEPENDENT_AMBULATORY_CARE_PROVIDER_SITE_OTHER): Payer: Medicare Other

## 2022-03-18 DIAGNOSIS — D509 Iron deficiency anemia, unspecified: Secondary | ICD-10-CM | POA: Diagnosis not present

## 2022-03-18 NOTE — Addendum Note (Signed)
Addended by: Tammi Sou on: 03/18/2022 01:31 PM ? ? Modules accepted: Orders ? ?

## 2022-03-19 LAB — CBC WITH DIFFERENTIAL/PLATELET
Absolute Monocytes: 664 cells/uL (ref 200–950)
Basophils Absolute: 79 cells/uL (ref 0–200)
Basophils Relative: 1 %
Eosinophils Absolute: 174 cells/uL (ref 15–500)
Eosinophils Relative: 2.2 %
HCT: 44.8 % (ref 35.0–45.0)
Hemoglobin: 14.1 g/dL (ref 11.7–15.5)
Lymphs Abs: 1904 cells/uL (ref 850–3900)
MCH: 25.6 pg — ABNORMAL LOW (ref 27.0–33.0)
MCHC: 31.5 g/dL — ABNORMAL LOW (ref 32.0–36.0)
MCV: 81.5 fL (ref 80.0–100.0)
MPV: 12.3 fL (ref 7.5–12.5)
Monocytes Relative: 8.4 %
Neutro Abs: 5080 cells/uL (ref 1500–7800)
Neutrophils Relative %: 64.3 %
Platelets: 186 10*3/uL (ref 140–400)
RBC: 5.5 10*6/uL — ABNORMAL HIGH (ref 3.80–5.10)
RDW: 18.7 % — ABNORMAL HIGH (ref 11.0–15.0)
Total Lymphocyte: 24.1 %
WBC: 7.9 10*3/uL (ref 3.8–10.8)

## 2022-03-19 LAB — IRON: Iron: 36 ug/dL — ABNORMAL LOW (ref 45–160)

## 2022-03-23 ENCOUNTER — Encounter: Payer: Self-pay | Admitting: Family Medicine

## 2022-03-25 ENCOUNTER — Encounter: Payer: Medicare Other | Admitting: Internal Medicine

## 2022-03-25 ENCOUNTER — Telehealth: Payer: Self-pay | Admitting: *Deleted

## 2022-03-25 MED ORDER — ONDANSETRON HCL 4 MG PO TABS: 4.0000 mg | ORAL_TABLET | Freq: Three times a day (TID) | ORAL | 0 refills | Status: DC | PRN

## 2022-03-25 NOTE — Telephone Encounter (Signed)
Returned patient's call. She vomited after taking both preps... last night and this am. Results were liquid with some solid pieces.Suggested that the patient could try Miralax this morning or reschedule and she chose to reschedule. Will send new prep instructions and check with Dr. Hilarie Fredrickson to see if we can do Zofran prior to both prep doses. ?

## 2022-03-25 NOTE — Telephone Encounter (Signed)
Pre procedure instructions sent via [place] to patient as long as a RX for zofran sent to her pharmacy.  ?

## 2022-04-27 ENCOUNTER — Ambulatory Visit (AMBULATORY_SURGERY_CENTER): Payer: Medicare Other | Admitting: Internal Medicine

## 2022-04-27 ENCOUNTER — Encounter: Payer: Medicare Other | Admitting: Internal Medicine

## 2022-04-27 ENCOUNTER — Encounter: Payer: Self-pay | Admitting: Family Medicine

## 2022-04-27 ENCOUNTER — Other Ambulatory Visit: Payer: Self-pay | Admitting: Family Medicine

## 2022-04-27 ENCOUNTER — Encounter: Payer: Self-pay | Admitting: Internal Medicine

## 2022-04-27 VITALS — BP 130/61 | HR 68 | Temp 97.3°F | Resp 17 | Ht 64.0 in | Wt 159.0 lb

## 2022-04-27 DIAGNOSIS — K2100 Gastro-esophageal reflux disease with esophagitis, without bleeding: Secondary | ICD-10-CM | POA: Diagnosis not present

## 2022-04-27 DIAGNOSIS — K229 Disease of esophagus, unspecified: Secondary | ICD-10-CM

## 2022-04-27 DIAGNOSIS — K2270 Barrett's esophagus without dysplasia: Secondary | ICD-10-CM | POA: Diagnosis not present

## 2022-04-27 DIAGNOSIS — D509 Iron deficiency anemia, unspecified: Secondary | ICD-10-CM

## 2022-04-27 DIAGNOSIS — K219 Gastro-esophageal reflux disease without esophagitis: Secondary | ICD-10-CM | POA: Diagnosis not present

## 2022-04-27 DIAGNOSIS — K2950 Unspecified chronic gastritis without bleeding: Secondary | ICD-10-CM | POA: Diagnosis not present

## 2022-04-27 DIAGNOSIS — K317 Polyp of stomach and duodenum: Secondary | ICD-10-CM

## 2022-04-27 DIAGNOSIS — K2970 Gastritis, unspecified, without bleeding: Secondary | ICD-10-CM

## 2022-04-27 DIAGNOSIS — I10 Essential (primary) hypertension: Secondary | ICD-10-CM | POA: Diagnosis not present

## 2022-04-27 DIAGNOSIS — E785 Hyperlipidemia, unspecified: Secondary | ICD-10-CM | POA: Diagnosis not present

## 2022-04-27 DIAGNOSIS — K5730 Diverticulosis of large intestine without perforation or abscess without bleeding: Secondary | ICD-10-CM | POA: Diagnosis not present

## 2022-04-27 MED ORDER — SODIUM CHLORIDE 0.9 % IV SOLN: 500.0000 mL | Freq: Once | INTRAVENOUS | Status: DC

## 2022-04-27 MED ORDER — IRON (FERROUS SULFATE) 325 (65 FE) MG PO TABS: 325.0000 mg | ORAL_TABLET | Freq: Every day | ORAL | Status: DC

## 2022-04-27 NOTE — Progress Notes (Signed)
Pt's states no medical or surgical changes since previsit or office visit. 

## 2022-04-27 NOTE — Progress Notes (Signed)
Called to room to assist during endoscopic procedure.  Patient ID and intended procedure confirmed with present staff. Received instructions for my participation in the procedure from the performing physician.  

## 2022-04-27 NOTE — Progress Notes (Signed)
To pacu, VSS. Report to Rn.tb 

## 2022-04-27 NOTE — Progress Notes (Signed)
GASTROENTEROLOGY PROCEDURE H&P NOTE   Primary Care Physician: Tonia Ghent, MD    Reason for Procedure:  Iron deficiency anemia, history of GERD  Plan:    EGD and colonoscopy  Patient is appropriate for endoscopic procedure(s) in the ambulatory ([place]) setting.  The nature of the procedure, as well as the risks, benefits, and alternatives were carefully and thoroughly reviewed with the patient. Ample time for discussion and questions allowed. The patient understood, was satisfied, and agreed to proceed.     HPI: Alyssa Nolan is a 81 y.o. female who presents for EGD and colonoscopy.  Medical history as below.  Tolerated the prep.  No recent chest pain or shortness of breath.  No abdominal pain today.  Past Medical History:  Diagnosis Date   Arthritis 2011   R hip injection per Dr. Nelva Bush   Basal cell carcinoma 05/04/2010   ant and sup edge of left nose and nasal alar crease   Basal cell carcinoma 01/22/2014   right chest/excision   Basal cell carcinoma 10/28/2019   right crease nasal ala   Breast cancer ([place]) 04/28/2016   With plan to be on letrozole until 2027.  T1c, N0; ER/PR positive, HER-2/neu negative. INVASIVE DUCTAL CARCINOMA., DCIS   GERD (gastroesophageal reflux disease)    History of chickenpox    Hyperlipidemia    Hypertension    Lichen planus    Osteopenia    Tscore -1, consider repeat in ~2013, per Dr. Amalia Hailey with gyn   Personal history of radiation therapy 2017   MammoSite   Skin cancer    BCC, SCC on nose   Squamous cell carcinoma of skin 03/29/2010   left nose ant alar crease/in situ   Squamous cell carcinoma of skin 12/30/2013   left prox zygoma/in situ    Past Surgical History:  Procedure Laterality Date   BASAL CELL CARCINOMA EXCISION  01-22-14   chest area. Dr Nehemiah Massed   BREAST BIOPSY Right 2013   CORE - NEG, cyst with ductal hyperplasia without atypia Dr Bary Castilla   BREAST BIOPSY Right 04-28-16   Invasive ductal carcinoma   BREAST  CYST ASPIRATION Bilateral    BREAST EXCISIONAL BIOPSY Right 1968   NEG   BREAST LUMPECTOMY Right 2017   IMC/DCIS, negative LNs   BREAST LUMPECTOMY WITH NEEDLE LOCALIZATION Right 06/10/2016   Procedure: BREAST LUMPECTOMY WITH NEEDLE LOCALIZATION;  Surgeon: Robert Bellow, MD;  Location: [facility] ORS;  Service: General;  Laterality: Right;   BREAST LUMPECTOMY WITH SENTINEL LYMPH NODE BIOPSY Right 06/10/2016   Procedure: BREAST LUMPECTOMY WITH SENTINEL LYMPH NODE BX / WITH RECONSTRUCTION;  Surgeon: Robert Bellow, MD;  Location: [facility] ORS;  Service: General;  Laterality: Right;   [place] TEST  02/07/2002   CESAREAN Bas[place]  2005   Dr Helane Rima, [place]   COLONOSCOPY  04-22-02   Dr Ricarda Frame, [place]  09-02-12   Dr Bary Castilla   Endometrial ablation and right ovary removal  2002   MRI, hip  01/12/2004   MRI, neck  06/23/1998  10/31/2003   OOPHORECTOMY Bilateral 15+ YRS AGO   Removal of excess eyelids and shortening of eyelid tendons  01/2010   Removal of squamous and basal cell carcinoma from nose  04/2010   Right hip injection  03/30/2009   SKIN SURGERY  10/28/2019   removal of basal cell   Touch Up of excess  eyelids  04/20/2010    Prior to Admission medications   Medication Sig Start Date End Date Taking? Authorizing Provider  atorvastatin (LIPITOR) 20 MG tablet TAKE 1 TABLET BY MOUTH EVERY DAY 02/09/22  Yes Tonia Ghent, MD  Calcium Carbonate-Vitamin D 600-400 MG-UNIT tablet Take 1 tablet by mouth 2 (two) times daily.   Yes [provider]  carisoprodol (SOMA) 350 MG tablet TAKE 1 TABLET BY MOUTH EVERYDAY AT BEDTIME 01/12/22  Yes Tonia Ghent, MD  clobetasol ointment (TEMOVATE) 8.29 % Apply 1 application. topically every other day. 01/24/22  Yes Tonia Ghent, MD  letrozole [facility]) 2.5 MG tablet TAKE 1 TABLET (2.5 MG TOTAL) BY MOUTH DAILY. 08/23/18  Yes Byrnett, Forest Gleason, MD  loratadine  (CLARITIN) 10 MG tablet Take 10 mg by mouth daily.   Yes [provider]  meloxicam (MOBIC) 7.5 MG tablet Take 7.5 mg by mouth 2 (two) times daily.   Yes [provider]  omeprazole (PRILOSEC) 20 MG capsule TAKE 1 CAPSULE BY MOUTH EVERY DAY 12/29/21  Yes Zehr, Janett Billow D, PA-C  ondansetron (ZOFRAN) 4 MG tablet Take 1 tablet (4 mg total) by mouth every 8 (eight) hours as needed (take one 20-30 minutes  prior to drinking colonoscopy prep). 03/25/22  Yes Cristo Ausburn, Lajuan Lines, MD  polyethylene glycol powder (GLYCOLAX/MIRALAX) powder TAKE 1 CAPFUL(17G) BY MOUTH DAILY 03/10/17  Yes Allegra Cerniglia, Lajuan Lines, MD  traMADol-acetaminophen (ULTRACET) 37.5-325 MG tablet TAKE 2 TABLETS BY MOUTH AT BEDTIME 01/26/22  Yes Tonia Ghent, MD  traZODone (DESYREL) 100 MG tablet 1.5 tabs by mouth at night for sleep. 01/31/22  Yes Tonia Ghent, MD  triamterene-hydrochlorothiazide (DYAZIDE) 37.5-25 MG capsule TAKE 1 CAPSULE BY MOUTH EVERY DAY IN THE MORNING 01/31/22  Yes Tonia Ghent, MD  AMBULATORY NON FORMULARY MEDICATION Medication Name: Diltiazem 2%/Lidocaine 2%   Using your index finger apply a small amount of medication inside the anal opening and to the external anal area three times daily x 6-8 weeks. 12/21/21   Levin Erp, PA  diclofenac Sodium (VOLTAREN) 1 % GEL APPLY 2 GRAMS TOPICALLY 4 (FOUR) TIMES DAILY AS NEEDED. 04/07/20   Tonia Ghent, MD  Propylene Glycol (SYSTANE BALANCE OP) Place 1 drop into both eyes daily.    [provider]    Current Outpatient Medications  Medication Sig Dispense Refill   atorvastatin (LIPITOR) 20 MG tablet TAKE 1 TABLET BY MOUTH EVERY DAY 90 tablet 3   Calcium Carbonate-Vitamin D 600-400 MG-UNIT tablet Take 1 tablet by mouth 2 (two) times daily.     carisoprodol (SOMA) 350 MG tablet TAKE 1 TABLET BY MOUTH EVERYDAY AT BEDTIME 90 tablet 1   clobetasol ointment (TEMOVATE) 9.37 % Apply 1 application. topically every other day. 30 g 2   letrozole (FEMARA)  2.5 MG tablet TAKE 1 TABLET (2.5 MG TOTAL) BY MOUTH DAILY. 90 tablet 3   loratadine (CLARITIN) 10 MG tablet Take 10 mg by mouth daily.     meloxicam (MOBIC) 7.5 MG tablet Take 7.5 mg by mouth 2 (two) times daily.     omeprazole (PRILOSEC) 20 MG capsule TAKE 1 CAPSULE BY MOUTH EVERY DAY 90 capsule 3   ondansetron (ZOFRAN) 4 MG tablet Take 1 tablet (4 mg total) by mouth every 8 (eight) hours as needed (take one 20-30 minutes  prior to drinking colonoscopy prep). 4 tablet 0   polyethylene glycol powder (GLYCOLAX/MIRALAX) powder TAKE 1 CAPFUL(17G) BY MOUTH DAILY 1530 g 3   traMADol-acetaminophen (ULTRACET)  37.5-325 MG tablet TAKE 2 TABLETS BY MOUTH AT BEDTIME 180 tablet 1   traZODone (DESYREL) 100 MG tablet 1.5 tabs by mouth at night for sleep. 135 tablet 3   triamterene-hydrochlorothiazide (DYAZIDE) 37.5-25 MG capsule TAKE 1 CAPSULE BY MOUTH EVERY DAY IN THE MORNING 90 capsule 3   AMBULATORY NON FORMULARY MEDICATION Medication Name: Diltiazem 2%/Lidocaine 2%   Using your index finger apply a small amount of medication inside the anal opening and to the external anal area three times daily x 6-8 weeks. 30 g 1   diclofenac Sodium (VOLTAREN) 1 % GEL APPLY 2 GRAMS TOPICALLY 4 (FOUR) TIMES DAILY AS NEEDED. 100 g 5   Propylene Glycol (SYSTANE BALANCE OP) Place 1 drop into both eyes daily.     Current Facility-Administered Medications  Medication Dose Route Frequency Provider Last Rate Last Admin   0.9 %  sodium chloride infusion  500 mL Intravenous Once Seanpaul Preece, Lajuan Lines, MD        Allergies as of 04/27/2022 - Review Complete 04/27/2022  Allergen Reaction Noted   Codeine      Family History  Problem Relation Age of Onset   COPD Mother        Smoker   Heart disease Father        Possible CHF   Stroke Maternal Grandfather 83       CVA   Colon cancer Other        mother's 1/2 sister   Breast cancer Neg Hx    Diabetes Neg Hx    Liver disease Neg Hx    Pancreatic cancer Neg Hx    Esophageal  cancer Neg Hx    Stomach cancer Neg Hx     Social History   Socioeconomic History   Marital status: Married    Spouse name: Not on file   Number of children: 2   Years of education: Not on file   Highest education level: Not on file  Occupational History   Occupation: Retired since 2007 and moved to [employer]    Employer: RETIRED  Tobacco Use   Smoking status: Former    Types: Cigarettes    Quit date: 11/14/1969    Years since quitting: 52.4   Smokeless tobacco: Never  Vaping Use   Vaping Use: Never used  Substance and Sexual Activity   Alcohol use: Not Currently   Drug use: No   Sexual activity: Not Currently    Birth control/protection: Post-menopausal  Other Topics Concern   Not on file  Social History Narrative   From [place]:  [place] grad   2 kids in [place] cards and likes to [employer]   Social Determinants of Health   Financial Resource Strain: Low Risk  (12/09/2019)   Overall Financial Resource Strain (CARDIA)    Difficulty of Paying Living Expenses: Not hard at all  Food Insecurity: No Food Insecurity (12/09/2019)   Hunger Vital Sign    Worried About Running Out of Food in the Last Year: Never true    [place] in the Last Year: Never true  Transportation Needs: No Transportation Needs (12/09/2019)   PRAPARE - Hydrologist (Medical): No    Lack of Transportation (Non-Medical): No  Physical Activity: Insufficiently Active (12/09/2019)   Exercise Vital Sign    Days of Exercise per Week: 3 days    Minutes of Exercise per Session: 40 min  Stress: No Stress Concern  Present (12/09/2019)   [place]    Feeling of Stress : Not at all  Social Connections: Not on file  Intimate Partner Violence: Not At Risk (12/09/2019)   Humiliation, Afraid, Rape, and Kick questionnaire    Fear of Current or Ex-Partner: No    Emotionally Abused: No    Physically  Abused: No    Sexually Abused: No    Physical Exam: Vital signs in last 24 hours: @BP  (!) 149/70   Pulse 74   Temp (!) 97.3 F (36.3 C)   Ht 5' 4"  (1.626 m)   Wt 159 lb (72.1 kg)   SpO2 95%   BMI 27.29 kg/m  GEN: NAD EYE: Sclerae anicteric ENT: MMM CV: Non-tachycardic Pulm: CTA b/l GI: Soft, NT/ND NEURO:  Alert & Oriented x 3   Zenovia Jarred, MD [facility]  04/27/2022 2:38 PM

## 2022-04-27 NOTE — Op Note (Signed)
[place] Patient Name: Alyssa Nolan Procedure Date: 04/27/2022 2:41 PM MRN: [id number] Endoscopist: Jerene Bears , MD Age: 81 Referring MD:  Date of Birth: 10/01/1941 Gender: Female Account #: [id number] Procedure:                Colonoscopy Indications:              Iron deficiency anemia; normal colonoscopy in Oct                            2013 Medicines:                Monitored Anesthesia Care Procedure:                Pre-Anesthesia Assessment:                           - Prior to the procedure, a History and Physical                            was performed, and patient medications and                            allergies were reviewed. The patient's tolerance of                            previous anesthesia was also reviewed. The risks                            and benefits of the procedure and the sedation                            options and risks were discussed with the patient.                            All questions were answered, and informed consent                            was obtained. Prior Anticoagulants: The patient has                            taken no previous anticoagulant or antiplatelet                            agents. ASA Grade Assessment: II - A patient with                            mild systemic disease. After reviewing the risks                            and benefits, the patient was deemed in                            satisfactory condition to undergo the procedure.  After obtaining informed consent, the colonoscope                            was passed under direct vision. Throughout the                            procedure, the patient's blood pressure, pulse, and                            oxygen saturations were monitored continuously. The                            Olympus PCF-H190DL (#[id number]) Colonoscope was                            introduced through the anus and advanced to the                             cecum, identified by appendiceal orifice and                            ileocecal valve. The colonoscopy was somewhat                            difficult due to significant looping. Successful                            completion of the procedure was aided by applying                            abdominal pressure. The patient tolerated the                            procedure well. The quality of the bowel                            preparation was good with exception of cecal base                            due to thicker adherent stool which could not be                            completely lavaged. The ileocecal valve,                            appendiceal orifice, and rectum were photographed. Scope In: 3:01:47 PM Scope Out: 3:22:32 PM Scope Withdrawal Time: 0 hours 10 minutes 31 seconds  Total Procedure Duration: 0 hours 20 minutes 45 seconds  Findings:                 The digital rectal exam was normal.                           A few small-mouthed diverticula were found in the  sigmoid colon.                           The exam was otherwise without abnormality on                            direct and retroflexion views. Complications:            No immediate complications. Estimated Blood Loss:     Estimated blood loss: none. Impression:               - Diverticulosis in the sigmoid colon.                           - The examination was otherwise normal on direct                            and retroflexion views.                           - No specimens collected. Recommendation:           - Patient has a contact number available for                            emergencies. The signs and symptoms of potential                            delayed complications were discussed with the                            patient. Return to normal activities tomorrow.                            Written discharge instructions were provided to the                             patient.                           - Resume previous diet.                           - Continue present medications and resume oral iron.                           - Closely monitor Hgb/ferritin to ensure                            normalization.                           - If IDA persistent consider video capsule                            endoscopy.                           -  No repeat colonoscopy due to age and the absence                            of colonic polyps. Jerene Bears, MD 04/27/2022 3:36:34 PM This report has been signed electronically.

## 2022-04-27 NOTE — Op Note (Signed)
[place] Patient Name: Minela Bridgewater Procedure Date: 04/27/2022 2:41 PM MRN: [id number] Endoscopist: Jerene Bears , MD Age: 81 Referring MD:  Date of Birth: 1941-03-10 Gender: Female Account #: [id number] Procedure:                Upper GI endoscopy Indications:              Iron deficiency anemia Medicines:                Monitored Anesthesia Care Procedure:                Pre-Anesthesia Assessment:                           - Prior to the procedure, a History and Physical                            was performed, and patient medications and                            allergies were reviewed. The patient's tolerance of                            previous anesthesia was also reviewed. The risks                            and benefits of the procedure and the sedation                            options and risks were discussed with the patient.                            All questions were answered, and informed consent                            was obtained. Prior Anticoagulants: The patient has                            taken no previous anticoagulant or antiplatelet                            agents. ASA Grade Assessment: II - A patient with                            mild systemic disease. After reviewing the risks                            and benefits, the patient was deemed in                            satisfactory condition to undergo the procedure.                           After obtaining informed consent, the endoscope was  passed under direct vision. Throughout the                            procedure, the patient's blood pressure, pulse, and                            oxygen saturations were monitored continuously. The                            Endoscope was introduced through the mouth, and                            advanced to the second part of duodenum. The upper                            GI endoscopy was accomplished  without difficulty.                            The patient tolerated the procedure well. Scope In: Scope Out: Findings:                 The Z-line was irregular and was found 36 cm from                            the incisors. Biopsies were taken with a cold                            forceps for histology to exclude Barrett's                            esophagus/dysplasia.                           The exam of the esophagus was otherwise normal.                           Multiple 3 to 8 mm pedunculated and sessile polyps                            were found in the gastric body and at the incisura.                            One polyp was the incisura was erythematous but not                            bleeding. This polyp was removed with a cold biopsy                            forceps. Resection and retrieval were complete. The                            other polyps appeared benign and like fundic gland  polyps Biopsies from several different polyps as                            representative sample were taken with a cold                            forceps for histology.                           The examined duodenum was normal. Biopsies for                            histology were taken with a cold forceps for                            evaluation of celiac disease. Complications:            No immediate complications. Estimated Blood Loss:     Estimated blood loss was minimal. Impression:               - Z-line irregular, 36 cm from the incisors.                            Biopsied.                           - Multiple gastric polyps. One (erythematous)                            resected and retrieved. Other sampled with Biopsies.                           - Normal examined duodenum. Biopsied. Recommendation:           - Patient has a contact number available for                            emergencies. The signs and symptoms of potential                             delayed complications were discussed with the                            patient. Return to normal activities tomorrow.                            Written discharge instructions were provided to the                            patient.                           - Resume previous diet.                           - Continue present medications.                           -  Await pathology results.                           - See the other procedure note for documentation of                            additional recommendations. Jerene Bears, MD 04/27/2022 3:33:37 PM This report has been signed electronically.

## 2022-04-27 NOTE — Patient Instructions (Signed)
Resume previous medications resume oral iron.  Biopsies taken.  Await results for final recommendations.    Handouts on findings given to patient (diverticulosis)  YOU HAD AN ENDOSCOPIC PROCEDURE TODAY AT [facility]:   Refer to the procedure report that was given to you for any specific questions about what was found during the examination.  If the procedure report does not answer your questions, please call your gastroenterologist to clarify.  If you requested that your care partner not be given the details of your procedure findings, then the procedure report has been included in a sealed envelope for you to review at your convenience later.  YOU SHOULD EXPECT: Some feelings of bloating in the abdomen. Passage of more gas than usual.  Walking can help get rid of the air that was put into your GI tract during the procedure and reduce the bloating. If you had a lower endoscopy (such as a colonoscopy or flexible sigmoidoscopy) you may notice spotting of blood in your stool or on the toilet paper. If you underwent a bowel prep for your procedure, you may not have a normal bowel movement for a few days.  Please Note:  You might notice some irritation and congestion in your nose or some drainage.  This is from the oxygen used during your procedure.  There is no need for concern and it should clear up in a day or so.  SYMPTOMS TO REPORT IMMEDIATELY:  Following lower endoscopy (colonoscopy or flexible sigmoidoscopy):  Excessive amounts of blood in the stool  Significant tenderness or worsening of abdominal pains  Swelling of the abdomen that is new, acute  Fever of 100F or higher  Following upper endoscopy (EGD)  Vomiting of blood or coffee ground material  New chest pain or pain under the shoulder blades  Painful or persistently difficult swallowing  New shortness of breath  Fever of 100F or higher  Black, tarry-looking stools  For urgent or emergent issues, a  gastroenterologist can be reached at any hour by calling [phone]. Do not use MyChart messaging for urgent concerns.    DIET:  We do recommend a small meal at first, but then you may proceed to your regular diet.  Drink plenty of fluids but you should avoid alcoholic beverages for 24 hours.  ACTIVITY:  You should plan to take it easy for the rest of today and you should NOT DRIVE or use heavy machinery until tomorrow (because of the sedation medicines used during the test).    FOLLOW UP: Our staff will call the number listed on your records 24-72 hours following your procedure to check on you and address any questions or concerns that you may have regarding the information given to you following your procedure. If we do not reach you, we will leave a message.  We will attempt to reach you two times.  During this call, we will ask if you have developed any symptoms of COVID 19. If you develop any symptoms (ie: fever, flu-like symptoms, shortness of breath, cough etc.) before then, please call [phone].  If you test positive for Covid 19 in the 2 weeks post procedure, please call and report this information to [place].    If any biopsies were taken you will be contacted by phone or by letter within the next 1-3 weeks.  Please call us at [phone] if you have not heard about the biopsies in 3 weeks.    SIGNATURES/CONFIDENTIALITY: You and/or your care partner have  signed paperwork which will be entered into your electronic medical record.  These signatures attest to the fact that that the information above on your After Visit Summary has been reviewed and is understood.  Full responsibility of the confidentiality of this discharge information lies with you and/or your care-partner.

## 2022-04-28 ENCOUNTER — Telehealth: Payer: Self-pay | Admitting: *Deleted

## 2022-04-28 ENCOUNTER — Other Ambulatory Visit (INDEPENDENT_AMBULATORY_CARE_PROVIDER_SITE_OTHER): Payer: Medicare Other

## 2022-04-28 DIAGNOSIS — D509 Iron deficiency anemia, unspecified: Secondary | ICD-10-CM

## 2022-04-28 NOTE — Telephone Encounter (Signed)
  Follow up Call-     04/27/2022    2:14 PM  Call back number  Post procedure Call Back phone  # [phone]  Permission to leave phone message Yes     Patient questions:  Do you have a fever, pain , or abdominal swelling? No. Pain Score  0 *  Have you tolerated food without any problems? Yes.    Have you been able to return to your normal activities? Yes.    Do you have any questions about your discharge instructions: Diet   No. Medications  No. Follow up visit  No.  Do you have questions or concerns about your Care? No.  Actions: * If pain score is 4 or above: No action needed, pain <4.

## 2022-04-29 LAB — CBC WITH DIFFERENTIAL/PLATELET
Basophils Absolute: 0.1 10*3/uL (ref 0.0–0.1)
Basophils Relative: 0.8 % (ref 0.0–3.0)
Eosinophils Absolute: 0.1 10*3/uL (ref 0.0–0.7)
Eosinophils Relative: 1.3 % (ref 0.0–5.0)
HCT: 41.3 % (ref 36.0–46.0)
Hemoglobin: 13.5 g/dL (ref 12.0–15.0)
Lymphocytes Relative: 14.9 % (ref 12.0–46.0)
Lymphs Abs: 1.3 10*3/uL (ref 0.7–4.0)
MCHC: 32.6 g/dL (ref 30.0–36.0)
MCV: 80.3 fl (ref 78.0–100.0)
Monocytes Absolute: 0.9 10*3/uL (ref 0.1–1.0)
Monocytes Relative: 10.4 % (ref 3.0–12.0)
Neutro Abs: 6.6 10*3/uL (ref 1.4–7.7)
Neutrophils Relative %: 72.6 % (ref 43.0–77.0)
Platelets: 165 10*3/uL (ref 150.0–400.0)
RBC: 5.14 Mil/uL — ABNORMAL HIGH (ref 3.87–5.11)
RDW: 18.9 % — ABNORMAL HIGH (ref 11.5–15.5)
WBC: 9.1 10*3/uL (ref 4.0–10.5)

## 2022-04-29 LAB — FERRITIN: Ferritin: 9.7 ng/mL — ABNORMAL LOW (ref 10.0–291.0)

## 2022-04-29 LAB — IRON: Iron: 64 ug/dL (ref 42–145)

## 2022-05-01 ENCOUNTER — Other Ambulatory Visit: Payer: Self-pay | Admitting: Family Medicine

## 2022-05-01 ENCOUNTER — Encounter: Payer: Self-pay | Admitting: Family Medicine

## 2022-05-01 DIAGNOSIS — D509 Iron deficiency anemia, unspecified: Secondary | ICD-10-CM

## 2022-05-01 MED ORDER — IRON (FERROUS SULFATE) 325 (65 FE) MG PO TABS: 325.0000 mg | ORAL_TABLET | ORAL | Status: DC

## 2022-05-01 NOTE — Telephone Encounter (Signed)
See result note.  

## 2022-05-06 ENCOUNTER — Encounter: Payer: Self-pay | Admitting: Internal Medicine

## 2022-05-06 DIAGNOSIS — K2270 Barrett's esophagus without dysplasia: Secondary | ICD-10-CM

## 2022-05-13 ENCOUNTER — Encounter: Payer: Medicare Other | Admitting: Obstetrics and Gynecology

## 2022-05-19 ENCOUNTER — Ambulatory Visit: Payer: Medicare Other | Admitting: Gastroenterology

## 2022-05-28 ENCOUNTER — Encounter: Payer: Self-pay | Admitting: Internal Medicine

## 2022-06-02 ENCOUNTER — Other Ambulatory Visit (INDEPENDENT_AMBULATORY_CARE_PROVIDER_SITE_OTHER): Payer: Medicare Other

## 2022-06-02 DIAGNOSIS — D509 Iron deficiency anemia, unspecified: Secondary | ICD-10-CM

## 2022-06-02 LAB — CBC WITH DIFFERENTIAL/PLATELET
Basophils Absolute: 0.1 10*3/uL (ref 0.0–0.1)
Basophils Relative: 0.9 % (ref 0.0–3.0)
Eosinophils Absolute: 0.2 10*3/uL (ref 0.0–0.7)
Eosinophils Relative: 3 % (ref 0.0–5.0)
HCT: 44 % (ref 36.0–46.0)
Hemoglobin: 14.5 g/dL (ref 12.0–15.0)
Lymphocytes Relative: 20.1 % (ref 12.0–46.0)
Lymphs Abs: 1.3 10*3/uL (ref 0.7–4.0)
MCHC: 33 g/dL (ref 30.0–36.0)
MCV: 84.8 fl (ref 78.0–100.0)
Monocytes Absolute: 0.6 10*3/uL (ref 0.1–1.0)
Monocytes Relative: 9.6 % (ref 3.0–12.0)
Neutro Abs: 4.4 10*3/uL (ref 1.4–7.7)
Neutrophils Relative %: 66.4 % (ref 43.0–77.0)
Platelets: 143 10*3/uL — ABNORMAL LOW (ref 150.0–400.0)
RBC: 5.19 Mil/uL — ABNORMAL HIGH (ref 3.87–5.11)
RDW: 18.1 % — ABNORMAL HIGH (ref 11.5–15.5)
WBC: 6.6 10*3/uL (ref 4.0–10.5)

## 2022-06-02 LAB — FERRITIN: Ferritin: 19.3 ng/mL (ref 10.0–291.0)

## 2022-06-02 LAB — IRON: Iron: 297 ug/dL — ABNORMAL HIGH (ref 42–145)

## 2022-06-05 ENCOUNTER — Other Ambulatory Visit: Payer: Self-pay | Admitting: Family Medicine

## 2022-06-05 DIAGNOSIS — D509 Iron deficiency anemia, unspecified: Secondary | ICD-10-CM

## 2022-06-16 ENCOUNTER — Other Ambulatory Visit: Payer: Self-pay | Admitting: Family Medicine

## 2022-06-16 DIAGNOSIS — Z1231 Encounter for screening mammogram for malignant neoplasm of breast: Secondary | ICD-10-CM

## 2022-07-06 ENCOUNTER — Ambulatory Visit (INDEPENDENT_AMBULATORY_CARE_PROVIDER_SITE_OTHER): Payer: Medicare Other | Admitting: Dermatology

## 2022-07-06 DIAGNOSIS — L570 Actinic keratosis: Secondary | ICD-10-CM | POA: Diagnosis not present

## 2022-07-06 DIAGNOSIS — L578 Other skin changes due to chronic exposure to nonionizing radiation: Secondary | ICD-10-CM | POA: Diagnosis not present

## 2022-07-06 DIAGNOSIS — Z1283 Encounter for screening for malignant neoplasm of skin: Secondary | ICD-10-CM | POA: Diagnosis not present

## 2022-07-06 DIAGNOSIS — L821 Other seborrheic keratosis: Secondary | ICD-10-CM

## 2022-07-06 DIAGNOSIS — L814 Other melanin hyperpigmentation: Secondary | ICD-10-CM

## 2022-07-06 DIAGNOSIS — L820 Inflamed seborrheic keratosis: Secondary | ICD-10-CM | POA: Diagnosis not present

## 2022-07-06 DIAGNOSIS — Z85828 Personal history of other malignant neoplasm of skin: Secondary | ICD-10-CM

## 2022-07-06 DIAGNOSIS — D1800 Hemangioma unspecified site: Secondary | ICD-10-CM

## 2022-07-06 DIAGNOSIS — D229 Melanocytic nevi, unspecified: Secondary | ICD-10-CM

## 2022-07-06 NOTE — Progress Notes (Signed)
Follow-Up Visit   Subjective  Alyssa Nolan is a 81 y.o. female who presents for the following: Annual Exam. Hx of BCC, hx of SCC.  The patient presents for Total-Body Skin Exam (TBSE) for skin cancer screening and mole check.  The patient has spots, moles and lesions to be evaluated, some may be new or changing and the patient has concerns that these could be cancer.   The following portions of the chart were reviewed this encounter and updated as appropriate:   Tobacco  Allergies  Meds  Problems  Med Hx  Surg Hx  Fam Hx     Review of Systems:  No other skin or systemic complaints except as noted in HPI or Assessment and Plan.  Objective  Well appearing patient in no apparent distress; mood and affect are within normal limits.  A full examination was performed including scalp, head, eyes, ears, nose, lips, neck, chest, axillae, abdomen, back, buttocks, bilateral upper extremities, bilateral lower extremities, hands, feet, fingers, toes, fingernails, and toenails. All findings within normal limits unless otherwise noted below.  left forehead brow x 2 (2) Erythematous thin papules/macules with gritty scale.   left mandible x 2, right cheek x 2, chest x 6, right leg x 1  (11) (11) Stuck-on, waxy, tan-brown papules and plaques -- Discussed benign etiology and prognosis.    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 They are chronic with expected duration over 1 year. A portion of actinic keratoses will progress to squamous cell carcinoma of the skin. It is not possible to reliably predict which spots will progress to skin cancer and so treatment is recommended to prevent development of skin cancer.  Recommend daily broad spectrum sunscreen SPF 30+ to sun-exposed areas, reapply every 2 hours as needed.  Recommend staying in the  shade or wearing long sleeves, sun glasses (UVA+UVB protection) and wide brim hats (4-inch brim around the entire circumference of the hat). Call for new or changing lesions.   Destruction of lesion - left forehead brow x 2  Inflamed seborrheic keratosis (11) left mandible x 2, right cheek x 2, chest x 6, right leg x 1  (11)  Symptomatic, irritating, patient would like treated.   Destruction of lesion - left mandible x 2, right cheek x 2, chest x 6, right leg x 1  (11) Complexity: simple   Destruction method: cryotherapy   Informed consent: discussed and consent obtained   Timeout:  patient name, date of birth, surgical site, and procedure verified Lesion destroyed using liquid nitrogen: Yes   Region frozen until ice ball extended beyond lesion: Yes   Outcome: patient tolerated procedure well with no complications   Post-procedure details: wound care instructions given    Skin cancer screening  Actinic skin damage  Lentigines - Scattered tan macules - Due to sun exposure - Benign-appearing, observe - Recommend daily broad spectrum sunscreen SPF 30+ to sun-exposed areas, reapply every 2 hours as needed. - Call for any changes  Seborrheic Keratoses - Stuck-on, waxy, tan-brown papules and/or plaques  - Benign-appearing - Discussed benign etiology and prognosis. - Observe - Call for any changes  Melanocytic Nevi - Tan-brown and/or pink-flesh-colored symmetric macules and papules - Benign appearing on exam today - Observation - Call clinic for new or changing moles - Recommend daily use of broad spectrum spf 30+ sunscreen to  sun-exposed areas.   Hemangiomas - Red papules - Discussed benign nature - Observe - Call for any changes  Actinic Damage - Chronic condition, secondary to cumulative UV/sun exposure - diffuse scaly erythematous macules with underlying dyspigmentation - Recommend daily broad spectrum sunscreen SPF 30+ to sun-exposed areas, reapply every 2 hours as  needed.  - Staying in the shade or wearing long sleeves, sun glasses (UVA+UVB protection) and wide brim hats (4-inch brim around the entire circumference of the hat) are also recommended for sun protection.  - Call for new or changing lesions.  History of Basal Cell Carcinoma of the Skin Multiple see history - No evidence of recurrence today - Recommend regular full body skin exams - Recommend daily broad spectrum sunscreen SPF 30+ to sun-exposed areas, reapply every 2 hours as needed.  - Call if any new or changing lesions are noted between office visits   History of Squamous Cell Carcinoma of the Skin Multiple see history - No evidence of recurrence today - No lymphadenopathy - Recommend regular full body skin exams - Recommend daily broad spectrum sunscreen SPF 30+ to sun-exposed areas, reapply every 2 hours as needed.  - Call if any new or changing lesions are noted between office visits  Skin cancer screening performed today.  Return in about 1 year (around 07/07/2023) for TBSE, hx of BCC, hx of SCC.  IMarye Round, CMA, am acting as scribe for Sarina Ser, MD .  Documentation: I have reviewed the above documentation for accuracy and completeness, and I agree with the above.  Sarina Ser, MD

## 2022-07-06 NOTE — Patient Instructions (Addendum)
Cryotherapy Aftercare  Wash gently with soap and water everyday.   Apply Vaseline and Band-Aid daily until healed.     Due to recent changes in healthcare laws, you may see results of your pathology and/or laboratory studies on MyChart before the doctors have had a chance to review them. We understand that in some cases there may be results that are confusing or concerning to you. Please understand that not all results are received at the same time and often the doctors may need to interpret multiple results in order to provide you with the best plan of care or course of treatment. Therefore, we ask that you please give us 2 business days to thoroughly review all your results before contacting the office for clarification. Should we see a critical lab result, you will be contacted sooner.   If You Need Anything After Your Visit  If you have any questions or concerns for your doctor, please call our main line at [phone] and press option 4 to reach your doctor's medical assistant. If no one answers, please leave a voicemail as directed and we will return your call as soon as possible. Messages left after 4 pm will be answered the following business day.   You may also send us a message via MyChart. We typically respond to MyChart messages within 1-2 business days.  For prescription refills, please ask your pharmacy to contact our office. Our fax number is [phone].  If you have an urgent issue when the clinic is closed that cannot wait until the next business day, you can page your doctor at the number below.    Please note that while we do our best to be available for urgent issues outside of office hours, we are not available 24/7.   If you have an urgent issue and are unable to reach us, you may choose to seek medical care at your doctor's office, retail clinic, urgent care center, or emergency room.  If you have a medical emergency, please immediately call 911 or go to the  emergency department.  Pager Numbers  - Dr. Kowalski: [phone]  - Dr. Moye: [phone]  - Dr. Stewart: [phone]  In the event of inclement weather, please call our main line at [phone] for an update on the status of any delays or closures.  Dermatology Medication Tips: Please keep the boxes that topical medications come in in order to help keep track of the instructions about where and how to use these. Pharmacies typically print the medication instructions only on the boxes and not directly on the medication tubes.   If your medication is too expensive, please contact our office at [phone] option 4 or send us a message through MyChart.   We are unable to tell what your co-pay for medications will be in advance as this is different depending on your insurance coverage. However, we may be able to find a substitute medication at lower cost or fill out paperwork to get insurance to cover a needed medication.   If a prior authorization is required to get your medication covered by your insurance company, please allow us 1-2 business days to complete this process.  Drug prices often vary depending on where the prescription is filled and some pharmacies may offer cheaper prices.  The website www.goodrx.com contains coupons for medications through different pharmacies. The prices here do not account for what the cost may be with help from insurance (it may be cheaper with your insurance), but the website can   give you the price if you did not use any insurance.  - You can print the associated coupon and take it with your prescription to the pharmacy.  - You may also stop by our office during regular business hours and pick up a GoodRx coupon card.  - If you need your prescription sent electronically to a different pharmacy, notify our office through [facility] MyChart or by phone at [phone] option 4.     Si Usted Necesita Algo Despus de Su Visita  Tambin puede  enviarnos un mensaje a travs de MyChart. Por lo general respondemos a los mensajes de MyChart en el transcurso de 1 a 2 das hbiles.  Para renovar recetas, por favor pida a su farmacia que se ponga en contacto con nuestra oficina. Nuestro nmero de fax es el [phone].  Si tiene un asunto urgente cuando la clnica est cerrada y que no puede esperar hasta el siguiente da hbil, puede llamar/localizar a su doctor(a) al nmero que aparece a continuacin.   Por favor, tenga en cuenta que aunque hacemos todo lo posible para estar disponibles para asuntos urgentes fuera del horario de oficina, no estamos disponibles las 24 horas del da, los 7 das de la semana.   Si tiene un problema urgente y no puede comunicarse con nosotros, puede optar por buscar atencin mdica  en el consultorio de su doctor(a), en una clnica privada, en un centro de atencin urgente o en una sala de emergencias.  Si tiene una emergencia mdica, por favor llame inmediatamente al 911 o vaya a la sala de emergencias.  Nmeros de bper  - Dr. Kowalski: [phone]  - Dra. Moye: [phone]  - Dra. Stewart: [phone]  En caso de inclemencias del tiempo, por favor llame a nuestra lnea principal al [phone] para una actualizacin sobre el estado de cualquier retraso o cierre.  Consejos para la medicacin en dermatologa: Por favor, guarde las cajas en las que vienen los medicamentos de uso tpico para ayudarle a seguir las instrucciones sobre dnde y cmo usarlos. Las farmacias generalmente imprimen las instrucciones del medicamento slo en las cajas y no directamente en los tubos del medicamento.   Si su medicamento es muy caro, por favor, pngase en contacto con nuestra oficina llamando al [phone] y presione la opcin 4 o envenos un mensaje a travs de MyChart.   No podemos decirle cul ser su copago por los medicamentos por adelantado ya que esto es diferente dependiendo de la cobertura de su seguro.  Sin embargo, es posible que podamos encontrar un medicamento sustituto a menor costo o llenar un formulario para que el seguro cubra el medicamento que se considera necesario.   Si se requiere una autorizacin previa para que su compaa de seguros cubra su medicamento, por favor permtanos de 1 a 2 das hbiles para completar este proceso.  Los precios de los medicamentos varan con frecuencia dependiendo del lugar de dnde se surte la receta y alguna farmacias pueden ofrecer precios ms baratos.  El sitio web www.goodrx.com tiene cupones para medicamentos de diferentes farmacias. Los precios aqu no tienen en cuenta lo que podra costar con la ayuda del seguro (puede ser ms barato con su seguro), pero el sitio web puede darle el precio si no utiliz ningn seguro.  - Puede imprimir el cupn correspondiente y llevarlo con su receta a la farmacia.  - Tambin puede pasar por nuestra oficina durante el horario de atencin regular y recoger una tarjeta de cupones de GoodRx.  -   Si necesita que su receta se enve electrnicamente a una farmacia diferente, informe a nuestra oficina a travs de MyChart de [facility] o por telfono llamando al [phone] y presione la opcin 4.  

## 2022-07-08 ENCOUNTER — Encounter: Payer: Self-pay | Admitting: Dermatology

## 2022-07-08 ENCOUNTER — Other Ambulatory Visit (INDEPENDENT_AMBULATORY_CARE_PROVIDER_SITE_OTHER): Payer: Medicare Other

## 2022-07-08 DIAGNOSIS — D509 Iron deficiency anemia, unspecified: Secondary | ICD-10-CM | POA: Diagnosis not present

## 2022-07-08 LAB — FERRITIN: Ferritin: 12.1 ng/mL (ref 10.0–291.0)

## 2022-07-08 LAB — CBC WITH DIFFERENTIAL/PLATELET
Basophils Absolute: 0.1 10*3/uL (ref 0.0–0.1)
Basophils Relative: 0.9 % (ref 0.0–3.0)
Eosinophils Absolute: 0.3 10*3/uL (ref 0.0–0.7)
Eosinophils Relative: 4 % (ref 0.0–5.0)
HCT: 44.1 % (ref 36.0–46.0)
Hemoglobin: 14.8 g/dL (ref 12.0–15.0)
Lymphocytes Relative: 22.5 % (ref 12.0–46.0)
Lymphs Abs: 1.5 10*3/uL (ref 0.7–4.0)
MCHC: 33.6 g/dL (ref 30.0–36.0)
MCV: 87.6 fl (ref 78.0–100.0)
Monocytes Absolute: 0.6 10*3/uL (ref 0.1–1.0)
Monocytes Relative: 9.7 % (ref 3.0–12.0)
Neutro Abs: 4.2 10*3/uL (ref 1.4–7.7)
Neutrophils Relative %: 62.9 % (ref 43.0–77.0)
Platelets: 153 10*3/uL (ref 150.0–400.0)
RBC: 5.03 Mil/uL (ref 3.87–5.11)
RDW: 17.6 % — ABNORMAL HIGH (ref 11.5–15.5)
WBC: 6.6 10*3/uL (ref 4.0–10.5)

## 2022-07-08 LAB — IRON: Iron: 96 ug/dL (ref 42–145)

## 2022-07-09 ENCOUNTER — Other Ambulatory Visit: Payer: Self-pay | Admitting: Family Medicine

## 2022-07-11 NOTE — Telephone Encounter (Signed)
Refill request for CARISOPRODOL 350 MG TABLET  LOV - 01/31/22 Next OV - not scheduled Last refill - 01/12/22 #90/1

## 2022-07-13 ENCOUNTER — Ambulatory Visit
Admission: RE | Admit: 2022-07-13 | Discharge: 2022-07-13 | Disposition: A | Discharge status: Home / Self Care | Payer: Medicare Other | Source: Ambulatory Visit | Attending: Family Medicine | Admitting: Family Medicine

## 2022-07-13 ENCOUNTER — Encounter: Payer: Self-pay | Admitting: Obstetrics and Gynecology

## 2022-07-13 ENCOUNTER — Ambulatory Visit (INDEPENDENT_AMBULATORY_CARE_PROVIDER_SITE_OTHER): Payer: Medicare Other | Admitting: Obstetrics and Gynecology

## 2022-07-13 VITALS — BP 107/67 | HR 73 | Ht 64.0 in | Wt 158.6 lb

## 2022-07-13 DIAGNOSIS — Z01411 Encounter for gynecological examination (general) (routine) with abnormal findings: Secondary | ICD-10-CM | POA: Diagnosis not present

## 2022-07-13 DIAGNOSIS — L900 Lichen sclerosus et atrophicus: Secondary | ICD-10-CM

## 2022-07-13 DIAGNOSIS — Z01419 Encounter for gynecological examination (general) (routine) without abnormal findings: Secondary | ICD-10-CM

## 2022-07-13 DIAGNOSIS — Z1231 Encounter for screening mammogram for malignant neoplasm of breast: Secondary | ICD-10-CM | POA: Insufficient documentation

## 2022-07-13 NOTE — Progress Notes (Signed)
HPI:      [name] is a 81 y.o. G2P2002 who LMP was No LMP recorded. Patient is postmenopausal.  Subjective:   She presents today for her annual examination.  She has no complaints.  She is using clobetasol intermittently for lichen sclerosus and says it is working. Of significant note, patient has a remote history of breast cancer.    Hx: The following portions of the patient's history were reviewed and updated as appropriate:             She  has a past medical history of Arthritis (2011), Basal cell carcinoma (05/04/2010), Basal cell carcinoma (01/22/2014), Basal cell carcinoma (10/28/2019), Breast cancer ([place]) (04/28/2016), GERD (gastroesophageal reflux disease), History of chickenpox, Hyperlipidemia, Hypertension, Lichen planus, Osteopenia, Personal history of radiation therapy (2017), Skin cancer, Squamous cell carcinoma of skin (03/29/2010), and Squamous cell carcinoma of skin (12/30/2013). She does not have any pertinent problems on file. She  has a past surgical history that includes MRI, neck (06/23/1998  10/31/2003); Cardiovascular stress test (02/07/2002); MRI, hip (01/12/2004); Cesarean section (1972 & 1975); Endometrial ablation and right ovary removal (2002); Right hip injection (03/30/2009); Removal of excess eyelids and shortening of eyelid tendons (01/2010); Removal of squamous and basal cell carcinoma from nose (04/2010); Touch Up of excess eyelids (04/20/2010); Oophorectomy (Bilateral, 15+ YRS AGO); Colonoscopy (04-22-02); Cholecystectomy (2005); Colonoscopy (09-02-12); Excision basal cell carcinoma (01-22-14); Breast surgery (1968); Breast cyst aspiration (Bilateral); Breast lumpectomy with sentinel lymph node bx (Right, 06/10/2016); Breast lumpectomy with needle localization (Right, 06/10/2016); Skin surgery (10/28/2019); Breast excisional biopsy (Right, 1968); Breast biopsy (Right, 2013); Breast biopsy (Right, 04-28-16); and Breast lumpectomy (Right, 2017). Her family history  includes COPD in her mother; Colon cancer in an other family member; Heart disease in her father; Stroke (age of onset: 53) in her maternal grandfather. She  reports that she quit smoking about 52 years ago. Her smoking use included cigarettes. She has never used smokeless tobacco. She reports that she does not currently use alcohol. She reports that she does not use drugs. She has a current medication list which includes the following prescription(s): atorvastatin, calcium carbonate-vitamin d, carisoprodol, clobetasol ointment, diclofenac sodium, letrozole, loratadine, meloxicam, omeprazole, propylene glycol, tramadol-acetaminophen, trazodone, triamterene-hydrochlorothiazide, and iron (ferrous sulfate). She is allergic to codeine.       Review of Systems:  Review of Systems  Constitutional: Denied constitutional symptoms, night sweats, recent illness, fatigue, fever, insomnia and weight loss.  Eyes: Denied eye symptoms, eye pain, photophobia, vision change and visual disturbance.  Ears/Nose/Throat/Neck: Denied ear, nose, throat or neck symptoms, hearing loss, nasal discharge, sinus congestion and sore throat.  Cardiovascular: Denied cardiovascular symptoms, arrhythmia, chest pain/pressure, edema, exercise intolerance, orthopnea and palpitations.  Respiratory: Denied pulmonary symptoms, asthma, pleuritic pain, productive sputum, cough, dyspnea and wheezing.  Gastrointestinal: Denied, gastro-esophageal reflux, melena, nausea and vomiting.  Genitourinary: Denied genitourinary symptoms including symptomatic vaginal discharge, pelvic relaxation issues, and urinary complaints.  Musculoskeletal: Denied musculoskeletal symptoms, stiffness, swelling, muscle weakness and myalgia.  Dermatologic: Denied dermatology symptoms, rash and scar.  Neurologic: Denied neurology symptoms, dizziness, headache, neck pain and syncope.  Psychiatric: Denied psychiatric symptoms, anxiety and depression.  Endocrine: Denied  endocrine symptoms including hot flashes and night sweats.   Meds:   Current Outpatient Medications on File Prior to Visit  Medication Sig Dispense Refill   atorvastatin (LIPITOR) 20 MG tablet TAKE 1 TABLET BY MOUTH EVERY DAY 90 tablet 3   Calcium Carbonate-Vitamin D 600-400 MG-UNIT tablet Take 1 tablet by  mouth 2 (two) times daily.     carisoprodol (SOMA) 350 MG tablet TAKE 1 TABLET BY MOUTH EVERYDAY AT BEDTIME 90 tablet 1   clobetasol ointment (TEMOVATE) 1.60 % Apply 1 application. topically every other day. 30 g 2   diclofenac Sodium (VOLTAREN) 1 % GEL APPLY 2 GRAMS TOPICALLY 4 (FOUR) TIMES DAILY AS NEEDED. 100 g 5   letrozole (FEMARA) 2.5 MG tablet TAKE 1 TABLET (2.5 MG TOTAL) BY MOUTH DAILY. 90 tablet 3   loratadine (CLARITIN) 10 MG tablet Take 10 mg by mouth daily.     meloxicam (MOBIC) 7.5 MG tablet Take 7.5 mg by mouth 2 (two) times daily.     omeprazole (PRILOSEC) 20 MG capsule TAKE 1 CAPSULE BY MOUTH EVERY DAY 90 capsule 3   Propylene Glycol (SYSTANE BALANCE OP) Place 1 drop into both eyes daily.     traMADol-acetaminophen (ULTRACET) 37.5-325 MG tablet TAKE 2 TABLETS BY MOUTH AT BEDTIME 180 tablet 1   traZODone (DESYREL) 100 MG tablet 1.5 tabs by mouth at night for sleep. 135 tablet 3   triamterene-hydrochlorothiazide (DYAZIDE) 37.5-25 MG capsule TAKE 1 CAPSULE BY MOUTH EVERY DAY IN THE MORNING 90 capsule 3   Iron, Ferrous Sulfate, 325 (65 Fe) MG TABS Take 325 mg by mouth every other day. (Patient not taking: Reported on 07/13/2022) 30 tablet    No current facility-administered medications on file prior to visit.     Objective:     Vitals:   07/13/22 1437  BP: 107/67  Pulse: 73    Filed Weights   07/13/22 1437  Weight: 158 lb 9.6 oz (71.9 kg)              Physical examination General NAD, Conversant  HEENT Atraumatic; Op clear with mmm.  Normo-cephalic. Pupils reactive. Anicteric sclerae  Thyroid/Neck Smooth without nodularity or enlargement. Normal ROM.  Neck  Supple.  Skin No rashes, lesions or ulceration. Normal palpated skin turgor. No nodularity.  Breasts: No masses or discharge.  Symmetric.  No axillary adenopathy.  Lungs: Clear to auscultation.No rales or wheezes. Normal Respiratory effort, no retractions.  Heart: NSR.  No murmurs or rubs appreciated. No periferal edema  Abdomen: Soft.  Non-tender.  No masses.  No HSM. No hernia  Extremities: Moves all appropriately.  Normal ROM for age. No lymphadenopathy.  Neuro: Oriented to PPT.  Normal mood. Normal affect.     Pelvic:   Vulva: Normal appearance.  No lesions.  Lichen sclerosus noted around posterior fourchette and some near the clitoral area.  Vagina: No lesions or abnormalities noted.  Support: Normal pelvic support.  Urethra No masses tenderness or scarring.  Meatus Normal size without lesions or prolapse.  Cervix: Normal appearance.  No lesions.  Anus: Normal exam.  No lesions.  Perineum: Normal exam.  No lesions.        Bimanual   Uterus: Normal size.  Non-tender.  Mobile.  AV.  Adnexae: No masses.  Non-tender to palpation.  Cul-de-sac: Negative for abnormality.     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 Well woman exam with routine gynecological exam   2. Lichen sclerosus     Patient treating herself with clobetasol and doing well.   Plan:            1.  Basic Screening Recommendations The basic screening recommendations for asymptomatic women were discussed with the patient during her visit.  The age-appropriate recommendations were discussed with her and the rational for the tests reviewed.  When I am informed by the patient that another primary care physician has previously obtained the age-appropriate tests and they are up-to-date, only outstanding tests are ordered and referrals given as necessary.  Abnormal results of tests will be discussed with her when all of her results are completed.  Routine preventative health maintenance measures emphasized: Exercise/Diet/Weight control, Tobacco Warnings, Alcohol/Substance use risks and Stress Management  Orders No orders of the defined types were placed in this encounter.   No orders of the defined types were placed in this encounter.        F/U  No follow-ups on file.  Finis Bud, M.D. 07/13/2022 3:18 PM

## 2022-07-13 NOTE — Progress Notes (Signed)
Patients presents for annual exam today. She recently had a birthday earlier this week! She states she continues to use her Clobetasol RX as needed for her lichen sclerosis. Patient has aged out of pap smears and mammograms. Annual labs declined. Patient states no other questions or concerns at this time.

## 2022-07-19 ENCOUNTER — Telehealth: Payer: Self-pay

## 2022-07-19 NOTE — Telephone Encounter (Signed)
Called and lvm for patient call us back. 

## 2022-07-19 NOTE — Telephone Encounter (Signed)
-----   Message from Tonia Ghent, MD sent at 07/18/2022  2:42 PM EDT ----- Please notify pt.  Normal mammogram. Thanks.

## 2022-07-23 ENCOUNTER — Other Ambulatory Visit: Payer: Self-pay | Admitting: Family Medicine

## 2022-07-24 ENCOUNTER — Encounter: Payer: Self-pay | Admitting: Family Medicine

## 2022-07-25 NOTE — Telephone Encounter (Signed)
Refill request for TRAMADOL-ACETAMINOPHN 37.5-325  LOV - 01/31/22 Next OV - not scheduled Last refill - 01/26/22 #180/1

## 2022-07-28 DIAGNOSIS — C50411 Malignant neoplasm of upper-outer quadrant of right female breast: Secondary | ICD-10-CM | POA: Diagnosis not present

## 2022-07-28 DIAGNOSIS — Z170 Estrogen receptor positive status [ER+]: Secondary | ICD-10-CM | POA: Diagnosis not present

## 2022-08-05 ENCOUNTER — Encounter: Payer: Self-pay | Admitting: Family Medicine

## 2022-08-31 ENCOUNTER — Encounter: Payer: Self-pay | Admitting: Family Medicine

## 2022-09-02 DIAGNOSIS — Z23 Encounter for immunization: Secondary | ICD-10-CM | POA: Diagnosis not present

## 2022-12-14 DIAGNOSIS — M48061 Spinal stenosis, lumbar region without neurogenic claudication: Secondary | ICD-10-CM | POA: Diagnosis not present

## 2022-12-14 DIAGNOSIS — M5136 Other intervertebral disc degeneration, lumbar region: Secondary | ICD-10-CM | POA: Diagnosis not present

## 2022-12-14 DIAGNOSIS — M5416 Radiculopathy, lumbar region: Secondary | ICD-10-CM | POA: Diagnosis not present

## 2022-12-18 ENCOUNTER — Other Ambulatory Visit: Payer: Self-pay | Admitting: Gastroenterology

## 2023-01-05 ENCOUNTER — Telehealth: Payer: Self-pay | Admitting: Family Medicine

## 2023-01-05 NOTE — Telephone Encounter (Signed)
Contacted [facility] to schedule their annual wellness visit. Appointment made for 02/07/2023.  [facility] Direct Dial: [phone]

## 2023-01-06 ENCOUNTER — Other Ambulatory Visit: Payer: Self-pay | Admitting: Family Medicine

## 2023-01-06 NOTE — Telephone Encounter (Signed)
Refill request for carisoprodol (SOMA) 350 MG tablet   LOV - 01/31/22 Next OV - 02/09/23 Last refill - 07/12/22 #90/1

## 2023-01-08 NOTE — Telephone Encounter (Signed)
Sent. Thanks.   

## 2023-01-17 ENCOUNTER — Other Ambulatory Visit: Payer: Self-pay | Admitting: Family Medicine

## 2023-01-18 NOTE — Telephone Encounter (Signed)
Refill request for TRAMADOL-ACETAMINOPHN 37.5-325   LOV - 01/31/22 Next OV - 02/09/23 Last refill - 07/26/22 #180/1

## 2023-01-18 NOTE — Telephone Encounter (Signed)
Sent. Thanks.   

## 2023-01-21 ENCOUNTER — Other Ambulatory Visit: Payer: Self-pay | Admitting: Family Medicine

## 2023-01-21 DIAGNOSIS — E559 Vitamin D deficiency, unspecified: Secondary | ICD-10-CM

## 2023-01-21 DIAGNOSIS — D509 Iron deficiency anemia, unspecified: Secondary | ICD-10-CM

## 2023-01-21 DIAGNOSIS — E785 Hyperlipidemia, unspecified: Secondary | ICD-10-CM

## 2023-01-25 ENCOUNTER — Other Ambulatory Visit: Payer: Self-pay | Admitting: Family Medicine

## 2023-02-02 ENCOUNTER — Other Ambulatory Visit: Payer: Self-pay | Admitting: Family Medicine

## 2023-02-03 ENCOUNTER — Other Ambulatory Visit (INDEPENDENT_AMBULATORY_CARE_PROVIDER_SITE_OTHER): Payer: Medicare Other

## 2023-02-03 DIAGNOSIS — D509 Iron deficiency anemia, unspecified: Secondary | ICD-10-CM

## 2023-02-03 DIAGNOSIS — E785 Hyperlipidemia, unspecified: Secondary | ICD-10-CM

## 2023-02-03 DIAGNOSIS — E559 Vitamin D deficiency, unspecified: Secondary | ICD-10-CM

## 2023-02-03 LAB — CBC WITH DIFFERENTIAL/PLATELET
Basophils Absolute: 0.1 10*3/uL (ref 0.0–0.1)
Basophils Relative: 0.8 % (ref 0.0–3.0)
Eosinophils Absolute: 0.2 10*3/uL (ref 0.0–0.7)
Eosinophils Relative: 2.6 % (ref 0.0–5.0)
HCT: 47.3 % — ABNORMAL HIGH (ref 36.0–46.0)
Hemoglobin: 16.1 g/dL — ABNORMAL HIGH (ref 12.0–15.0)
Lymphocytes Relative: 22.5 % (ref 12.0–46.0)
Lymphs Abs: 1.5 10*3/uL (ref 0.7–4.0)
MCHC: 34.1 g/dL (ref 30.0–36.0)
MCV: 92.9 fl (ref 78.0–100.0)
Monocytes Absolute: 0.7 10*3/uL (ref 0.1–1.0)
Monocytes Relative: 10.2 % (ref 3.0–12.0)
Neutro Abs: 4.1 10*3/uL (ref 1.4–7.7)
Neutrophils Relative %: 63.9 % (ref 43.0–77.0)
Platelets: 161 10*3/uL (ref 150.0–400.0)
RBC: 5.09 Mil/uL (ref 3.87–5.11)
RDW: 13.4 % (ref 11.5–15.5)
WBC: 6.5 10*3/uL (ref 4.0–10.5)

## 2023-02-03 LAB — IRON: Iron: 146 ug/dL — ABNORMAL HIGH (ref 42–145)

## 2023-02-03 LAB — COMPREHENSIVE METABOLIC PANEL
ALT: 12 U/L (ref 0–35)
AST: 18 U/L (ref 0–37)
Albumin: 4 g/dL (ref 3.5–5.2)
Alkaline Phosphatase: 83 U/L (ref 39–117)
BUN: 13 mg/dL (ref 6–23)
CO2: 34 mEq/L — ABNORMAL HIGH (ref 19–32)
Calcium: 10.2 mg/dL (ref 8.4–10.5)
Chloride: 101 mEq/L (ref 96–112)
Creatinine, Ser: 0.84 mg/dL (ref 0.40–1.20)
GFR: 66.03 mL/min (ref >60.00)
Glucose, Bld: 88 mg/dL (ref 70–99)
Potassium: 4.2 mEq/L (ref 3.5–5.1)
Sodium: 141 mEq/L (ref 135–145)
Total Bilirubin: 0.7 mg/dL (ref 0.2–1.2)
Total Protein: 6.6 g/dL (ref 6.0–8.3)

## 2023-02-03 LAB — FERRITIN: Ferritin: 9.4 ng/mL — ABNORMAL LOW (ref 10.0–291.0)

## 2023-02-03 LAB — LIPID PANEL
Cholesterol: 203 mg/dL — ABNORMAL HIGH (ref 0–200)
HDL: 48.9 mg/dL (ref >39.00)
LDL Cholesterol: 122 mg/dL — ABNORMAL HIGH (ref 0–99)
NonHDL: 154.59
Total CHOL/HDL Ratio: 4
Triglycerides: 164 mg/dL — ABNORMAL HIGH (ref 0.0–149.0)
VLDL: 32.8 mg/dL (ref 0.0–40.0)

## 2023-02-03 LAB — VITAMIN D 25 HYDROXY (VIT D DEFICIENCY, FRACTURES): VITD: 38.19 ng/mL (ref 30.00–100.00)

## 2023-02-06 ENCOUNTER — Telehealth: Payer: Self-pay

## 2023-02-06 NOTE — Telephone Encounter (Signed)
PA is needed for Carisoprodol 350 mg tablets. Please start PA for patient.

## 2023-02-07 ENCOUNTER — Ambulatory Visit (INDEPENDENT_AMBULATORY_CARE_PROVIDER_SITE_OTHER): Payer: Medicare Other

## 2023-02-07 VITALS — Ht 64.0 in | Wt 160.0 lb

## 2023-02-07 DIAGNOSIS — Z0000 Encounter for general adult medical examination without abnormal findings: Secondary | ICD-10-CM | POA: Diagnosis not present

## 2023-02-07 DIAGNOSIS — Z780 Asymptomatic menopausal state: Secondary | ICD-10-CM | POA: Diagnosis not present

## 2023-02-07 NOTE — Patient Instructions (Signed)
Alyssa Nolan , Thank you for taking time to come for your Medicare Wellness Visit. I appreciate your ongoing commitment to your health goals. Please review the following plan we discussed and let me know if I can assist you in the future.   These are the goals we discussed:  Goals      Increase physical activity     Starting 12/05/2018, I will continue to exercise for at least 60 minutes 3 days per week.      Patient Stated     12/09/2019, I will maintain and continue medications as prescribed.     Patient Stated     Eat less sweets and lose 5 lb.        This is a list of the screening recommended for you and due dates:  Health Maintenance  Topic Date Due   Hepatitis C Screening: USPSTF Recommendation to screen - Ages 74-79 yo.  Never done   COVID-19 Vaccine (7 - 2023-24 season) 10/22/2022   Medicare Annual Wellness Visit  02/07/2024   DTaP/Tdap/Td vaccine (3 - Td or Tdap) 02/08/2032   Pneumonia Vaccine  Completed   Flu Shot  Completed   DEXA scan (bone density measurement)  Completed   Zoster (Shingles) Vaccine  Completed   HPV Vaccine  Aged Out   Colon Cancer Screening  Discontinued    Advanced directives: Please bring a copy of your health care power of attorney and living will to the office to be added to your chart at your convenience.   Conditions/risks identified: none  Next appointment: Follow up in one year for your annual wellness visit 02/08/24 @ 1:45 telephone visit   Preventive Care 65 Years and Older, Female Preventive care refers to lifestyle choices and visits with your health care provider that can promote health and wellness. What does preventive care include? A yearly physical exam. This is also called an annual well check. Dental exams once or twice a year. Routine eye exams. Ask your health care provider how often you should have your eyes checked. Personal lifestyle choices, including: Daily care of your teeth and gums. Regular physical  activity. Eating a healthy diet. Avoiding tobacco and drug use. Limiting alcohol use. Practicing safe sex. Taking low-dose aspirin every day. Taking vitamin and mineral supplements as recommended by your health care provider. What happens during an annual well check? The services and screenings done by your health care provider during your annual well check will depend on your age, overall health, lifestyle risk factors, and family history of disease. Counseling  Your health care provider may ask you questions about your: Alcohol use. Tobacco use. Drug use. Emotional well-being. Home and relationship well-being. Sexual activity. Eating habits. History of falls. Memory and ability to understand (cognition). Work and work Statistician. Reproductive health. Screening  You may have the following tests or measurements: Height, weight, and BMI. Blood pressure. Lipid and cholesterol levels. These may be checked every 5 years, or more frequently if you are over 65 years old. Skin check. Lung cancer screening. You may have this screening every year starting at age 20 if you have a 30-pack-year history of smoking and currently smoke or have quit within the past 15 years. Fecal occult blood test (FOBT) of the stool. You may have this test every year starting at age 71. Flexible sigmoidoscopy or colonoscopy. You may have a sigmoidoscopy every 5 years or a colonoscopy every 10 years starting at age 46. Hepatitis C blood test. Hepatitis B blood test.  Sexually transmitted disease (STD) testing. Diabetes screening. This is done by checking your blood sugar (glucose) after you have not eaten for a while (fasting). You may have this done every 1-3 years. Bone density scan. This is done to screen for osteoporosis. You may have this done starting at age 61. Mammogram. This may be done every 1-2 years. Talk to your health care provider about how often you should have regular mammograms. Talk with your  health care provider about your test results, treatment options, and if necessary, the need for more tests. Vaccines  Your health care provider may recommend certain vaccines, such as: Influenza vaccine. This is recommended every year. Tetanus, diphtheria, and acellular pertussis (Tdap, Td) vaccine. You may need a Td booster every 10 years. Zoster vaccine. You may need this after age 19. Pneumococcal 13-valent conjugate (PCV13) vaccine. One dose is recommended after age 60. Pneumococcal polysaccharide (PPSV23) vaccine. One dose is recommended after age 66. Talk to your health care provider about which screenings and vaccines you need and how often you need them. This information is not intended to replace advice given to you by your health care provider. Make sure you discuss any questions you have with your health care provider. Document Released: 11/27/2015 Document Revised: 07/20/2016 Document Reviewed: 09/01/2015 Elsevier Interactive Patient Education  2017 [place] Prevention in the Home Falls can cause injuries. They can happen to people of all ages. There are many things you can do to make your home safe and to help prevent falls. What can I do on the outside of my home? Regularly fix the edges of walkways and driveways and fix any cracks. Remove anything that might make you trip as you walk through a door, such as a raised step or threshold. Trim any bushes or trees on the path to your home. Use bright outdoor lighting. Clear any walking paths of anything that might make someone trip, such as rocks or tools. Regularly check to see if handrails are loose or broken. Make sure that both sides of any steps have handrails. Any raised decks and porches should have guardrails on the edges. Have any leaves, snow, or ice cleared regularly. Use sand or salt on walking paths during winter. Clean up any spills in your garage right away. This includes oil or grease spills. What can I  do in the bathroom? Use night lights. Install grab bars by the toilet and in the tub and shower. Do not use towel bars as grab bars. Use non-skid mats or decals in the tub or shower. If you need to sit down in the shower, use a plastic, non-slip stool. Keep the floor dry. Clean up any water that spills on the floor as soon as it happens. Remove soap buildup in the tub or shower regularly. Attach bath mats securely with double-sided non-slip rug tape. Do not have throw rugs and other things on the floor that can make you trip. What can I do in the bedroom? Use night lights. Make sure that you have a light by your bed that is easy to reach. Do not use any sheets or blankets that are too big for your bed. They should not hang down onto the floor. Have a firm chair that has side arms. You can use this for support while you get dressed. Do not have throw rugs and other things on the floor that can make you trip. What can I do in the kitchen? Clean up any spills right away.  Avoid walking on wet floors. Keep items that you use a lot in easy-to-reach places. If you need to reach something above you, use a strong step stool that has a grab bar. Keep electrical cords out of the way. Do not use floor polish or wax that makes floors slippery. If you must use wax, use non-skid floor wax. Do not have throw rugs and other things on the floor that can make you trip. What can I do with my stairs? Do not leave any items on the stairs. Make sure that there are handrails on both sides of the stairs and use them. Fix handrails that are broken or loose. Make sure that handrails are as long as the stairways. Check any carpeting to make sure that it is firmly attached to the stairs. Fix any carpet that is loose or worn. Avoid having throw rugs at the top or bottom of the stairs. If you do have throw rugs, attach them to the floor with carpet tape. Make sure that you have a light switch at the top of the stairs  and the bottom of the stairs. If you do not have them, ask someone to add them for you. What else can I do to help prevent falls? Wear shoes that: Do not have high heels. Have rubber bottoms. Are comfortable and fit you well. Are closed at the toe. Do not wear sandals. If you use a stepladder: Make sure that it is fully opened. Do not climb a closed stepladder. Make sure that both sides of the stepladder are locked into place. Ask someone to hold it for you, if possible. Clearly mark and make sure that you can see: Any grab bars or handrails. First and last steps. Where the edge of each step is. Use tools that help you move around (mobility aids) if they are needed. These include: Canes. Walkers. Scooters. Crutches. Turn on the lights when you go into a dark area. Replace any light bulbs as soon as they burn out. Set up your furniture so you have a clear path. Avoid moving your furniture around. If any of your floors are uneven, fix them. If there are any pets around you, be aware of where they are. Review your medicines with your doctor. Some medicines can make you feel dizzy. This can increase your chance of falling. Ask your doctor what other things that you can do to help prevent falls. This information is not intended to replace advice given to you by your health care provider. Make sure you discuss any questions you have with your health care provider. Document Released: 08/27/2009 Document Revised: 04/07/2016 Document Reviewed: 12/05/2014 Elsevier Interactive Patient Education  2017 [employer].

## 2023-02-07 NOTE — Progress Notes (Signed)
Subjective:   Alyssa Nolan is a 82 y.o. female who presents for Medicare Annual (Subsequent) preventive examination.  Review of Systems      Cardiac Risk Factors include: advanced age (>14men, >39 women);hypertension     Objective:    Today's Vitals   02/07/23 1020  Weight: 160 lb (72.6 kg)  Height: 5\' 4"  (1.626 m)   Body mass index is 27.46 kg/m.     02/07/2023   10:36 AM 10/20/2020    1:44 PM 12/09/2019   11:55 AM 09/25/2019    1:37 PM 12/05/2018    1:25 PM 09/26/2018    2:49 PM 09/20/2017    2:24 PM  Advanced Directives  Does Patient Have a Medical Advance Directive? Yes Yes Yes No Yes No No  Type of Paramedic of [place];Living will [place];Living will [place];Living will  [place];Living will    Does patient want to make changes to medical advance directive?  No - Patient declined       Copy of [place] in Chart? No - copy requested No - copy requested No - copy requested  No - copy requested    Would patient like information on creating a medical advance directive?    No - Patient declined No - Patient declined No - Patient declined No - Patient declined    Current Medications (verified) Outpatient Encounter Medications as of 02/07/2023  Medication Sig   atorvastatin (LIPITOR) 20 MG tablet TAKE 1 TABLET BY MOUTH EVERY DAY   Calcium Carbonate-Vitamin D 600-400 MG-UNIT tablet Take 1 tablet by mouth 2 (two) times daily.   carisoprodol (SOMA) 350 MG tablet TAKE 1 TABLET BY MOUTH EVERYDAY AT BEDTIME   clobetasol ointment (TEMOVATE) [id number] % Apply 1 application. topically every other day.   letrozole (FEMARA) 2.5 MG tablet TAKE 1 TABLET (2.5 MG TOTAL) BY MOUTH DAILY.   loratadine (CLARITIN) 10 MG tablet Take 10 mg by mouth daily.   meloxicam (MOBIC) 7.5 MG tablet Take 7.5 mg by mouth 2 (two) times daily.   mometasone (ELOCON) 0.1 % lotion APPLY TO ITCHY EARS  TWICE DAILY FOR 12 DAYS. MAY REPEAT AS NEEDED FOR ITCHY EARS   omeprazole (PRILOSEC) 20 MG capsule TAKE 1 CAPSULE BY MOUTH EVERY DAY   OVER THE COUNTER MEDICATION Essential oil blend   polyethylene glycol powder (GLYCOLAX/MIRALAX) 17 GM/SCOOP powder Take 1 Container by mouth once.   Propylene Glycol (SYSTANE BALANCE OP) Place 1 drop into both eyes daily.   traMADol-acetaminophen (ULTRACET) 37.5-325 MG tablet TAKE 2 TABLETS BY MOUTH AT BEDTIME   traZODone (DESYREL) 100 MG tablet 1.5 tabs by mouth at night for sleep.   triamterene-hydrochlorothiazide (DYAZIDE) 37.5-25 MG capsule TAKE 1 CAPSULE BY MOUTH EVERY DAY IN THE MORNING   diclofenac Sodium (VOLTAREN) 1 % GEL APPLY 2 GRAMS TOPICALLY 4 (FOUR) TIMES DAILY AS NEEDED. (Patient not taking: Reported on 02/07/2023)   Iron, Ferrous Sulfate, 325 (65 Fe) MG TABS Take 325 mg by mouth every other day. (Patient not taking: Reported on 07/13/2022)   No facility-administered encounter medications on file as of 02/07/2023.    Allergies (verified) Codeine   History: Past Medical History:  Diagnosis Date   Arthritis 2011   R hip injection per Dr. Nelva Bush   Basal cell carcinoma 05/04/2010   ant and sup edge of left nose and nasal alar crease   Basal cell carcinoma 01/22/2014   right chest/excision  Basal cell carcinoma 10/28/2019   right crease nasal ala   Breast cancer ([place]) 04/28/2016   With plan to be on letrozole until 2027.  T1c, N0; ER/PR positive, HER-2/neu negative. INVASIVE DUCTAL CARCINOMA., DCIS   GERD (gastroesophageal reflux disease)    History of chickenpox    Hyperlipidemia    Hypertension    Lichen planus    Osteopenia    Tscore -1, consider repeat in ~2013, per Dr. Amalia Hailey with gyn   Personal history of radiation therapy 2017   MammoSite   Skin cancer    BCC, SCC on nose   Squamous cell carcinoma of skin 03/29/2010   left nose ant alar crease/in situ   Squamous cell carcinoma of skin 12/30/2013   left prox zygoma/in situ    Past Surgical History:  Procedure Laterality Date   BASAL CELL CARCINOMA EXCISION  01-22-14   chest area. Dr Nehemiah Massed   BREAST BIOPSY Right 2013   CORE - NEG, cyst with ductal hyperplasia without atypia Dr Bary Castilla   BREAST BIOPSY Right 04-28-16   Invasive ductal carcinoma   BREAST CYST ASPIRATION Bilateral    BREAST EXCISIONAL BIOPSY Right 1968   NEG   BREAST LUMPECTOMY Right 2017   IMC/DCIS, negative LNs   BREAST LUMPECTOMY WITH NEEDLE LOCALIZATION Right 06/10/2016   Procedure: BREAST LUMPECTOMY WITH NEEDLE LOCALIZATION;  Surgeon: Robert Bellow, MD;  Location: [facility] ORS;  Service: General;  Laterality: Right;   BREAST LUMPECTOMY WITH SENTINEL LYMPH NODE BIOPSY Right 06/10/2016   Procedure: BREAST LUMPECTOMY WITH SENTINEL LYMPH NODE BX / WITH RECONSTRUCTION;  Surgeon: Robert Bellow, MD;  Location: [facility] ORS;  Service: General;  Laterality: Right;   BREAST SURGERY  1968   Fibroadenoma   CARDIOVASCULAR STRESS TEST  02/07/2002   CESAREAN SECTION  1972 & 1975   CHOLECYSTECTOMY  2005   Dr Helane Rima, [place]   COLONOSCOPY  04-22-02   Dr Ricarda Frame, [place]   COLONOSCOPY  09-02-12   Dr Bary Castilla   Endometrial ablation and right ovary removal  2002   MRI, hip  01/12/2004   MRI, neck  06/23/1998  10/31/2003   OOPHORECTOMY Bilateral 15+ YRS AGO   Removal of excess eyelids and shortening of eyelid tendons  01/2010   Removal of squamous and basal cell carcinoma from nose  04/2010   Right hip injection  03/30/2009   SKIN SURGERY  10/28/2019   removal of basal cell   Touch Up of excess eyelids  04/20/2010   Family History  Problem Relation Age of Onset   COPD Mother        Smoker   Heart disease Father        Possible CHF   Stroke Maternal Grandfather 83       CVA   Colon cancer Other        mother's 1/2 sister   Breast cancer Neg Hx    Diabetes Neg Hx    Liver disease Neg Hx    Pancreatic cancer Neg Hx    Esophageal cancer Neg Hx    Stomach cancer Neg Hx    Social History   Socioeconomic  History   Marital status: Widowed    Spouse name: Not on file   Number of children: 2   Years of education: Not on file   Highest education level: Bachelor's degree (e.g., BA, AB, BS)  Occupational History   Occupation: Retired since 2007 and moved to [employer]    Employer: RETIRED  Tobacco Use  Smoking status: Former    Types: Cigarettes    Quit date: 11/14/1969    Years since quitting: 53.2   Smokeless tobacco: Never  Vaping Use   Vaping Use: Never used  Substance and Sexual Activity   Alcohol use: Not Currently   Drug use: No   Sexual activity: Not Currently    Birth control/protection: Post-menopausal  Other Topics Concern   Not on file  Social History Narrative   From [place]:  [employer] grad   2 kids in [place] and likes to [employer]   Social Determinants of Health   Financial Resource Strain: Low Risk  (02/07/2023)   Overall Financial Resource Strain (CARDIA)    Difficulty of Paying Living Expenses: Not hard at all  Food Insecurity: No Food Insecurity (02/07/2023)   Hunger Vital Sign    Worried About Running Out of Food in the Last Year: Never true    Ran Out of Food in the Last Year: Never true  Transportation Needs: No Transportation Needs (02/07/2023)   PRAPARE - Hydrologist (Medical): No    Lack of Transportation (Non-Medical): No  Physical Activity: Sufficiently Active (02/07/2023)   Exercise Vital Sign    Days of Exercise per Week: 3 days    Minutes of Exercise per Session: 50 min  Recent Concern: Physical Activity - Insufficiently Active (02/05/2023)   Exercise Vital Sign    Days of Exercise per Week: 3 days    Minutes of Exercise per Session: 40 min  Stress: No Stress Concern Present (02/07/2023)   [place]    Feeling of Stress : Not at all  Social Connections: Moderately Isolated (02/07/2023)   Social Connection and Isolation Panel  [NHANES]    Frequency of Communication with Friends and Family: Once a week    Frequency of Social Gatherings with Friends and Family: More than three times a week    Attends Religious Services: More than 4 times per year    Active Member of [employer] or Organizations: No    Attends Archivist Meetings: Never    Marital Status: Widowed    Tobacco Counseling Counseling given: Not Answered   Clinical Intake:  Pre-visit preparation completed: Yes  Pain : No/denies pain     Nutritional Risks: None Diabetes: No  How often do you need to have someone help you when you read instructions, pamphlets, or other written materials from your doctor or pharmacy?: 1 - Never  Diabetic? no  Interpreter Needed?: No  Information entered by :: C.Nial Hawe LPN   Activities of Daily Living    02/07/2023   10:37 AM  In your present state of health, do you have any difficulty performing the following activities:  Hearing? 0  Vision? 0  Difficulty concentrating or making decisions? 1  Comment Struggles to make decisions  Walking or climbing stairs? 0  Dressing or bathing? 0  Doing errands, shopping? 0  Preparing Food and eating ? N  Using the Toilet? N  In the past six months, have you accidently leaked urine? Y  Comment occasionally nothing bothersome  Do you have problems with loss of bowel control? N  Managing your Medications? N  Managing your Finances? N  Housekeeping or managing your Housekeeping? N    Patient Care Team: Tonia Ghent, MD as PCP - General Defrancesco, Alanda Slim, MD as Referring Physician (Obstetrics and Gynecology) Page,  Forest Gleason, MD as Consulting Physician (General Surgery) Ralene Bathe, MD as Consulting Physician (Dermatology) Latanya Maudlin, MD as Consulting Physician (Orthopedic Surgery) Arelia Sneddon, [place] as Consulting Physician (Optometry)  Indicate any recent [place] you may have received from other than [facility] providers in  the past year (date may be approximate).     Assessment:   This is a routine wellness examination for [place].  Hearing/Vision screen Hearing Screening - Comments:: Wears aids Vision Screening - Comments:: Glasses - [facility]  Dietary issues and exercise activities discussed: Current Exercise Habits: Structured exercise class, Type of exercise: Other - see comments Psychologist, educational areobics), Time (Minutes): 45, Frequency (Times/Week): 3, Weekly Exercise (Minutes/Week): 135, Intensity: Moderate, Exercise limited by: None identified   Goals Addressed             This Visit's Progress    Patient Stated       Eat less sweets and lose 5 lb.       Depression Screen    02/07/2023   10:35 AM 01/31/2022    2:46 PM 01/25/2021    2:33 PM 12/09/2019   11:56 AM 12/05/2018    1:24 PM 09/20/2017    2:25 PM 10/25/2016   11:21 AM  PHQ 2/9 Scores  PHQ - 2 Score 0 0 0 0 0 0 0  PHQ- 9 Score    0 0      Fall Risk    02/07/2023   10:37 AM 01/31/2022    2:46 PM 01/25/2021    2:33 PM 12/09/2019   11:56 AM 12/05/2018    1:24 PM  Fall Risk   Falls in the past year? 0 0 0 0 0  Number falls in past yr: 0 0 0 0   Injury with Fall? 0 0 0 0   Risk for fall due to : No Fall Risks No Fall Risks  Medication side effect   Follow up Falls prevention discussed;Falls evaluation completed Falls evaluation completed Falls evaluation completed Falls evaluation completed;Falls prevention discussed     FALL RISK PREVENTION PERTAINING TO THE HOME:  Any stairs in or around the home? Yes  If so, are there any without handrails? No  Home free of loose throw rugs in walkways, pet beds, electrical cords, etc? Yes  Adequate lighting in your home to reduce risk of falls? Yes   ASSISTIVE DEVICES UTILIZED TO PREVENT FALLS:  Life alert? No  Use of a cane, walker or w/c? No  Grab bars in the bathroom? Yes  Shower chair or bench in shower? No  Elevated toilet seat or a handicapped toilet? Yes    Cognitive  Function:    12/09/2019   12:00 PM 12/05/2018    1:25 PM 10/25/2016   11:00 AM  MMSE - Mini Mental State Exam  Orientation to time 5 5 5   Orientation to Place 5 5 5   Registration 3 3 3   Attention/ Calculation 5 0 0  Recall 3 3 3   Language- name 2 objects  0 0  Language- repeat 1 1 1   Language- follow 3 step command  3 3  Language- read & follow direction  0 0  Write a sentence  0 0  Copy design  0 0  Total score  20 20        02/07/2023   10:39 AM  6CIT Screen  What Year? 0 points  What month? 0 points  What time? 0 points  Count back from 20 0 points  Months in reverse 0 points  Repeat phrase 2 points  Total Score 2 points    Immunizations Immunization History  Administered Date(s) Administered   COVID-19, mRNA, vaccine(Comirnaty)12 years and older 08/27/2022   Fluad Quad(high Dose 65+) 08/05/2019, 09/02/2022   Influenza Split 08/10/2011, 08/10/2012   Influenza Whole 08/06/2010   Influenza, High Dose Seasonal PF 08/08/2018, 08/06/2021   Influenza,inj,Quad PF,6+ Mos 09/12/2013, 07/30/2014, 08/07/2015, 09/15/2016, 09/08/2017   Influenza-Unspecified 08/05/2019, 08/19/2020   PFIZER Comirnaty(Gray Top)Covid-19 Tri-Sucrose Vaccine 02/26/2021   PFIZER(Purple Top)SARS-COV-2 Vaccination 12/06/2019, 12/27/2019, 09/04/2020   Pfizer Covid-19 Vaccine Bivalent Booster 52yrs & up 09/03/2021   Pneumococcal Conjugate-13 09/18/2014   Pneumococcal Polysaccharide-23 01/25/2011   Td 07/07/2010   Tdap 02/07/2022   Zoster Recombinat (Shingrix) 09/04/2019, 02/20/2020   Zoster, Live 08/09/2010    TDAP status: Up to date  Flu Vaccine status: Up to date  Pneumococcal vaccine status: Up to date  Covid-19 vaccine status: Information provided on how to obtain vaccines.   Qualifies for Shingles Vaccine? Yes   Zostavax completed  unknown   Shingrix Completed?: Yes  Screening Tests Health Maintenance  Topic Date Due   Hepatitis C Screening  Never done   COVID-19 Vaccine (7 -  2023-24 season) 10/22/2022   Medicare Annual Wellness (AWV)  02/07/2024   DTaP/Tdap/Td (3 - Td or Tdap) 02/08/2032   Pneumonia Vaccine 42+ Years old  Completed   INFLUENZA VACCINE  Completed   DEXA SCAN  Completed   Zoster Vaccines- Shingrix  Completed   HPV VACCINES  Aged Out   COLONOSCOPY (Pts 45-53yrs Insurance coverage will need to be confirmed)  Discontinued    Health Maintenance  Health Maintenance Due  Topic Date Due   Hepatitis C Screening  Never done   COVID-19 Vaccine (7 - 2023-24 season) 10/22/2022    Colorectal cancer screening: No longer required.   Mammogram status: Completed 07/14/22. Repeat every year  Bone Density status: Completed 10/04/21. Results reflect: Bone density results: OSTEOPENIA. Repeat every 2 years. Order placed.  Lung Cancer Screening: (Low Dose CT Chest recommended if Age 72-80 years, 30 pack-year currently smoking OR have quit w/in 15years.) does not qualify.   Lung Cancer Screening Referral: no  Additional Screening:  Hepatitis C Screening: does qualify; Completed due  Vision Screening: Recommended annual ophthalmology exams for early detection of glaucoma and other disorders of the eye. Is the patient up to date with their annual eye exam?  Yes  Who is the provider or what is the name of the office in which the patient attends annual eye exams? [place] If pt is not established with a provider, would they like to be referred to a provider to establish care? No .   Dental Screening: Recommended annual dental exams for proper oral hygiene  Community Resource Referral / Chronic Care Management: CRR required this visit?  No   CCM required this visit?  No      Plan:     I have personally reviewed and noted the following in the patient's chart:   Medical and social history Use of alcohol, tobacco or illicit drugs  Current medications and supplements including opioid prescriptions. Patient is currently taking opioid prescriptions.  Information provided to patient regarding non-opioid alternatives. Patient advised to discuss non-opioid treatment plan with their provider. Functional ability and status Nutritional status Physical activity Advanced directives List of other physicians Hospitalizations, surgeries, and ER visits in previous 12 months Vitals Screenings to include cognitive, depression, and falls Referrals and appointments  In  addition, I have reviewed and discussed with patient certain preventive protocols, quality metrics, and best practice recommendations. A written personalized care plan for preventive services as well as general preventive health recommendations were provided to patient.     Lebron Conners, LPN   [id number]   Nurse Notes: Order placed for dexa scan.

## 2023-02-09 ENCOUNTER — Ambulatory Visit (INDEPENDENT_AMBULATORY_CARE_PROVIDER_SITE_OTHER)
Admission: RE | Admit: 2023-02-09 | Discharge: 2023-02-09 | Disposition: A | Discharge status: Home / Self Care | Payer: Medicare Other | Source: Ambulatory Visit | Attending: Family Medicine | Admitting: Family Medicine

## 2023-02-09 ENCOUNTER — Ambulatory Visit (INDEPENDENT_AMBULATORY_CARE_PROVIDER_SITE_OTHER): Payer: Medicare Other | Admitting: Family Medicine

## 2023-02-09 ENCOUNTER — Encounter: Payer: Self-pay | Admitting: Family Medicine

## 2023-02-09 VITALS — BP 118/72 | HR 74 | Temp 97.0°F | Ht 64.0 in | Wt 164.0 lb

## 2023-02-09 DIAGNOSIS — R0602 Shortness of breath: Secondary | ICD-10-CM | POA: Diagnosis not present

## 2023-02-09 DIAGNOSIS — M5136 Other intervertebral disc degeneration, lumbar region: Secondary | ICD-10-CM

## 2023-02-09 DIAGNOSIS — E785 Hyperlipidemia, unspecified: Secondary | ICD-10-CM

## 2023-02-09 DIAGNOSIS — I10 Essential (primary) hypertension: Secondary | ICD-10-CM | POA: Diagnosis not present

## 2023-02-09 DIAGNOSIS — G4700 Insomnia, unspecified: Secondary | ICD-10-CM

## 2023-02-09 DIAGNOSIS — Z0000 Encounter for general adult medical examination without abnormal findings: Secondary | ICD-10-CM

## 2023-02-09 DIAGNOSIS — Z7189 Other specified counseling: Secondary | ICD-10-CM | POA: Diagnosis not present

## 2023-02-09 LAB — BRAIN NATRIURETIC PEPTIDE: Pro B Natriuretic peptide (BNP): 30 pg/mL (ref 0.0–100.0)

## 2023-02-09 NOTE — Assessment & Plan Note (Signed)
Advance directive- daughter designated if patient were incapacitated.  

## 2023-02-09 NOTE — Patient Instructions (Signed)
Go to the lab on the way out.   If you have mychart we'll likely use that to update you.    Take care.  Glad to see you. Skip lipitor for 10 days and see if your cramps are better.  Either way, let me know.   Take care.  Glad to see you.

## 2023-02-09 NOTE — Progress Notes (Signed)
Hypertension:    Using medication without problems or lightheadedness: yes Chest pain with exertion:no Edema:no Short of breath: only with sig exertion, gradually worse.   Elevated Cholesterol: Using medications without problems: yes Muscle aches: not usually but some cramping with overstretching in the AM.  Diet compliance: yes Exercise:d/w pt.    Not anemic, d/w pt.  Labs d/w pt.  Not on iron currently.   Taking soma/ultracet for back pain at baseline.  No ADE on med.  It helped.  Not sedated.  Insomnia improved with trazodone.  Some nights with dec sleep but overall improved.  No ADE on med.  Flu 2023 Shingles previously done PNA previously done Tetanus 2023 COVID-vaccine previously done RSV d/w pt.   Colonoscopy 2023 Breast cancer screening 2023 Bone density test 2022 Advance directive-daughter designated if patient were incapacitated.  Meds, vitals, and allergies reviewed.   PMH and SH reviewed  ROS: Per HPI unless specifically indicated in ROS section   GEN: nad, alert and oriented HEENT: ncat NECK: supple w/o LA CV: rrr. PULM: ctab, no inc wob ABD: soft, +bs EXT: no edema SKIN: no acute rash

## 2023-02-10 ENCOUNTER — Encounter: Payer: Medicare Other | Admitting: Family Medicine

## 2023-02-12 NOTE — Assessment & Plan Note (Addendum)
Continue triamterene hydrochlorothiazide as is.  With some shortness of breath on exertion.  See notes on labs.  It could be related to relative deconditioning.

## 2023-02-12 NOTE — Assessment & Plan Note (Signed)
Insomnia improved with trazodone.  Some nights with dec sleep but overall improved.   Continue as is.

## 2023-02-12 NOTE — Assessment & Plan Note (Signed)
Some muscle aches noted.  Reasonable to skip lipitor for 10 days and see if cramps are better.  Either way, let me know.

## 2023-02-12 NOTE — Assessment & Plan Note (Signed)
  Taking soma/ultracet for back pain at baseline.  No ADE on med.  It helped.   Continue Soma/Ultracet as is.

## 2023-02-12 NOTE — Assessment & Plan Note (Signed)
Flu 2023 Shingles previously done PNA previously done Tetanus 2023 COVID-vaccine previously done RSV d/w pt.   Colonoscopy 2023 Breast cancer screening 2023 Bone density test 2022 Advance directive-daughter designated if patient were incapacitated.

## 2023-02-13 DIAGNOSIS — H60543 Acute eczematoid otitis externa, bilateral: Secondary | ICD-10-CM | POA: Diagnosis not present

## 2023-02-13 DIAGNOSIS — H6123 Impacted cerumen, bilateral: Secondary | ICD-10-CM | POA: Diagnosis not present

## 2023-02-16 ENCOUNTER — Encounter: Payer: Self-pay | Admitting: Family Medicine

## 2023-02-20 ENCOUNTER — Encounter: Payer: Self-pay | Admitting: Family Medicine

## 2023-02-21 ENCOUNTER — Other Ambulatory Visit (HOSPITAL_COMMUNITY): Payer: Self-pay

## 2023-02-21 ENCOUNTER — Telehealth: Payer: Self-pay

## 2023-02-21 ENCOUNTER — Encounter: Payer: Self-pay | Admitting: Family Medicine

## 2023-02-21 NOTE — Telephone Encounter (Signed)
Pharmacy Patient Advocate Encounter   Received notification that prior authorization for Carisoprodol 350MG  tablets is required/requested.  Per Test Claim: PA required   PA submitted on 02/21/23 to (ins) [employer] via [employer] or (Medicaid) confirmation # [id number]  Status is pending

## 2023-02-21 NOTE — Telephone Encounter (Signed)
Patient Advocate Encounter  Prior Authorization for Carisoprodol 350MG  tablets has been approved.    PA#  [id number] Effective dates: 11/23/22 through 05/22/23

## 2023-03-06 NOTE — Telephone Encounter (Signed)
PA approved. Effective dates: 11/23/22 through 05/22/23. Per previous encounter, Patient is aware.

## 2023-03-14 DIAGNOSIS — M1711 Unilateral primary osteoarthritis, right knee: Secondary | ICD-10-CM | POA: Diagnosis not present

## 2023-03-14 DIAGNOSIS — M7051 Other bursitis of knee, right knee: Secondary | ICD-10-CM | POA: Diagnosis not present

## 2023-03-23 DIAGNOSIS — M25561 Pain in right knee: Secondary | ICD-10-CM | POA: Diagnosis not present

## 2023-03-29 DIAGNOSIS — M25561 Pain in right knee: Secondary | ICD-10-CM | POA: Diagnosis not present

## 2023-04-03 DIAGNOSIS — M25561 Pain in right knee: Secondary | ICD-10-CM | POA: Diagnosis not present

## 2023-04-04 ENCOUNTER — Encounter: Payer: Self-pay | Admitting: Family Medicine

## 2023-04-04 ENCOUNTER — Other Ambulatory Visit: Payer: Self-pay | Admitting: Family Medicine

## 2023-04-05 DIAGNOSIS — M25561 Pain in right knee: Secondary | ICD-10-CM | POA: Diagnosis not present

## 2023-04-12 DIAGNOSIS — M25561 Pain in right knee: Secondary | ICD-10-CM | POA: Diagnosis not present

## 2023-04-14 DIAGNOSIS — M25561 Pain in right knee: Secondary | ICD-10-CM | POA: Diagnosis not present

## 2023-04-17 DIAGNOSIS — M25561 Pain in right knee: Secondary | ICD-10-CM | POA: Diagnosis not present

## 2023-04-19 DIAGNOSIS — M25561 Pain in right knee: Secondary | ICD-10-CM | POA: Diagnosis not present

## 2023-04-26 DIAGNOSIS — M25561 Pain in right knee: Secondary | ICD-10-CM | POA: Diagnosis not present

## 2023-04-28 DIAGNOSIS — M25561 Pain in right knee: Secondary | ICD-10-CM | POA: Diagnosis not present

## 2023-04-29 ENCOUNTER — Other Ambulatory Visit: Payer: Self-pay | Admitting: Family Medicine

## 2023-05-03 DIAGNOSIS — M25561 Pain in right knee: Secondary | ICD-10-CM | POA: Diagnosis not present

## 2023-05-04 DIAGNOSIS — M25561 Pain in right knee: Secondary | ICD-10-CM | POA: Diagnosis not present

## 2023-05-09 DIAGNOSIS — M25561 Pain in right knee: Secondary | ICD-10-CM | POA: Diagnosis not present

## 2023-05-12 DIAGNOSIS — M25561 Pain in right knee: Secondary | ICD-10-CM | POA: Diagnosis not present

## 2023-05-17 DIAGNOSIS — M25561 Pain in right knee: Secondary | ICD-10-CM | POA: Diagnosis not present

## 2023-05-19 DIAGNOSIS — M25561 Pain in right knee: Secondary | ICD-10-CM | POA: Diagnosis not present

## 2023-06-08 ENCOUNTER — Ambulatory Visit (INDEPENDENT_AMBULATORY_CARE_PROVIDER_SITE_OTHER): Payer: Medicare Other | Admitting: Dermatology

## 2023-06-08 ENCOUNTER — Ambulatory Visit: Payer: Medicare Other | Admitting: Dermatology

## 2023-06-08 ENCOUNTER — Other Ambulatory Visit: Payer: Self-pay | Admitting: Surgery

## 2023-06-08 ENCOUNTER — Encounter: Payer: Self-pay | Admitting: Dermatology

## 2023-06-08 VITALS — BP 141/74 | HR 72

## 2023-06-08 DIAGNOSIS — B353 Tinea pedis: Secondary | ICD-10-CM

## 2023-06-08 DIAGNOSIS — Z1231 Encounter for screening mammogram for malignant neoplasm of breast: Secondary | ICD-10-CM

## 2023-06-08 NOTE — Patient Instructions (Addendum)
Apply lamisil (terbinafine) cream twice a day to affected area on foot until rash is clear and for one week after.     Due to recent changes in healthcare laws, you may see results of your pathology and/or laboratory studies on MyChart before the doctors have had a chance to review them. We understand that in some cases there may be results that are confusing or concerning to you. Please understand that not all results are received at the same time and often the doctors may need to interpret multiple results in order to provide you with the best plan of care or course of treatment. Therefore, we ask that you please give [place] 2 business days to thoroughly review all your results before contacting the office for clarification. Should we see a critical lab result, you will be contacted sooner.   If You Need Anything After Your Visit  If you have any questions or concerns for your doctor, please call our main line at [phone] and press option 4 to reach your doctor's medical assistant. If no one answers, please leave a voicemail as directed and we will return your call as soon as possible. Messages left after 4 pm will be answered the following business day.   You may also send [place] a message via MyChart. We typically respond to MyChart messages within 1-2 business days.  For prescription refills, please ask your pharmacy to contact our office. Our fax number is [phone].  If you have an urgent issue when the clinic is closed that cannot wait until the next business day, you can page your doctor at the number below.    Please note that while we do our best to be available for urgent issues outside of office hours, we are not available 24/7.   If you have an urgent issue and are unable to reach [place], you may choose to seek medical care at your doctor's office, retail clinic, urgent care center, or emergency room.  If you have a medical emergency, please immediately call 911 or go to the emergency  department.  Pager Numbers  - Dr. Gwen Pounds: [phone]  - Dr. Neale Burly: [phone]  - Dr. Roseanne Reno: [phone]  In the event of inclement weather, please call our main line at [phone] for an update on the status of any delays or closures.  Dermatology Medication Tips: Please keep the boxes that topical medications come in in order to help keep track of the instructions about where and how to use these. Pharmacies typically print the medication instructions only on the boxes and not directly on the medication tubes.   If your medication is too expensive, please contact our office at [phone] option 4 or send [place] a message through MyChart.   We are unable to tell what your co-pay for medications will be in advance as this is different depending on your insurance coverage. However, we may be able to find a substitute medication at lower cost or fill out paperwork to get insurance to cover a needed medication.   If a prior authorization is required to get your medication covered by your insurance company, please allow [place] 1-2 business days to complete this process.  Drug prices often vary depending on where the prescription is filled and some pharmacies may offer cheaper prices.  The website www.goodrx.com contains coupons for medications through different pharmacies. The prices here do not account for what the cost may be with help from insurance (it may be cheaper with your insurance), but the  website can give you the price if you did not use any insurance.  - You can print the associated coupon and take it with your prescription to the pharmacy.  - You may also stop by our office during regular business hours and pick up a GoodRx coupon card.  - If you need your prescription sent electronically to a different pharmacy, notify our office through [facility] or by phone at [phone] option 4.     Si Usted Necesita Algo Despus de Su Visita  Tambin puede enviarnos un  mensaje a travs de Clinical cytogeneticist. Por lo general respondemos a los mensajes de MyChart en el transcurso de 1 a 2 das hbiles.  Para renovar recetas, por favor pida a su farmacia que se ponga en contacto con nuestra oficina. Annie Sable de fax es [place] [phone].  Si tiene un asunto urgente cuando la clnica est cerrada y que no puede esperar hasta el siguiente da hbil, puede llamar/localizar a su doctor(a) al nmero que aparece a continuacin.   Por favor, tenga en cuenta que aunque hacemos todo lo posible para estar disponibles para asuntos urgentes fuera del horario de [place], no estamos disponibles las 24 horas del da, los 7 [address]  [address] de la [place].   Si tiene un problema urgente y no puede comunicarse con nosotros, puede optar por buscar atencin mdica  en el consultorio de su doctor(a), en una clnica privada, en un centro de atencin urgente o en una sala de emergencias.  Si tiene Engineer, drilling, por favor llame inmediatamente al 911 o vaya a la sala de emergencias.  Nmeros de bper  - Dr. Gwen Pounds: [phone]  - Dra. Moye: [phone]  - Dra. Roseanne Reno: [phone]  En caso de inclemencias del [place], por favor llame a Lacy Duverney principal al [phone] para una actualizacin sobre el [place] de cualquier retraso o cierre.  Consejos para la medicacin en dermatologa: Por favor, guarde las cajas en las que vienen los medicamentos de uso tpico para ayudarle a seguir las instrucciones sobre dnde y cmo usarlos. Las farmacias generalmente imprimen las instrucciones del medicamento slo en las cajas y no directamente en los tubos del [place].   Si su medicamento es muy caro, por favor, pngase en contacto con Rolm Gala llamando al [phone] y presione la opcin 4 o envenos un mensaje a travs de Clinical cytogeneticist.   No podemos decirle cul ser su copago por los medicamentos por adelantado ya que esto es diferente dependiendo de la cobertura de su seguro. Sin embargo,  es posible que podamos encontrar un medicamento sustituto a Audiological scientist un formulario para que el seguro cubra el medicamento que se considera necesario.   Si se requiere una autorizacin previa para que su compaa de seguros [place] su medicamento, por favor permtanos de 1 a 2 das hbiles para completar [address]  Los precios de los medicamentos varan con frecuencia dependiendo del Environmental consultant de dnde se surte la receta y alguna farmacias pueden ofrecer precios ms baratos.  El sitio web www.goodrx.com tiene cupones para medicamentos de Health and safety inspector. Los precios aqu no tienen en cuenta lo que podra costar con la ayuda del seguro (puede ser ms barato con su seguro), pero el sitio web puede darle el precio si no utiliz Tourist information centre manager.  - Puede imprimir el cupn correspondiente y llevarlo con su receta a la farmacia.  - Tambin puede pasar por nuestra oficina durante el horario de atencin regular y Education officer, museum una tarjeta de cupones de GoodRx.  -  Si necesita que su receta se enve electrnicamente a Psychiatrist, informe a nuestra oficina a travs de MyChart de [place] o por telfono llamando al [phone] y presione la opcin 4.

## 2023-06-08 NOTE — Progress Notes (Signed)
   Follow-Up Visit   Subjective  Alyssa Nolan is a 82 y.o. female who presents for the following: rash at right foot. Dur: >6 months. Itching around the red areas, spreading. No OTC or Rx treatment.    The following portions of the chart were reviewed this encounter and updated as appropriate: medications, allergies, medical history  Review of Systems:  No other skin or systemic complaints except as noted in HPI or Assessment and Plan.  Objective  Well appearing patient in no apparent distress; mood and affect are within normal limits.  A focused examination was performed of the following areas: Feet  Relevant exam findings are noted in the Assessment and Plan.            Assessment & Plan   TINEA PEDIS Exam: Erythematous annular scaly plaque at right medial and posterior heel.   Treatment Plan: KOH performed; positive for hyphae   Apply lamisil (terbinafine) cream twice a day to affected area on foot until rash is clear and for one week after.  Wear footwear when going to the pool.  If not improved in 1 month consider adding oral Terbinafine.    Return if symptoms worsen or fail to improve.  I, Lawson Radar, CMA, am acting as scribe for Elie Goody, MD.   Documentation: I have reviewed the above documentation for accuracy and completeness, and I agree with the above.  Elie Goody, MD

## 2023-06-09 ENCOUNTER — Ambulatory Visit: Payer: Medicare Other | Admitting: Family Medicine

## 2023-07-03 ENCOUNTER — Ambulatory Visit: Payer: Medicare Other | Admitting: Dermatology

## 2023-07-05 ENCOUNTER — Other Ambulatory Visit: Payer: Self-pay | Admitting: Family Medicine

## 2023-07-06 ENCOUNTER — Ambulatory Visit: Payer: Medicare Other | Admitting: Family Medicine

## 2023-07-06 ENCOUNTER — Encounter: Payer: Self-pay | Admitting: Family Medicine

## 2023-07-06 VITALS — BP 122/66 | HR 89 | Temp 97.9°F | Ht 64.0 in | Wt 160.0 lb

## 2023-07-06 DIAGNOSIS — R5383 Other fatigue: Secondary | ICD-10-CM | POA: Diagnosis not present

## 2023-07-06 DIAGNOSIS — D649 Anemia, unspecified: Secondary | ICD-10-CM

## 2023-07-06 MED ORDER — CARISOPRODOL 350 MG PO TABS: ORAL_TABLET | ORAL | 1 refills | Status: DC

## 2023-07-06 NOTE — Patient Instructions (Signed)
Let me get your labs back and we'll go from there.  Take care.  Glad to see you.

## 2023-07-06 NOTE — Progress Notes (Signed)
Soma refill sent.  No ADE on med.  Still having trouble sleeping occ.  She is still dealing with back pain at baseline.  Has gone to PT in the meantime.    Fatigue.  D/w pt about possible ddx, anemia, depression, etc.  She can't accomplish goals due to lack of energy.  H/o anemia noted.  Still motivated to read.  She can still complete her aerobics class w/o CP.  She doesn't snore hard enough to wake herself up.  No SI/HI.    Inc burping in the last 3 weeks.  No blood in stool.  No abd pain.  Some nausea, can be before or after eating.  Intermittent, not constant. No vomiting. Still on meloxicam.    Meds, vitals, and allergies reviewed.   ROS: Per HPI unless specifically indicated in ROS section   Nad Appears slightly tired.   Ncat Neck supple, no LA Rrr Ctab Abd soft, not ttp.   No BLE edema.  Skin well-perfused.

## 2023-07-07 LAB — COMPREHENSIVE METABOLIC PANEL
ALT: 13 U/L (ref 0–35)
AST: 22 U/L (ref 0–37)
Albumin: 4 g/dL (ref 3.5–5.2)
Alkaline Phosphatase: 83 U/L (ref 39–117)
BUN: 14 mg/dL (ref 6–23)
CO2: 31 meq/L (ref 19–32)
Calcium: 10.5 mg/dL (ref 8.4–10.5)
Chloride: 101 meq/L (ref 96–112)
Creatinine, Ser: 0.82 mg/dL (ref 0.40–1.20)
GFR: 67.76 mL/min (ref >60.00)
Glucose, Bld: 111 mg/dL — ABNORMAL HIGH (ref 70–99)
Potassium: 3.7 meq/L (ref 3.5–5.1)
Sodium: 140 mEq/L (ref 135–145)
Total Bilirubin: 0.5 mg/dL (ref 0.2–1.2)
Total Protein: 6.8 g/dL (ref 6.0–8.3)

## 2023-07-07 LAB — CBC WITH DIFFERENTIAL/PLATELET
Basophils Absolute: 0 10*3/uL (ref 0.0–0.1)
Basophils Relative: 0.3 % (ref 0.0–3.0)
Eosinophils Absolute: 0.2 10*3/uL (ref 0.0–0.7)
Eosinophils Relative: 2.8 % (ref 0.0–5.0)
HCT: 47.9 % — ABNORMAL HIGH (ref 36.0–46.0)
Hemoglobin: 16.1 g/dL — ABNORMAL HIGH (ref 12.0–15.0)
Lymphocytes Relative: 19.6 % (ref 12.0–46.0)
Lymphs Abs: 1.6 10*3/uL (ref 0.7–4.0)
MCHC: 33.6 g/dL (ref 30.0–36.0)
MCV: 91.7 fl (ref 78.0–100.0)
Monocytes Absolute: 0.8 10*3/uL (ref 0.1–1.0)
Monocytes Relative: 10 % (ref 3.0–12.0)
Neutro Abs: 5.6 10*3/uL (ref 1.4–7.7)
Neutrophils Relative %: 67.3 % (ref 43.0–77.0)
Platelets: 172 10*3/uL (ref 150.0–400.0)
RBC: 5.22 Mil/uL — ABNORMAL HIGH (ref 3.87–5.11)
RDW: 12.9 % (ref 11.5–15.5)
WBC: 8.2 10*3/uL (ref 4.0–10.5)

## 2023-07-07 LAB — IRON: Iron: 95 ug/dL (ref 42–145)

## 2023-07-07 LAB — TSH: TSH: 1.69 u[IU]/mL (ref 0.35–5.50)

## 2023-07-07 LAB — FERRITIN: Ferritin: 11.1 ng/mL (ref 10.0–291.0)

## 2023-07-07 LAB — VITAMIN B12: Vitamin B-12: 1226 pg/mL — ABNORMAL HIGH (ref 211–911)

## 2023-07-09 DIAGNOSIS — R5383 Other fatigue: Secondary | ICD-10-CM | POA: Insufficient documentation

## 2023-07-09 NOTE — Assessment & Plan Note (Signed)
Of unclear source.  Broad differential discussed with patient.  See above.  I think it makes sense to check baseline labs and then go from there.  See notes on labs.  She agrees to plan.  Benign abdominal exam.  Okay for outpatient follow-up. 30 minutes were devoted to patient care in this encounter (this includes time spent reviewing the patient's file/history, interviewing and examining the patient, counseling/reviewing plan with patient).

## 2023-07-12 ENCOUNTER — Telehealth: Payer: Self-pay | Admitting: Family Medicine

## 2023-07-12 NOTE — Telephone Encounter (Signed)
Patient returned call regarding labs, requested a call back when possible.

## 2023-07-13 NOTE — Telephone Encounter (Signed)
See result note for further documentation.

## 2023-07-18 ENCOUNTER — Other Ambulatory Visit: Payer: Self-pay | Admitting: Family Medicine

## 2023-07-19 ENCOUNTER — Encounter: Payer: Self-pay | Admitting: Family Medicine

## 2023-07-19 ENCOUNTER — Ambulatory Visit (INDEPENDENT_AMBULATORY_CARE_PROVIDER_SITE_OTHER): Payer: Medicare Other | Admitting: Dermatology

## 2023-07-19 DIAGNOSIS — Z79899 Other long term (current) drug therapy: Secondary | ICD-10-CM | POA: Diagnosis not present

## 2023-07-19 DIAGNOSIS — B353 Tinea pedis: Secondary | ICD-10-CM

## 2023-07-19 DIAGNOSIS — L578 Other skin changes due to chronic exposure to nonionizing radiation: Secondary | ICD-10-CM | POA: Diagnosis not present

## 2023-07-19 DIAGNOSIS — L814 Other melanin hyperpigmentation: Secondary | ICD-10-CM

## 2023-07-19 DIAGNOSIS — Z1283 Encounter for screening for malignant neoplasm of skin: Secondary | ICD-10-CM

## 2023-07-19 DIAGNOSIS — Z7189 Other specified counseling: Secondary | ICD-10-CM

## 2023-07-19 DIAGNOSIS — D229 Melanocytic nevi, unspecified: Secondary | ICD-10-CM

## 2023-07-19 DIAGNOSIS — Z85828 Personal history of other malignant neoplasm of skin: Secondary | ICD-10-CM | POA: Diagnosis not present

## 2023-07-19 DIAGNOSIS — L820 Inflamed seborrheic keratosis: Secondary | ICD-10-CM | POA: Diagnosis not present

## 2023-07-19 DIAGNOSIS — Z8589 Personal history of malignant neoplasm of other organs and systems: Secondary | ICD-10-CM

## 2023-07-19 MED ORDER — KETOCONAZOLE 2 % EX CREA
TOPICAL_CREAM | CUTANEOUS | 11 refills | Status: DC
Start: 2023-07-19 — End: 2023-10-19

## 2023-07-19 NOTE — Patient Instructions (Addendum)
For fungus at right foot Start ketoconazole cream apply to entire foot nightly for 3 months     Seborrheic Keratosis  What causes seborrheic keratoses? Seborrheic keratoses are harmless, common skin growths that first appear during adult life.  As time goes by, more growths appear.  Some people may develop a large number of them.  Seborrheic keratoses appear on both covered and uncovered body parts.  They are not caused by sunlight.  The tendency to develop seborrheic keratoses can be inherited.  They vary in color from skin-colored to gray, brown, or even black.  They can be either smooth or have a rough, warty surface.   Seborrheic keratoses are superficial and look as if they were stuck on the skin.  Under the microscope this type of keratosis looks like layers upon layers of skin.  That is why at times the top layer may seem to fall off, but the rest of the growth remains and re-grows.    Treatment Seborrheic keratoses do not need to be treated, but can easily be removed in the office.  Seborrheic keratoses often cause symptoms when they rub on clothing or jewelry.  Lesions can be in the way of shaving.  If they become inflamed, they can cause itching, soreness, or burning.  Removal of a seborrheic keratosis can be accomplished by freezing, burning, or surgery. If any spot bleeds, scabs, or grows rapidly, please return to have it checked, as these can be an indication of a skin cancer.   Cryotherapy Aftercare  Wash gently with soap and water everyday.   Apply Vaseline and Band-Aid daily until healed.     Melanoma ABCDEs  Melanoma is the most dangerous type of skin cancer, and is the leading cause of death from skin disease.  You are more likely to develop melanoma if you: Have light-colored skin, light-colored eyes, or red or blond hair Spend a lot of time in the sun Tan regularly, either outdoors or in a tanning bed Have had blistering sunburns, especially during childhood Have a  close family member who has had a melanoma Have atypical moles or large birthmarks  Early detection of melanoma is key since treatment is typically straightforward and cure rates are extremely high if we catch it early.   The first sign of melanoma is often a change in a mole or a new dark spot.  The ABCDE system is a way of remembering the signs of melanoma.  A for asymmetry:  The two halves do not match. B for border:  The edges of the growth are irregular. C for color:  A mixture of colors are present instead of an even brown color. D for diameter:  Melanomas are usually (but not always) greater than 6mm - the size of a pencil eraser. E for evolution:  The spot keeps changing in size, shape, and color.  Please check your skin once per month between visits. You can use a small mirror in front and a large mirror behind you to keep an eye on the back side or your body.   If you see any new or changing lesions before your next follow-up, please call to schedule a visit.  Please continue daily skin protection including broad spectrum sunscreen SPF 30+ to sun-exposed areas, reapplying every 2 hours as needed when you're outdoors.   Staying in the shade or wearing long sleeves, sun glasses (UVA+UVB protection) and wide brim hats (4-inch brim around the entire circumference of the hat) are also recommended  for sun protection.    Due to recent changes in healthcare laws, you may see results of your pathology and/or laboratory studies on MyChart before the doctors have had a chance to review them. We understand that in some cases there may be results that are confusing or concerning to you. Please understand that not all results are received at the same time and often the doctors may need to interpret multiple results in order to provide you with the best plan of care or course of treatment. Therefore, we ask that you please give [place] 2 business days to thoroughly review all your results before  contacting the office for clarification. Should we see a critical lab result, you will be contacted sooner.   If You Need Anything After Your Visit  If you have any questions or concerns for your doctor, please call our main line at [phone] and press option 4 to reach your doctor's medical assistant. If no one answers, please leave a voicemail as directed and we will return your call as soon as possible. Messages left after 4 pm will be answered the following business day.   You may also send [place] a message via MyChart. We typically respond to MyChart messages within 1-2 business days.  For prescription refills, please ask your pharmacy to contact our office. Our fax number is [phone].  If you have an urgent issue when the clinic is closed that cannot wait until the next business day, you can page your doctor at the number below.    Please note that while we do our best to be available for urgent issues outside of office hours, we are not available 24/7.   If you have an urgent issue and are unable to reach [place], you may choose to seek medical care at your doctor's office, retail clinic, urgent care center, or emergency room.  If you have a medical emergency, please immediately call 911 or go to the emergency department.  Pager Numbers  - Dr. Gwen Pounds: [phone]  - Dr. Roseanne Reno: [phone]  - Dr. Katrinka Blazing: [phone]   In the event of inclement weather, please call our main line at [phone] for an update on the status of any delays or closures.  Dermatology Medication Tips: Please keep the boxes that topical medications come in in order to help keep track of the instructions about where and how to use these. Pharmacies typically print the medication instructions only on the boxes and not directly on the medication tubes.   If your medication is too expensive, please contact our office at [phone] option 4 or send [place] a message through MyChart.   We are unable to tell  what your co-pay for medications will be in advance as this is different depending on your insurance coverage. However, we may be able to find a substitute medication at lower cost or fill out paperwork to get insurance to cover a needed medication.   If a prior authorization is required to get your medication covered by your insurance company, please allow [place] 1-2 business days to complete this process.  Drug prices often vary depending on where the prescription is filled and some pharmacies may offer cheaper prices.  The website www.goodrx.com contains coupons for medications through different pharmacies. The prices here do not account for what the cost may be with help from insurance (it may be cheaper with your insurance), but the website can give you the price if you did not use any insurance.  - You can  print the associated coupon and take it with your prescription to the pharmacy.  - You may also stop by our office during regular business hours and pick up a GoodRx coupon card.  - If you need your prescription sent electronically to a different pharmacy, notify our office through [facility] or by phone at [phone] option 4.     Si Usted Necesita Algo Despus de Su Visita  Tambin puede enviarnos un mensaje a travs de Clinical cytogeneticist. Por lo general respondemos a los mensajes de MyChart en el transcurso de 1 a 2 das hbiles.  Para renovar recetas, por favor pida a su farmacia que se ponga en contacto con nuestra oficina. Annie Sable de fax es [place] [phone].  Si tiene un asunto urgente cuando la clnica est cerrada y que no puede esperar hasta el siguiente da hbil, puede llamar/localizar a su doctor(a) al nmero que aparece a continuacin.   Por favor, tenga en cuenta que aunque hacemos todo lo posible para estar disponibles para asuntos urgentes fuera del horario de [place], no estamos disponibles las 24 horas del da, los 7 [address]  [address] de la [place].   Si tiene un problema  urgente y no puede comunicarse con nosotros, puede optar por buscar atencin mdica  en el consultorio de su doctor(a), en una clnica privada, en un centro de atencin urgente o en una sala de emergencias.  Si tiene Engineer, drilling, por favor llame inmediatamente al 911 o vaya a la sala de emergencias.  Nmeros de bper  - Dr. Gwen Pounds: [phone]  - Dra. Roseanne Reno: [phone]  - Dr. Katrinka Blazing: [phone]   En caso de inclemencias del tiempo, por favor llame a Lacy Duverney principal al [phone] para una actualizacin sobre el [place] de cualquier retraso o cierre.  Consejos para la medicacin en dermatologa: Por favor, guarde las cajas en las que vienen los medicamentos de uso tpico para ayudarle a seguir las instrucciones sobre dnde y cmo usarlos. Las farmacias generalmente imprimen las instrucciones del medicamento slo en las cajas y no directamente en los tubos del [place].   Si su medicamento es muy caro, por favor, pngase en contacto con Rolm Gala llamando al [phone] y presione la opcin 4 o envenos un mensaje a travs de Clinical cytogeneticist.   No podemos decirle cul ser su copago por los medicamentos por adelantado ya que esto es diferente dependiendo de la cobertura de su seguro. Sin embargo, es posible que podamos encontrar un medicamento sustituto a Audiological scientist un formulario para que el seguro cubra el medicamento que se considera necesario.   Si se requiere una autorizacin previa para que su compaa de seguros [place] su medicamento, por favor permtanos de 1 a 2 das hbiles para completar [address]  Los precios de los medicamentos varan con frecuencia dependiendo del Environmental consultant de dnde se surte la receta y alguna farmacias pueden ofrecer precios ms baratos.  El sitio web www.goodrx.com tiene cupones para medicamentos de Health and safety inspector. Los precios aqu no tienen en cuenta lo que podra costar con la ayuda del seguro (puede ser ms barato  con su seguro), pero el sitio web puede darle el precio si no utiliz Tourist information centre manager.  - Puede imprimir el cupn correspondiente y llevarlo con su receta a la farmacia.  - Tambin puede pasar por nuestra oficina durante el horario de atencin regular y Education officer, museum una tarjeta de cupones de GoodRx.  - Si necesita que su receta se enve electrnicamente a una farmacia diferente, informe a  nuestra oficina a travs de MyChart de [place] o por telfono llamando al [phone] y presione la opcin 4.

## 2023-07-19 NOTE — Telephone Encounter (Signed)
Refill request for  traMADol-acetaminophen (ULTRACET) 37.5-325 MG tablet   LOV - 07/06/23  NOV - not scheduled  RF - 01/18/23 #180/1

## 2023-07-19 NOTE — Progress Notes (Unsigned)
Follow-Up Visit   Subjective  Alyssa Nolan is a 82 y.o. female who presents for the following: Skin Cancer Screening and Full Body Skin Exam Hx of ak, hx of isks  2 spots at chest, left forehead, left axilla, left shoulder  The patient presents for Total-Body Skin Exam (TBSE) for skin cancer screening and mole check. The patient has spots, moles and lesions to be evaluated, some may be new or changing and the patient may have concern these could be cancer.   The following portions of the chart were reviewed this encounter and updated as appropriate: medications, allergies, medical history  Review of Systems:  No other skin or systemic complaints except as noted in HPI or Assessment and Plan.  Objective  Well appearing patient in no apparent distress; mood and affect are within normal limits.  A full examination was performed including scalp, head, eyes, ears, nose, lips, neck, chest, axillae, abdomen, back, buttocks, bilateral upper extremities, bilateral lower extremities, hands, feet, fingers, toes, fingernails, and toenails. All findings within normal limits unless otherwise noted below.   Relevant physical exam findings are noted in the Assessment and Plan.  left forehead x 3, left chest and left shoulder x 4 (7) Erythematous stuck-on, waxy papule or plaque    Assessment & Plan    Varicose Veins/Spider Veins - Dilated blue, purple or red veins at the lower extremities - Reassured - Smaller vessels can be treated by sclerotherapy (a procedure to inject a medicine into the veins to make them disappear) if desired, but the treatment is not covered by insurance. Larger vessels may be covered if symptomatic and we would refer to vascular surgeon if treatment desired.  SKIN CANCER SCREENING PERFORMED TODAY.  ACTINIC DAMAGE - Chronic condition, secondary to cumulative UV/sun exposure - diffuse scaly erythematous macules with underlying dyspigmentation - Recommend daily  broad spectrum sunscreen SPF 30+ to sun-exposed areas, reapply every 2 hours as needed.  - Staying in the shade or wearing long sleeves, sun glasses (UVA+UVB protection) and wide brim hats (4-inch brim around the entire circumference of the hat) are also recommended for sun protection.  - Call for new or changing lesions.  LENTIGINES, SEBORRHEIC KERATOSES, HEMANGIOMAS - Benign normal skin lesions - Benign-appearing - Call for any changes  MELANOCYTIC NEVI - Tan-brown and/or pink-flesh-colored symmetric macules and papules - Benign appearing on exam today - Observation - Call clinic for new or changing moles - Recommend daily use of broad spectrum spf 30+ sunscreen to sun-exposed areas.   HISTORY OF BASAL CELL CARCINOMA OF THE SKIN Multiple locations see history - No evidence of recurrence today - Recommend regular full body skin exams - Recommend daily broad spectrum sunscreen SPF 30+ to sun-exposed areas, reapply every 2 hours as needed.  - Call if any new or changing lesions are noted between office visits  HISTORY OF SQUAMOUS CELL CARCINOMA OF THE SKIN Multiple locations see history  - No evidence of recurrence today - No lymphadenopathy - Recommend regular full body skin exams - Recommend daily broad spectrum sunscreen SPF 30+ to sun-exposed areas, reapply every 2 hours as needed.  - Call if any new or changing lesions are noted between office visits  Callus at right foot Exam: callus at right foot Treatment Plan: Benign Observe.   TINEA PEDIS Exam: Scaling and maceration web spaces and over distal and lateral soles at right foot Chronic and persistent condition with duration or expected duration over one year. Condition is symptomatic/ bothersome to  patient. Not currently at goal. Treatment Plan: Start ketoconazole 2% cream - apply topically to entire foot nightly for 3 months  Tinea pedis of right foot  Related Medications ketoconazole (NIZORAL) 2 % cream Apply  topically to entire foot nightly for tinea pedis  Inflamed seborrheic keratosis (7) left forehead x 3, left chest and left shoulder x 4  Symptomatic, irritating, patient would like treated.  Destruction of lesion - left forehead x 3, left chest and left shoulder x 4 (7) Complexity: simple   Destruction method: cryotherapy   Informed consent: discussed and consent obtained   Timeout:  patient name, date of birth, surgical site, and procedure verified Lesion destroyed using liquid nitrogen: Yes   Region frozen until ice ball extended beyond lesion: Yes   Outcome: patient tolerated procedure well with no complications   Post-procedure details: wound care instructions given     Return in about 1 year (around 07/18/2024) for TBSE.  IAsher Muir, CMA, am acting as scribe for Armida Sans, MD.   Documentation: I have reviewed the above documentation for accuracy and completeness, and I agree with the above.  Armida Sans, MD

## 2023-07-20 ENCOUNTER — Encounter: Payer: Self-pay | Admitting: Dermatology

## 2023-07-20 ENCOUNTER — Ambulatory Visit
Admission: RE | Admit: 2023-07-20 | Discharge: 2023-07-20 | Disposition: A | Discharge status: Home / Self Care | Payer: Medicare Other | Source: Ambulatory Visit | Attending: Surgery | Admitting: Surgery

## 2023-07-20 DIAGNOSIS — Z1231 Encounter for screening mammogram for malignant neoplasm of breast: Secondary | ICD-10-CM | POA: Diagnosis not present

## 2023-07-31 DIAGNOSIS — Z853 Personal history of malignant neoplasm of breast: Secondary | ICD-10-CM | POA: Diagnosis not present

## 2023-08-08 ENCOUNTER — Encounter: Payer: Self-pay | Admitting: Family Medicine

## 2023-08-14 ENCOUNTER — Encounter: Payer: Self-pay | Admitting: Family Medicine

## 2023-08-14 DIAGNOSIS — Z23 Encounter for immunization: Secondary | ICD-10-CM | POA: Diagnosis not present

## 2023-10-09 ENCOUNTER — Ambulatory Visit
Admission: RE | Admit: 2023-10-09 | Discharge: 2023-10-09 | Disposition: A | Discharge status: Home / Self Care | Payer: Medicare Other | Source: Ambulatory Visit | Attending: Family Medicine | Admitting: Family Medicine

## 2023-10-09 DIAGNOSIS — M85832 Other specified disorders of bone density and structure, left forearm: Secondary | ICD-10-CM | POA: Diagnosis not present

## 2023-10-09 DIAGNOSIS — Z780 Asymptomatic menopausal state: Secondary | ICD-10-CM | POA: Diagnosis not present

## 2023-10-10 ENCOUNTER — Telehealth: Payer: Self-pay

## 2023-10-10 ENCOUNTER — Other Ambulatory Visit (HOSPITAL_COMMUNITY): Payer: Self-pay

## 2023-10-10 NOTE — Telephone Encounter (Signed)
*  Primary  Pharmacy Patient Advocate Encounter  Received notification from [facility] that Prior Authorization for Carisoprodol 350MG  tablets  has been APPROVED from 10/10/2023 to 01/08/2024. Ran test claim, Copay is $0.57. This test claim was processed through [facility]- copay amounts may vary at other pharmacies due to pharmacy/plan contracts, or as the patient moves through the different stages of their insurance plan.   PA #/Case ID/Reference #: [id number]

## 2023-10-19 ENCOUNTER — Ambulatory Visit (INDEPENDENT_AMBULATORY_CARE_PROVIDER_SITE_OTHER): Payer: Medicare Other | Admitting: Family Medicine

## 2023-10-19 ENCOUNTER — Encounter: Payer: Self-pay | Admitting: Family Medicine

## 2023-10-19 VITALS — BP 128/78 | HR 68 | Temp 98.0°F | Ht 64.0 in | Wt 167.2 lb

## 2023-10-19 DIAGNOSIS — R0683 Snoring: Secondary | ICD-10-CM

## 2023-10-19 DIAGNOSIS — M810 Age-related osteoporosis without current pathological fracture: Secondary | ICD-10-CM

## 2023-10-19 DIAGNOSIS — M545 Low back pain, unspecified: Secondary | ICD-10-CM | POA: Diagnosis not present

## 2023-10-19 NOTE — Patient Instructions (Signed)
Let me check with GI about fosamax use.  We'll be in touch.  Keep taking calcium and vitamin D.  Take care.  Glad to see you.

## 2023-10-19 NOTE — Progress Notes (Signed)
The BMD measured at Forearm Radius 33% is 0.618 g/cm2 with a T-score of -3.0.Results discussed with patient. She is taking calcium and vitamin D.   Still on omeprazole and still with noted burping but not acidic reflux.  H/o Barrett's, had seen Dr. Rhea Belton with GI.  No prior bisphosphonate use.    D/w pt about DXA results and osteoporosis path/phys in general, including vit D and calcium.  Reasonable to consider treatment with bisphosphonate, ie fosamax, assuming she could tolerate that.  History of Barrett's noted. D/w pt about risk benefit, especially GI sx, jaw and long bone pathology.    Fatigue.  Family noted sig snoring over the thanksgiving holiday.  D/w pt about OSA eval.  OSA pathophysiology discussed patient.  She got a note from [facility] re: approval of soma through 12/2023.  D/w pt.  No ADE on med.  It helped.  She had used this medication for years without adverse effect.  Discussed that we can reapply for coverage later on if needed.  Meds, vitals, and allergies reviewed.   ROS: Per HPI unless specifically indicated in ROS section   Nad Ncat Neck supple, no LA Rrr Ctab Abd soft, not ttp Skin well-perfused.  35 minutes were devoted to patient care in this encounter (this includes time spent reviewing the patient's file/history, interviewing and examining the patient, counseling/reviewing plan with patient).

## 2023-10-22 ENCOUNTER — Telehealth: Payer: Self-pay | Admitting: Family Medicine

## 2023-10-22 DIAGNOSIS — R0683 Snoring: Secondary | ICD-10-CM | POA: Insufficient documentation

## 2023-10-22 NOTE — Assessment & Plan Note (Signed)
Refer for OSA eval.

## 2023-10-22 NOTE — Assessment & Plan Note (Signed)
Continue exercise as tolerated, calcium and vitamin D.  I am going to check with the GI clinic to make sure they are okay with her trying a bisphosphonate given her history of Barrett's.  See following phone note.

## 2023-10-22 NOTE — Assessment & Plan Note (Signed)
  She got a note from [facility] re: approval of soma through 12/2023.  D/w pt.  No ADE on med.  It helped.  She had used this medication for years without adverse effect.  Discussed that we can reapply for coverage later on if needed.

## 2023-10-22 NOTE — Telephone Encounter (Signed)
Dr. Rhea Belton-   This patient has a history of Barrett's.  Her reflux is controlled with omeprazole.  She has osteoporosis.  Are you okay with her attempting oral Fosamax, assuming she does not have an increase in reflux symptoms or overt dysphagia?  Please let me know and thank you for your help.

## 2023-10-24 NOTE — Telephone Encounter (Signed)
Please update patient.  Dr. Rhea Belton was okay with trial of alendronate but it is important for her drink a full glass of water (after the pill) and remaining upright for an hour after taking the pill.  Let me know if she wants me to send the rx.  Would stop med if worsening reflux with use.  Thanks.

## 2023-10-24 NOTE — Telephone Encounter (Signed)
Spoke with patient who is aware of the instructions upon starting Fosamax.. [facility] [place].Marland Kitchen

## 2023-10-25 MED ORDER — ALENDRONATE SODIUM 70 MG PO TABS: 70.0000 mg | ORAL_TABLET | ORAL | 3 refills | Status: DC

## 2023-10-25 NOTE — Telephone Encounter (Signed)
Sent. Thanks.   

## 2023-10-28 ENCOUNTER — Encounter: Payer: Self-pay | Admitting: Family Medicine

## 2023-11-02 ENCOUNTER — Telehealth: Payer: Self-pay | Admitting: Gastroenterology

## 2023-11-02 ENCOUNTER — Other Ambulatory Visit: Payer: Self-pay | Admitting: Family Medicine

## 2023-11-02 ENCOUNTER — Other Ambulatory Visit: Payer: Self-pay | Admitting: Gastroenterology

## 2023-11-02 MED ORDER — OMEPRAZOLE 20 MG PO CPDR: DELAYED_RELEASE_CAPSULE | ORAL | 3 refills | Status: DC

## 2023-11-02 NOTE — Telephone Encounter (Signed)
PT is calling to have a refill sent for omeprazole to [facility] [place]. Please advise.

## 2023-11-02 NOTE — Telephone Encounter (Signed)
Refill sent to pharmacy.   

## 2023-11-16 ENCOUNTER — Telehealth: Payer: Self-pay | Admitting: Internal Medicine

## 2023-11-16 NOTE — Telephone Encounter (Signed)
 Inbound call from patient stating she is unable to get Prilosec from the pharmacy. Patient is wondering if she needs a prior authorization. Please advise.

## 2023-11-17 ENCOUNTER — Other Ambulatory Visit (HOSPITAL_COMMUNITY): Payer: Self-pay

## 2023-11-17 NOTE — Telephone Encounter (Signed)
 Refill is too soon: NEXT AVAILABLE FILL DATE [id number] LAST FILL DT [id number] FILLED AT [facility] #[phone]

## 2023-11-27 ENCOUNTER — Ambulatory Visit (INDEPENDENT_AMBULATORY_CARE_PROVIDER_SITE_OTHER): Payer: Medicare Other | Admitting: Family Medicine

## 2023-11-27 ENCOUNTER — Encounter: Payer: Self-pay | Admitting: Family Medicine

## 2023-11-27 VITALS — BP 122/70 | HR 81 | Temp 98.8°F | Ht 64.0 in | Wt 165.0 lb

## 2023-11-27 DIAGNOSIS — R059 Cough, unspecified: Secondary | ICD-10-CM

## 2023-11-27 LAB — POC COVID19 BINAXNOW: SARS Coronavirus 2 Ag: NEGATIVE

## 2023-11-27 LAB — POCT INFLUENZA A/B
Influenza A, POC: NEGATIVE
Influenza B, POC: NEGATIVE

## 2023-11-27 MED ORDER — ALBUTEROL SULFATE HFA 108 (90 BASE) MCG/ACT IN AERS: 1.0000 | INHALATION_SPRAY | Freq: Four times a day (QID) | RESPIRATORY_TRACT | 0 refills | Status: DC | PRN

## 2023-11-27 MED ORDER — BENZONATATE 200 MG PO CAPS: 200.0000 mg | ORAL_CAPSULE | Freq: Three times a day (TID) | ORAL | 1 refills | Status: DC | PRN

## 2023-11-27 MED ORDER — DOXYCYCLINE HYCLATE 100 MG PO TABS: 100.0000 mg | ORAL_TABLET | Freq: Two times a day (BID) | ORAL | 0 refills | Status: DC

## 2023-11-27 NOTE — Progress Notes (Signed)
~  5 days of sx.  Cough.  No fevers.  No sputum.  Post nasal gtt prev, some better in the meantime.  Rhinorrhea after coughing.  No vomiting.  No diarrhea.  No facial pain.  Ribs are sore from cough.  Diffuse HA potentially attributed coughing.    Meds, vitals, and allergies reviewed.   ROS: Per HPI unless specifically indicated in ROS section   GEN: nad, alert and oriented HEENT: mucous membranes moist, uvula irritation noted.  tm w/o erythema, nasal exam w/o erythema, clear discharge noted NECK: supple w/o LA CV: rrr.   PULM: no focal dec in BS but B rhonchi noted.  No inc WOB EXT: no edema SKIN: well perfused.   Flu and covid neg.

## 2023-11-27 NOTE — Assessment & Plan Note (Signed)
 Presumed bronchitis.  Start doxycycline, use tessalon and albuterol.  Rest and fluids and update me as needed.  She agrees. Okay for outpatient f/u.

## 2023-11-27 NOTE — Patient Instructions (Addendum)
 [facility]    Start doxycycline, use tessalon and albuterol.  Rest and fluids and update me as needed.  Take care.  Glad to see you.

## 2023-12-04 ENCOUNTER — Encounter: Payer: Self-pay | Admitting: Family Medicine

## 2023-12-04 NOTE — Telephone Encounter (Signed)
Please see if patient can come in tomorrow. Thanks.

## 2023-12-04 NOTE — Telephone Encounter (Signed)
Scheduled patient 2:00 tomorrow.

## 2023-12-05 ENCOUNTER — Encounter: Payer: Self-pay | Admitting: Family Medicine

## 2023-12-05 ENCOUNTER — Ambulatory Visit (INDEPENDENT_AMBULATORY_CARE_PROVIDER_SITE_OTHER)
Admission: RE | Admit: 2023-12-05 | Discharge: 2023-12-05 | Disposition: A | Discharge status: Home / Self Care | Payer: Medicare Other | Source: Ambulatory Visit | Attending: Family Medicine | Admitting: Family Medicine

## 2023-12-05 ENCOUNTER — Ambulatory Visit (INDEPENDENT_AMBULATORY_CARE_PROVIDER_SITE_OTHER): Payer: Medicare Other | Admitting: Family Medicine

## 2023-12-05 VITALS — BP 130/76 | HR 80 | Ht 64.0 in | Wt 161.4 lb

## 2023-12-05 DIAGNOSIS — R059 Cough, unspecified: Secondary | ICD-10-CM | POA: Diagnosis not present

## 2023-12-05 MED ORDER — PREDNISONE 20 MG PO TABS: ORAL_TABLET | ORAL | 0 refills | Status: DC

## 2023-12-05 NOTE — Patient Instructions (Addendum)
Take care.  Glad to see you. Start prednisone with food.  Try taking 1 ultracet in the AM and 1 in the PM, instead of 2 at night.  See if that helps the cough.   Update [place] as needed.  Rest and fluids.

## 2023-12-05 NOTE — Progress Notes (Unsigned)
Cough.  Worse at night.  Done with doxy.  Prev rx for doxy tessalon and SABA.  Tessalon didn't help much.  SABA didn't help much either.  No sputum.  No fevers.  Not SOB at rest.  She doesn't feel well enough to be active at home.  Some occ wheeze.    Clear rhinorrhea after coughing.  Taste is altered. Rib soreness resolved.  HA is gone.    Meds, vitals, and allergies reviewed.   ROS: Per HPI unless specifically indicated in ROS section   Nad Ncat Neck supple, no LA rrr no focal dec in BS but B rhonchi noted, inc from prior OV. No inc WOB  Skin well perfused.   Xray d/w pt at OV. Images reviewed.

## 2023-12-06 NOTE — Assessment & Plan Note (Signed)
Xray d/w pt. Likely bronchitis.  Start prednisone with food.  Try taking 1 ultracet in the AM and 1 in the PM, instead of 2 at night.  She can see if that helps the cough.   Update [place] as needed.  Rest and fluids.  She agrees with plan.

## 2023-12-07 ENCOUNTER — Encounter: Payer: Self-pay | Admitting: Family Medicine

## 2023-12-18 ENCOUNTER — Encounter: Payer: Self-pay | Admitting: Internal Medicine

## 2023-12-18 ENCOUNTER — Ambulatory Visit (INDEPENDENT_AMBULATORY_CARE_PROVIDER_SITE_OTHER): Payer: Medicare Other | Admitting: Internal Medicine

## 2023-12-18 VITALS — BP 138/68 | HR 96 | Ht 64.0 in | Wt 163.0 lb

## 2023-12-18 DIAGNOSIS — G4710 Hypersomnia, unspecified: Secondary | ICD-10-CM | POA: Diagnosis not present

## 2023-12-18 DIAGNOSIS — G4733 Obstructive sleep apnea (adult) (pediatric): Secondary | ICD-10-CM

## 2023-12-18 NOTE — Progress Notes (Signed)
Name: Alyssa Nolan MRN: [id number] DOB: 14-Sep-1941    CHIEF COMPLAINT:  EXCESSIVE DAYTIME SLEEPINESS ASSESSMENT OF OSA   HISTORY OF PRESENT ILLNESS: Patient is seen today for problems and issues with sleep related to excessive daytime sleepiness Patient  has been having sleep problems for many years Patient has been having excessive daytime sleepiness for a long time Patient has been having extreme fatigue and tiredness, lack of energy +  very Loud snoring every night + Nonrefreshing sleep  Discussed sleep data and reviewed with patient.  Encouraged proper weight management.  Discussed driving precautions and its relationship with hypersomnolence.  Discussed operating dangerous equipment and its relationship with hypersomnolence.  Discussed sleep hygiene, and benefits of a fixed sleep waked time.  The importance of getting eight or more hours of sleep discussed with patient.  Discussed limiting the use of the computer and television before bedtime.  Decrease naps during the day, so night time sleep will become enhanced.  Limit caffeine, and sleep deprivation.  HTN, stroke, and heart failure are potential risk factors.   Family members witnessed snoring Patient goes to bed around 10:11 PM Usually gets up at 7 AM No weight change in the last several years Has never had a prior sleep study   EPWORTH SLEEP SCORE 4  Patient with acute viral bronchitis over the last 3 weeks Patient was given antibiotics and prednisone therapy Patient seems to have resolved from her sickness  No exacerbation at this time No evidence of heart failure at this time No evidence or signs of infection at this time No respiratory distress No fevers, chills, nausea, vomiting, diarrhea No evidence of lower extremity edema No evidence hemoptysis   PAST MEDICAL HISTORY :   has a past medical history of Arthritis (2011), Basal cell carcinoma (05/04/2010), Basal cell carcinoma (01/22/2014),  Basal cell carcinoma (10/28/2019), Breast cancer (HCC) (04/28/2016), GERD (gastroesophageal reflux disease), History of chickenpox, Hyperlipidemia, Hypertension, Lichen planus, Osteopenia, Personal history of radiation therapy (2017), Skin cancer, Squamous cell carcinoma of skin (03/29/2010), and Squamous cell carcinoma of skin (12/30/2013).  has a past surgical history that includes MRI, neck (06/23/1998  10/31/2003); Cardiovascular stress test (02/07/2002); MRI, hip (01/12/2004); Cesarean section (1972 & 1975); Endometrial ablation and right ovary removal (2002); Right hip injection (03/30/2009); Removal of excess eyelids and shortening of eyelid tendons (01/2010); Removal of squamous and basal cell carcinoma from nose (04/2010); Touch Up of excess eyelids (04/20/2010); Oophorectomy (Bilateral, 15+ YRS AGO); Colonoscopy (04-22-02); Cholecystectomy (2005); Colonoscopy (09-02-12); Excision basal cell carcinoma (01-22-14); Breast surgery (1968); Breast cyst aspiration (Bilateral); Breast lumpectomy with sentinel lymph node bx (Right, 06/10/2016); Breast lumpectomy with needle localization (Right, 06/10/2016); Skin surgery (10/28/2019); Breast excisional biopsy (Right, 1968); Breast biopsy (Right, 2013); Breast biopsy (Right, 04-28-16); and Breast lumpectomy (Right, 2017). Prior to Admission medications   Medication Sig Start Date End Date Taking? Authorizing Provider  albuterol (VENTOLIN HFA) 108 (90 Base) MCG/ACT inhaler Inhale 1-2 puffs into the lungs every 6 (six) hours as needed for wheezing or shortness of breath. And cough 11/27/23  Yes Joaquim Nam, MD  alendronate (FOSAMAX) 70 MG tablet Take 1 tablet (70 mg total) by mouth every 7 (seven) days. Take with a full glass of water on an empty stomach. 10/25/23  Yes Joaquim Nam, MD  atorvastatin (LIPITOR) 20 MG tablet TAKE 1 TABLET BY MOUTH EVERY DAY 11/02/23  Yes Joaquim Nam, MD  Calcium Carbonate-Vitamin D 600-400 MG-UNIT tablet Take 1 tablet by mouth 2  (two)  times daily.   Yes [provider]  carisoprodol (SOMA) 350 MG tablet TAKE 1 TABLET BY MOUTH EVERYDAY AT BEDTIME 07/06/23  Yes Joaquim Nam, MD  clobetasol ointment (TEMOVATE) 0.05 % Apply 1 application. topically every other day. 01/24/22  Yes Joaquim Nam, MD  diclofenac Sodium (VOLTAREN) 1 % GEL APPLY 2 GRAMS TOPICALLY 4 (FOUR) TIMES DAILY AS NEEDED. 04/07/20  Yes Joaquim Nam, MD  loratadine (CLARITIN) 10 MG tablet Take 10 mg by mouth daily.   Yes [provider]  meloxicam (MOBIC) 7.5 MG tablet Take 7.5 mg by mouth 2 (two) times daily.   Yes [provider]  mometasone (ELOCON) 0.1 % lotion APPLY TO ITCHY EARS TWICE DAILY FOR 12 DAYS. MAY REPEAT AS NEEDED FOR ITCHY EARS   Yes [provider]  omeprazole (PRILOSEC) 20 MG capsule TAKE 1 CAPSULE BY MOUTH EVERY DAY 11/02/23  Yes Zehr, Princella Pellegrini, PA-C  OVER THE COUNTER MEDICATION Essential oil blend   Yes [provider]  polyethylene glycol powder (GLYCOLAX/MIRALAX) 17 GM/SCOOP powder Take 1 Container by mouth once.   Yes [provider]  Propylene Glycol (SYSTANE BALANCE OP) Place 1 drop into both eyes daily.   Yes [provider]  traMADol-acetaminophen (ULTRACET) 37.5-325 MG tablet TAKE 2 TABLETS BY MOUTH AT BEDTIME 07/19/23  Yes Joaquim Nam, MD  traZODone (DESYREL) 100 MG tablet TAKE 1 & 1/2 TABLET BY MOUTH AT NIGHT FOR SLEEP 04/05/23  Yes Joaquim Nam, MD  triamterene-hydrochlorothiazide (DYAZIDE) 37.5-25 MG capsule TAKE 1 CAPSULE BY MOUTH EVERY DAY IN THE MORNING 11/02/23  Yes Joaquim Nam, MD   Allergies  Allergen Reactions   Codeine     REACTION: Felt like she was going to "pass out"    FAMILY HISTORY:  family history includes COPD in her mother; Colon cancer in an other family member; Heart disease in her father; Stroke (age of onset: 65) in her maternal grandfather. SOCIAL HISTORY:  reports that she quit smoking about 54 years ago. Her smoking  use included cigarettes. She has never used smokeless tobacco. She reports that she does not currently use alcohol. She reports that she does not use drugs.   Review of Systems:  Gen:  Denies  fever, sweats, chills weight loss  HEENT: Denies blurred vision, double vision, ear pain, eye pain, hearing loss, nose bleeds, sore throat Cardiac:  No dizziness, chest pain or heaviness, chest tightness,edema, No JVD Resp:   No cough, -sputum production, -shortness of breath,-wheezing, -hemoptysis,  Gi: Denies swallowing difficulty, stomach pain, nausea or vomiting, diarrhea, constipation, bowel incontinence Gu:  Denies bladder incontinence, burning urine Ext:   Denies Joint pain, stiffness or swelling Skin: Denies  skin rash, easy bruising or bleeding or hives Endoc:  Denies polyuria, polydipsia , polyphagia or weight change Psych:   Denies depression, insomnia or hallucinations  Other:  All other systems negative   ALL OTHER ROS ARE NEGATIVE   BP 138/68   Pulse 96   Ht 5\' 4"  (1.626 m)   Wt 163 lb (73.9 kg)   SpO2 95%   BMI 27.98 kg/m     Physical Examination:   General Appearance: No distress  EYES PERRLA, EOM intact.   NECK Supple, No JVD ORAL CAVITY MALLAMPATI 4 Pulmonary: normal breath sounds, No wheezing.  CardiovascularNormal S1,S2.  No m/r/g.   Abdomen: Benign, Soft, non-tender. Skin:   warm, no rashes, no ecchymosis  Extremities: normal, no cyanosis, clubbing. Neuro:without focal findings,  speech  normal  PSYCHIATRIC: Mood, affect within normal limits.   ALL OTHER ROS ARE NEGATIVE    ASSESSMENT AND PLAN SYNOPSIS  83 year old pleasant white female patient with signs and symptoms of excessive daytime sleepiness with probable underlying diagnosis of obstructive sleep apnea in the setting  deconditioned state   Recommend Sleep Study for definitve diagnosis Recommend home sleep study Be aware of reduced alertness and do not drive or operate heavy machinery if  experiencing this or drowsiness.  Exercise encouraged, as tolerated. Encouraged proper weight management.  Important to get eight or more hours of sleep  Limiting the use of the computer and television before bedtime.  Decrease naps during the day, so night time sleep will become enhanced.  Limit caffeine, and sleep deprivation.   Deconditioned state -Recommend increased daily activity and exercise  Acute viral bronchitis Resolving No indication for antibiotics or prednisone at this time Increased exercise tolerance   MEDICATION ADJUSTMENTS/LABS AND TESTS ORDERED: Recommend home sleep study to assess for sleep apnea  Avoid Allergens and Irritants Avoid secondhand smoke Avoid SICK contacts Recommend  Masking  when appropriate Recommend Keep up-to-date with vaccinations    CURRENT MEDICATIONS REVIEWED AT LENGTH WITH PATIENT TODAY   Patient  satisfied with Plan of action and management. All questions answered  Follow up  3 months   I spent a total of  62 minutes reviewing chart data, face-to-face evaluation with the patient, counseling and coordination of care as detailed above.    Lucie Leather, M.D.  [facility]  Medical Director [facility] Medical Director [facility] Cardio-Pulmonary Department

## 2023-12-18 NOTE — Patient Instructions (Signed)
Recommend home sleep study to assess for sleep apnea  Avoid Allergens and Irritants Avoid secondhand smoke Avoid SICK contacts Recommend  Masking  when appropriate Recommend Keep up-to-date with vaccinations   Be aware of reduced alertness and do not drive or operate heavy machinery if experiencing this or drowsiness.  Exercise encouraged, as tolerated. Encouraged proper weight management.  Important to get eight or more hours of sleep  Limiting the use of the computer and television before bedtime.  Decrease naps during the day, so night time sleep will become enhanced.  Limit caffeine, and sleep deprivation.

## 2023-12-24 ENCOUNTER — Other Ambulatory Visit: Payer: Self-pay | Admitting: Family Medicine

## 2023-12-29 DIAGNOSIS — G4733 Obstructive sleep apnea (adult) (pediatric): Secondary | ICD-10-CM

## 2023-12-29 DIAGNOSIS — G4730 Sleep apnea, unspecified: Secondary | ICD-10-CM | POA: Diagnosis not present

## 2023-12-30 ENCOUNTER — Other Ambulatory Visit: Payer: Self-pay | Admitting: Family Medicine

## 2023-12-30 ENCOUNTER — Encounter: Payer: Self-pay | Admitting: Family Medicine

## 2024-01-01 ENCOUNTER — Other Ambulatory Visit: Payer: Self-pay

## 2024-01-01 MED ORDER — CARISOPRODOL 350 MG PO TABS: 350.0000 mg | ORAL_TABLET | Freq: Every day | ORAL | 1 refills | Status: DC

## 2024-01-01 NOTE — Telephone Encounter (Signed)
 Sent. Thanks.

## 2024-01-01 NOTE — Addendum Note (Signed)
Addended by: Joaquim Nam on: 01/01/2024 12:45 PM   Modules accepted: Orders

## 2024-01-01 NOTE — Telephone Encounter (Signed)
Last refill: carisoprodol (SOMA) 350 MG tablet  Last office visit: 12/05/23 Next office visit: 02/12/24

## 2024-01-01 NOTE — Addendum Note (Signed)
Addended by: Lonia Blood on: 01/01/2024 08:29 AM   Modules accepted: Orders

## 2024-01-04 ENCOUNTER — Other Ambulatory Visit: Payer: Self-pay | Admitting: Family Medicine

## 2024-01-04 NOTE — Telephone Encounter (Signed)
Last refill: clobetasol ointment (TEMOVATE) 0.05 % 01/24/22 30grams 2 refills  Last office visit: 11/15/23 Next office visit: 02/12/24

## 2024-01-05 DIAGNOSIS — G4733 Obstructive sleep apnea (adult) (pediatric): Secondary | ICD-10-CM | POA: Diagnosis not present

## 2024-01-12 ENCOUNTER — Other Ambulatory Visit: Payer: Self-pay | Admitting: Family Medicine

## 2024-01-15 ENCOUNTER — Encounter: Payer: Self-pay | Admitting: Family Medicine

## 2024-01-16 ENCOUNTER — Other Ambulatory Visit: Payer: Self-pay | Admitting: Family Medicine

## 2024-01-16 MED ORDER — TRAMADOL-ACETAMINOPHEN 37.5-325 MG PO TABS: 2.0000 | ORAL_TABLET | Freq: Every day | ORAL | 1 refills | Status: DC

## 2024-01-28 ENCOUNTER — Other Ambulatory Visit: Payer: Self-pay | Admitting: Family Medicine

## 2024-01-28 DIAGNOSIS — E559 Vitamin D deficiency, unspecified: Secondary | ICD-10-CM

## 2024-01-28 DIAGNOSIS — D649 Anemia, unspecified: Secondary | ICD-10-CM

## 2024-01-28 DIAGNOSIS — I10 Essential (primary) hypertension: Secondary | ICD-10-CM

## 2024-02-02 ENCOUNTER — Other Ambulatory Visit (INDEPENDENT_AMBULATORY_CARE_PROVIDER_SITE_OTHER): Payer: Medicare Other

## 2024-02-02 DIAGNOSIS — E559 Vitamin D deficiency, unspecified: Secondary | ICD-10-CM

## 2024-02-02 DIAGNOSIS — I10 Essential (primary) hypertension: Secondary | ICD-10-CM | POA: Diagnosis not present

## 2024-02-02 DIAGNOSIS — D649 Anemia, unspecified: Secondary | ICD-10-CM | POA: Diagnosis not present

## 2024-02-02 LAB — COMPREHENSIVE METABOLIC PANEL
ALT: 14 U/L (ref 0–35)
AST: 23 U/L (ref 0–37)
Albumin: 4.2 g/dL (ref 3.5–5.2)
Alkaline Phosphatase: 49 U/L (ref 39–117)
BUN: 17 mg/dL (ref 6–23)
CO2: 31 meq/L (ref 19–32)
Calcium: 10.2 mg/dL (ref 8.4–10.5)
Chloride: 99 meq/L (ref 96–112)
Creatinine, Ser: 0.77 mg/dL (ref 0.40–1.20)
GFR: 72.78 mL/min (ref >60.00)
Glucose, Bld: 88 mg/dL (ref 70–99)
Potassium: 3.6 meq/L (ref 3.5–5.1)
Sodium: 139 meq/L (ref 135–145)
Total Bilirubin: 0.8 mg/dL (ref 0.2–1.2)
Total Protein: 6.7 g/dL (ref 6.0–8.3)

## 2024-02-02 LAB — VITAMIN D 25 HYDROXY (VIT D DEFICIENCY, FRACTURES): VITD: 47.66 ng/mL (ref 30.00–100.00)

## 2024-02-02 LAB — CBC WITH DIFFERENTIAL/PLATELET
Basophils Absolute: 0.1 10*3/uL (ref 0.0–0.1)
Basophils Relative: 0.9 % (ref 0.0–3.0)
Eosinophils Absolute: 0.3 10*3/uL (ref 0.0–0.7)
Eosinophils Relative: 4.7 % (ref 0.0–5.0)
HCT: 46.7 % — ABNORMAL HIGH (ref 36.0–46.0)
Hemoglobin: 15.7 g/dL — ABNORMAL HIGH (ref 12.0–15.0)
Lymphocytes Relative: 24.2 % (ref 12.0–46.0)
Lymphs Abs: 1.5 10*3/uL (ref 0.7–4.0)
MCHC: 33.7 g/dL (ref 30.0–36.0)
MCV: 91.1 fl (ref 78.0–100.0)
Monocytes Absolute: 0.8 10*3/uL (ref 0.1–1.0)
Monocytes Relative: 12.1 % — ABNORMAL HIGH (ref 3.0–12.0)
Neutro Abs: 3.6 10*3/uL (ref 1.4–7.7)
Neutrophils Relative %: 58.1 % (ref 43.0–77.0)
Platelets: 168 10*3/uL (ref 150.0–400.0)
RBC: 5.13 Mil/uL — ABNORMAL HIGH (ref 3.87–5.11)
RDW: 13.5 % (ref 11.5–15.5)
WBC: 6.2 10*3/uL (ref 4.0–10.5)

## 2024-02-02 LAB — LIPID PANEL
Cholesterol: 195 mg/dL (ref 0–200)
HDL: 41 mg/dL (ref >39.00)
LDL Cholesterol: 110 mg/dL — ABNORMAL HIGH (ref 0–99)
NonHDL: 154.17
Total CHOL/HDL Ratio: 5
Triglycerides: 220 mg/dL — ABNORMAL HIGH (ref 0.0–149.0)
VLDL: 44 mg/dL — ABNORMAL HIGH (ref 0.0–40.0)

## 2024-02-02 LAB — FERRITIN: Ferritin: 16.2 ng/mL (ref 10.0–291.0)

## 2024-02-08 ENCOUNTER — Ambulatory Visit: Payer: Medicare Other

## 2024-02-08 VITALS — Ht 64.0 in | Wt 163.0 lb

## 2024-02-08 DIAGNOSIS — Z0000 Encounter for general adult medical examination without abnormal findings: Secondary | ICD-10-CM | POA: Diagnosis not present

## 2024-02-08 NOTE — Progress Notes (Signed)
 Subjective:   Alyssa Nolan is a 83 y.o. who presents for a Medicare Wellness preventive visit.  Visit Complete: Virtual I connected with  Alyssa Nolan on 02/08/24 by a audio enabled telemedicine application and verified that I am speaking with the correct person using two identifiers.  Patient Location: Home  Provider Location: Office/Clinic  I discussed the limitations of evaluation and management by telemedicine. The patient expressed understanding and agreed to proceed.  Vital Signs: Because this visit was a virtual/telehealth visit, some criteria may be missing or patient reported. Any vitals not documented were not able to be obtained and vitals that have been documented are patient reported.  VideoDeclined- This patient declined Librarian, academic. Therefore the visit was completed with audio only.  Persons Participating in Visit: Patient.  AWV Questionnaire: Yes: Patient Medicare AWV questionnaire was completed by the patient on 02/05/24; I have confirmed that all information answered by patient is correct and no changes since this date.  Cardiac Risk Factors include: advanced age (>24men, >52 women);dyslipidemia;hypertension     Objective:    Today's Vitals   02/08/24 1343  Weight: 163 lb (73.9 kg)  Height: 5\' 4"  (1.626 m)   Body mass index is 27.98 kg/m.     02/08/2024    1:51 PM 02/07/2023   10:36 AM 10/20/2020    1:44 PM 12/09/2019   11:55 AM 09/25/2019    1:37 PM 12/05/2018    1:25 PM 09/26/2018    2:49 PM  Advanced Directives  Does Patient Have a Medical Advance Directive? Yes Yes Yes Yes No Yes No  Type of Estate agent of [place];Living will Healthcare Power of [place];Living will Healthcare Power of [place];Living will Healthcare Power of [place];Living will  Healthcare Power of [place];Living will   Does patient want to make changes to medical advance directive?   No - Patient declined       Copy of Healthcare Power of Attorney in Chart? No - copy requested No - copy requested No - copy requested No - copy requested  No - copy requested   Would patient like information on creating a medical advance directive?     No - Patient declined No - Patient declined No - Patient declined    Current Medications (verified) Outpatient Encounter Medications as of 02/08/2024  Medication Sig   albuterol (VENTOLIN HFA) 108 (90 Base) MCG/ACT inhaler INHALE 1-2 PUFFS INTO THE LUNGS EVERY 6 (SIX) HOURS AS NEEDED FOR WHEEZING OR SHORTNESS OF BREATH. AND COUGH   alendronate (FOSAMAX) 70 MG tablet Take 1 tablet (70 mg total) by mouth every 7 (seven) days. Take with a full glass of water on an empty stomach.   atorvastatin (LIPITOR) 20 MG tablet TAKE 1 TABLET BY MOUTH EVERY DAY   Calcium Carbonate-Vitamin D 600-400 MG-UNIT tablet Take 1 tablet by mouth 2 (two) times daily.   carisoprodol (SOMA) 350 MG tablet Take 1 tablet (350 mg total) by mouth at bedtime.   clobetasol ointment (TEMOVATE) 0.05 % APPLY TO AFFECTED AREA EVERY OTHER DAY   diclofenac Sodium (VOLTAREN) 1 % GEL APPLY 2 GRAMS TOPICALLY 4 (FOUR) TIMES DAILY AS NEEDED.   loratadine (CLARITIN) 10 MG tablet Take 10 mg by mouth daily.   meloxicam (MOBIC) 7.5 MG tablet Take 7.5 mg by mouth 2 (two) times daily.   mometasone (ELOCON) 0.1 % lotion APPLY TO ITCHY EARS TWICE DAILY FOR 12 DAYS. MAY REPEAT AS NEEDED FOR ITCHY EARS   omeprazole (PRILOSEC)  20 MG capsule TAKE 1 CAPSULE BY MOUTH EVERY DAY   OVER THE COUNTER MEDICATION Essential oil blend   polyethylene glycol powder (GLYCOLAX/MIRALAX) 17 GM/SCOOP powder Take 1 Container by mouth once.   Propylene Glycol (SYSTANE BALANCE OP) Place 1 drop into both eyes daily.   traMADol-acetaminophen (ULTRACET) 37.5-325 MG tablet Take 2 tablets by mouth at bedtime.   traZODone (DESYREL) 100 MG tablet TAKE 1 & 1/2 TABLET BY MOUTH AT NIGHT FOR SLEEP   triamterene-hydrochlorothiazide (DYAZIDE) 37.5-25 MG capsule  TAKE 1 CAPSULE BY MOUTH EVERY DAY IN THE MORNING   No facility-administered encounter medications on file as of 02/08/2024.    Allergies (verified) Codeine   History: Past Medical History:  Diagnosis Date   Allergy 1990   Codeine   Arthritis 2011   R hip injection per Dr. Ethelene Hal   Basal cell carcinoma 05/04/2010   ant and sup edge of left nose and nasal alar crease   Basal cell carcinoma 01/22/2014   right chest/excision   Basal cell carcinoma 10/28/2019   right crease nasal ala   Breast cancer (HCC) 04/28/2016   With plan to be on letrozole until 2027.  T1c, N0; ER/PR positive, HER-2/neu negative. INVASIVE DUCTAL CARCINOMA., DCIS   GERD (gastroesophageal reflux disease)    History of chickenpox    Hyperlipidemia    Hypertension    Lichen planus    Osteopenia    Tscore -1, consider repeat in ~2013, per Dr. Logan Bores with gyn   Personal history of radiation therapy 2017   MammoSite   Skin cancer    BCC, SCC on nose   Squamous cell carcinoma of skin 03/29/2010   left nose ant alar crease/in situ   Squamous cell carcinoma of skin 12/30/2013   left prox zygoma/in situ   Past Surgical History:  Procedure Laterality Date   BASAL CELL CARCINOMA EXCISION  01/22/2014   chest area. Dr Gwen Pounds   BREAST BIOPSY Right 2013   CORE - NEG, cyst with ductal hyperplasia without atypia Dr Lemar Livings   BREAST BIOPSY Right 04/28/2016   Invasive ductal carcinoma   BREAST CYST ASPIRATION Bilateral    BREAST EXCISIONAL BIOPSY Right 1968   NEG   BREAST LUMPECTOMY Right 2017   IMC/DCIS, negative LNs   BREAST LUMPECTOMY WITH NEEDLE LOCALIZATION Right 06/10/2016   Procedure: BREAST LUMPECTOMY WITH NEEDLE LOCALIZATION;  Surgeon: Earline Mayotte, MD;  Location: [facility] ORS;  Service: General;  Laterality: Right;   BREAST LUMPECTOMY WITH SENTINEL LYMPH NODE BIOPSY Right 06/10/2016   Procedure: BREAST LUMPECTOMY WITH SENTINEL LYMPH NODE BX / WITH RECONSTRUCTION;  Surgeon: Earline Mayotte, MD;   Location: [facility] ORS;  Service: General;  Laterality: Right;   BREAST SURGERY  1968   Fibroadenoma   CARDIOVASCULAR STRESS TEST  02/07/2002   CESAREAN SECTION  1972 & 1975   CHOLECYSTECTOMY  2005   Dr Arlyss Repress, [place]   COLONOSCOPY  04/22/2002   Dr Dollene Primrose, [place]   COLONOSCOPY  09/02/2012   Dr Lemar Livings   Endometrial ablation and right ovary removal  2002   MRI, hip  01/12/2004   MRI, neck  06/23/1998  10/31/2003   OOPHORECTOMY Bilateral 15+ YRS AGO   Removal of excess eyelids and shortening of eyelid tendons  01/2010   Removal of squamous and basal cell carcinoma from nose  04/2010   Right hip injection  03/30/2009   SKIN SURGERY  10/28/2019   removal of basal cell   Touch Up of excess eyelids  04/20/2010  TUBAL LIGATION  1975   Family History  Problem Relation Age of Onset   COPD Mother        Smoker   Heart disease Father        Possible CHF   Stroke Maternal Grandfather 33       CVA   Colon cancer Other        mother's 1/2 sister   Breast cancer Neg Hx    Diabetes Neg Hx    Liver disease Neg Hx    Pancreatic cancer Neg Hx    Esophageal cancer Neg Hx    Stomach cancer Neg Hx    Social History   Socioeconomic History   Marital status: Widowed    Spouse name: Not on file   Number of children: 2   Years of education: Not on file   Highest education level: Bachelor's degree (e.g., BA, AB, BS)  Occupational History   Occupation: Retired since 2007 and moved to [place]    Employer: RETIRED  Tobacco Use   Smoking status: Former    Current packs/day: 0.00    Types: Cigarettes    Quit date: 11/14/1969    Years since quitting: 54.2   Smokeless tobacco: Never  Vaping Use   Vaping status: Never Used  Substance and Sexual Activity   Alcohol use: Not Currently   Drug use: No   Sexual activity: Not Currently    Birth control/protection: Post-menopausal  Other Topics Concern   Not on file  Social History Narrative   From [place]   College:  [employer] grad   2 kids in  [place]   Plays cards and likes to [employer]   Social Drivers of Health   Financial Resource Strain: Low Risk  (02/08/2024)   Overall Financial Resource Strain (CARDIA)    Difficulty of Paying Living Expenses: Not hard at all  Food Insecurity: No Food Insecurity (02/08/2024)   Hunger Vital Sign    Worried About Running Out of Food in the Last Year: Never true    Ran Out of Food in the Last Year: Never true  Transportation Needs: No Transportation Needs (02/08/2024)   PRAPARE - Administrator, Civil Service (Medical): No    Lack of Transportation (Non-Medical): No  Physical Activity: Sufficiently Active (02/08/2024)   Exercise Vital Sign    Days of Exercise per Week: 3 days    Minutes of Exercise per Session: 50 min  Stress: No Stress Concern Present (02/08/2024)   [employer] of Occupational Health - Occupational Stress Questionnaire    Feeling of Stress : Not at all  Social Connections: Moderately Integrated (02/08/2024)   Social Connection and Isolation Panel [NHANES]    Frequency of Communication with Friends and Family: More than three times a week    Frequency of Social Gatherings with Friends and Family: More than three times a week    Attends Religious Services: More than 4 times per year    Active Member of [employer] or Organizations: Yes    Attends Banker Meetings: More than 4 times per year    Marital Status: Widowed    Tobacco Counseling Counseling given: Not Answered    Clinical Intake:  Pre-visit preparation completed: Yes  Pain : No/denies pain    BMI - recorded: 27.98 Nutritional Status: BMI 25 -29 Overweight Nutritional Risks: None Diabetes: No  No results found for: "HGBA1C"   How often do you need to have someone help you when you read  instructions, pamphlets, or other written materials from your doctor or pharmacy?: 1 - Never  Interpreter Needed?: No  Comments: lives alone Information entered by ::  B.Anner Baity,LPN   Activities of Daily Living     02/05/2024    7:26 AM  In your present state of health, do you have any difficulty performing the following activities:  Hearing? 0  Vision? 0  Difficulty concentrating or making decisions? 0  Walking or climbing stairs? 0  Dressing or bathing? 0  Doing errands, shopping? 0  Preparing Food and eating ? N  Using the Toilet? N  In the past six months, have you accidently leaked urine? N  Do you have problems with loss of bowel control? N  Managing your Medications? N  Managing your Finances? N  Housekeeping or managing your Housekeeping? N    Patient Care Team: Joaquim Nam, MD as PCP - General Defrancesco, Prentice Docker, MD as Referring Physician (Obstetrics and Gynecology) Lemar Livings Merrily Pew, MD as Consulting Physician (General Surgery) Deirdre Evener, MD as Consulting Physician (Dermatology) Ranee Gosselin, MD as Consulting Physician (Orthopedic Surgery) Eli Phillips, OD as Consulting Physician (Optometry) Pyrtle, Carie Caddy, MD as Consulting Physician (Gastroenterology) Zehr, Princella Pellegrini, PA-C as Physician Assistant (Gastroenterology)  Indicate any recent Medical Services you may have received from other than [facility] providers in the past year (date may be approximate).     Assessment:   This is a routine wellness examination for Alyssa Nolan.  Hearing/Vision screen Hearing Screening - Comments:: Pt says she has hearing aids and works most of the time Vision Screening - Comments:: Pt says her vision is fine w/glasses Dr Merla Riches   Goals Addressed             This Visit's Progress    Increase physical activity       02/08/24- I will continue to exercise for at least 60 minutes 3 days per week.      Patient Stated   On track    02/08/24- I will maintain and continue medications as prescribed.     Patient Stated       02/08/24-Eat less sweets and lose 5 lb.       Depression Screen     02/08/2024    1:47 PM  07/06/2023    4:11 PM 02/09/2023   11:11 AM 02/07/2023   10:35 AM 01/31/2022    2:46 PM 01/25/2021    2:33 PM 12/09/2019   11:56 AM  PHQ 2/9 Scores  PHQ - 2 Score 0 1 0 0 0 0 0  PHQ- 9 Score  4 2    0    Fall Risk     02/05/2024    7:26 AM 07/06/2023    4:11 PM 02/09/2023   11:10 AM 02/07/2023   10:37 AM 01/31/2022    2:46 PM  Fall Risk   Falls in the past year? 0 0 0 0 0  Number falls in past yr:  0 0 0 0  Injury with Fall?  0 0 0 0  Risk for fall due to : No Fall Risks No Fall Risks No Fall Risks No Fall Risks No Fall Risks  Follow up Education provided;Falls prevention discussed Falls evaluation completed Falls evaluation completed Falls prevention discussed;Falls evaluation completed Falls evaluation completed    MEDICARE RISK AT HOME:  Medicare Risk at Home Any stairs in or around the home?: (Patient-Rptd) Yes If so, are there any without handrails?: (Patient-Rptd) No Home free of loose throw  rugs in walkways, pet beds, electrical cords, etc?: (Patient-Rptd) Yes Adequate lighting in your home to reduce risk of falls?: (Patient-Rptd) Yes Life alert?: (Patient-Rptd) No Use of a cane, walker or w/c?: (Patient-Rptd) No Grab bars in the bathroom?: (Patient-Rptd) Yes Shower chair or bench in shower?: (Patient-Rptd) No Elevated toilet seat or a handicapped toilet?: (Patient-Rptd) Yes  TIMED UP AND GO:  Was the test performed?  No  Cognitive Function: 6CIT completed    12/09/2019   12:00 PM 12/05/2018    1:25 PM 10/25/2016   11:00 AM  MMSE - Mini Mental State Exam  Orientation to time 5 5 5   Orientation to Place 5 5 5   Registration 3 3 3   Attention/ Calculation 5 0 0  Recall 3 3 3   Language- name 2 objects  0 0  Language- repeat 1 1 1   Language- follow 3 step command  3 3  Language- read & follow direction  0 0  Write a sentence  0 0  Copy design  0 0  Total score  20 20        02/08/2024    1:54 PM 02/07/2023   10:39 AM  6CIT Screen  What Year? 0 points 0 points   What month? 0 points 0 points  What time? 0 points 0 points  Count back from 20 0 points 0 points  Months in reverse 0 points 0 points  Repeat phrase 0 points 2 points  Total Score 0 points 2 points    Immunizations Immunization History  Administered Date(s) Administered   Fluad Quad(high Dose 65+) 08/05/2019, 09/02/2022   Influenza Split 08/10/2011, 08/10/2012   Influenza Whole 08/06/2010   Influenza, High Dose Seasonal PF 08/08/2018, 08/06/2021, 08/14/2023   Influenza,inj,Quad PF,6+ Mos 09/12/2013, 07/30/2014, 08/07/2015, 09/15/2016, 09/08/2017   Influenza-Unspecified 08/05/2019, 08/19/2020   PFIZER Comirnaty(Gray Top)Covid-19 Tri-Sucrose Vaccine 02/26/2021   PFIZER(Purple Top)SARS-COV-2 Vaccination 12/06/2019, 12/27/2019, 09/04/2020   Pfizer Covid-19 Vaccine Bivalent Booster 75yrs & up 09/03/2021   Pfizer(Comirnaty)Fall Seasonal Vaccine 12 years and older 08/27/2022, 08/14/2023   Pneumococcal Conjugate-13 09/18/2014   Pneumococcal Polysaccharide-23 01/25/2011   Td 07/07/2010   Tdap 02/07/2022   Zoster Recombinant(Shingrix) 09/04/2019, 02/20/2020   Zoster, Live 08/09/2010    Screening Tests Health Maintenance  Topic Date Due   COVID-19 Vaccine (8 - Pfizer risk 2024-25 season) 02/11/2024   Medicare Annual Wellness (AWV)  02/07/2025   DTaP/Tdap/Td (3 - Td or Tdap) 02/08/2032   Pneumonia Vaccine 10+ Years old  Completed   INFLUENZA VACCINE  Completed   DEXA SCAN  Completed   Zoster Vaccines- Shingrix  Completed   HPV VACCINES  Aged Out   Colonoscopy  Discontinued    Health Maintenance  Health Maintenance Due  Topic Date Due   COVID-19 Vaccine (8 - Pfizer risk 2024-25 season) 02/11/2024   Health Maintenance Items Addressed: None needed  Additional Screening:  Vision Screening: Recommended annual ophthalmology exams for early detection of glaucoma and other disorders of the eye.  Dental Screening: Recommended annual dental exams for proper oral  hygiene  Community Resource Referral / Chronic Care Management: CRR required this visit?  No   CCM required this visit?  No     Plan:     I have personally reviewed and noted the following in the patient's chart:   Medical and social history Use of alcohol, tobacco or illicit drugs  Current medications and supplements including opioid prescriptions. Patient is not currently taking opioid prescriptions. Functional ability and status Nutritional status Physical  activity Advanced directives List of other physicians Hospitalizations, surgeries, and ER visits in previous 12 months Vitals Screenings to include cognitive, depression, and falls Referrals and appointments  In addition, I have reviewed and discussed with patient certain preventive protocols, quality metrics, and best practice recommendations. A written personalized care plan for preventive services as well as general preventive health recommendations were provided to patient.     Sue Lush, LPN   [id number]   After Visit Summary: (MyChart) Due to this being a telephonic visit, the after visit summary with patients personalized plan was offered to patient via MyChart   Notes: Nothing significant to report at this time.

## 2024-02-08 NOTE — Patient Instructions (Signed)
 Alyssa Nolan , Thank you for taking time to come for your Medicare Wellness Visit. I appreciate your ongoing commitment to your health goals. Please review the following plan we discussed and let me know if I can assist you in the future.   Referrals/Orders/Follow-Ups/Clinician Recommendations: none  This is a list of the screening recommended for you and due dates:  Health Maintenance  Topic Date Due   COVID-19 Vaccine (8 - Pfizer risk 2024-25 season) 02/11/2024   Medicare Annual Wellness Visit  02/07/2025   DTaP/Tdap/Td vaccine (3 - Td or Tdap) 02/08/2032   Pneumonia Vaccine  Completed   Flu Shot  Completed   DEXA scan (bone density measurement)  Completed   Zoster (Shingles) Vaccine  Completed   HPV Vaccine  Aged Out   Colon Cancer Screening  Discontinued    Advanced directives: (In Chart) A copy of your advanced directives are scanned into your chart should your provider ever need it.  Next Medicare Annual Wellness Visit scheduled for next year: Yes 02/12/2025 @ 1pm televisit

## 2024-02-09 ENCOUNTER — Encounter: Payer: Medicare Other | Admitting: Family Medicine

## 2024-02-12 ENCOUNTER — Encounter: Payer: Self-pay | Admitting: Family Medicine

## 2024-02-12 ENCOUNTER — Ambulatory Visit (INDEPENDENT_AMBULATORY_CARE_PROVIDER_SITE_OTHER): Payer: Medicare Other | Admitting: Family Medicine

## 2024-02-12 VITALS — BP 124/62 | HR 78 | Temp 98.4°F | Ht 62.91 in | Wt 164.0 lb

## 2024-02-12 DIAGNOSIS — M545 Low back pain, unspecified: Secondary | ICD-10-CM

## 2024-02-12 DIAGNOSIS — G4700 Insomnia, unspecified: Secondary | ICD-10-CM

## 2024-02-12 DIAGNOSIS — M810 Age-related osteoporosis without current pathological fracture: Secondary | ICD-10-CM

## 2024-02-12 DIAGNOSIS — E785 Hyperlipidemia, unspecified: Secondary | ICD-10-CM | POA: Diagnosis not present

## 2024-02-12 DIAGNOSIS — Z0000 Encounter for general adult medical examination without abnormal findings: Secondary | ICD-10-CM

## 2024-02-12 DIAGNOSIS — I10 Essential (primary) hypertension: Secondary | ICD-10-CM

## 2024-02-12 DIAGNOSIS — R0982 Postnasal drip: Secondary | ICD-10-CM

## 2024-02-12 DIAGNOSIS — Z7189 Other specified counseling: Secondary | ICD-10-CM

## 2024-02-12 DIAGNOSIS — K2270 Barrett's esophagus without dysplasia: Secondary | ICD-10-CM | POA: Diagnosis not present

## 2024-02-12 MED ORDER — TRAZODONE HCL 100 MG PO TABS: ORAL_TABLET | ORAL | 3 refills | Status: AC

## 2024-02-12 MED ORDER — FLUTICASONE PROPIONATE 50 MCG/ACT NA SUSP: 2.0000 | Freq: Every day | NASAL | 6 refills | Status: DC

## 2024-02-12 NOTE — Progress Notes (Unsigned)
 AM post nasal gtt.  Year round.  Not seasonal.    Hypertension:               Using medication without problems or lightheadedness: yes Chest pain with exertion:no Edema:no Short of breath: only with sig exertion, stable.     Elevated Cholesterol: Using medications without problems: yes Muscle aches: no Diet compliance: yes Exercise:d/w pt.  Limited by back pain.     Not anemic, d/w pt.  Labs d/w pt.  Not on iron currently.   Osteoporosis.  Fosamax started 10/2023.  No ADE on med.  Compliant.  H/o Barrett's, on PPI.  Swallowing well.  Compliant with PPI.   Insomnia improved with trazodone.  Some nights with dec sleep but overall improved.  No ADE on med.   Flu 2024 Shingles previously done PNA previously done Tetanus 2023 COVID-vaccine previously done RSV d/w pt.   Colonoscopy 2023 Breast cancer screening 2024 Bone density test 2024 Advance directive-daughter designated if patient were incapacitated.  She is going to follow up with Dr. Ethelene Hal about her back prior to travelling to [place] later this year.   Still taking soma/meloxicam/ultracet.  No ADE on med.  Some days with more pain than others.  Not sedated.  Sitting down helps the pain.  Using cane as needed.    Meds, vitals, and allergies reviewed.   ROS: Per HPI unless specifically indicated in ROS section   GEN: nad, alert and oriented HEENT: mucous membranes moist NECK: supple w/o LA CV: rrr. PULM: ctab, no inc wob ABD: soft, +bs EXT: no edema SKIN: well perfused.

## 2024-02-12 NOTE — Patient Instructions (Addendum)
 Try taking flonase and see if that helps.  Update me as needed.  Take care.  Glad to see you. RSV vaccine when possible.

## 2024-02-14 DIAGNOSIS — R0982 Postnasal drip: Secondary | ICD-10-CM | POA: Insufficient documentation

## 2024-02-14 NOTE — Assessment & Plan Note (Signed)
 She could try adding on Flonase and update me as needed.

## 2024-02-14 NOTE — Assessment & Plan Note (Signed)
 Flu 2024 Shingles previously done PNA previously done Tetanus 2023 COVID-vaccine previously done RSV d/w pt.   Colonoscopy 2023 Breast cancer screening 2024 Bone density test 2024 Advance directive-daughter designated if patient were incapacitated.

## 2024-02-14 NOTE — Assessment & Plan Note (Signed)
 Fosamax started 10/2023.  No ADE on med.  Compliant.  Continue as is.

## 2024-02-14 NOTE — Assessment & Plan Note (Signed)
 See above.  She is going to check with Dr. Ethelene Hal.  Continue Soma meloxicam and Ultracet.  Not sedated.  She is putting up with her current pain level.  She is using the cane as needed.

## 2024-02-14 NOTE — Assessment & Plan Note (Signed)
 Continue omeprazole

## 2024-02-14 NOTE — Assessment & Plan Note (Signed)
Advance directive- daughter designated if patient were incapacitated.   

## 2024-02-14 NOTE — Assessment & Plan Note (Signed)
 Continue trazodone as is.

## 2024-02-14 NOTE — Assessment & Plan Note (Signed)
 Continue atorvastatin

## 2024-02-14 NOTE — Assessment & Plan Note (Signed)
Continue triamterene-hydrochlorothiazide

## 2024-04-03 ENCOUNTER — Other Ambulatory Visit (HOSPITAL_COMMUNITY): Payer: Self-pay

## 2024-04-03 ENCOUNTER — Telehealth: Payer: Self-pay

## 2024-04-03 NOTE — Telephone Encounter (Signed)
 Pharmacy Patient Advocate Encounter  Received notification from [employer] that Prior Authorization for Carisoprodol  350MG  tablets has been APPROVED from 01/04/24 to 07/02/24. Unable to obtain price due to refill too soon rejection, last fill date 04/03/24 next available fill date08/01/25   PA #/Case ID/Reference #: [id number]

## 2024-04-03 NOTE — Telephone Encounter (Signed)
 Pharmacy Patient Advocate Encounter   Received notification from CoverMyMeds that prior authorization for Carisoprodol  350MG  tablets is required/requested.   Insurance verification completed.   The patient is insured through [facility] .   Per test claim: PA required; PA submitted to above mentioned insurance via CoverMyMeds Key/confirmation #/EOC [id number] Status is pending

## 2024-05-23 DIAGNOSIS — M549 Dorsalgia, unspecified: Secondary | ICD-10-CM | POA: Diagnosis not present

## 2024-05-23 DIAGNOSIS — M5416 Radiculopathy, lumbar region: Secondary | ICD-10-CM | POA: Diagnosis not present

## 2024-06-07 ENCOUNTER — Ambulatory Visit (INDEPENDENT_AMBULATORY_CARE_PROVIDER_SITE_OTHER)
Admission: RE | Admit: 2024-06-07 | Discharge: 2024-06-07 | Disposition: A | Discharge status: Home / Self Care | Source: Ambulatory Visit | Attending: Nurse Practitioner | Admitting: Nurse Practitioner

## 2024-06-07 ENCOUNTER — Ambulatory Visit: Admitting: Nurse Practitioner

## 2024-06-07 VITALS — BP 136/68 | HR 80 | Temp 98.3°F | Ht 62.91 in | Wt 162.6 lb

## 2024-06-07 DIAGNOSIS — R0689 Other abnormalities of breathing: Secondary | ICD-10-CM

## 2024-06-07 DIAGNOSIS — J0101 Acute recurrent maxillary sinusitis: Secondary | ICD-10-CM | POA: Diagnosis not present

## 2024-06-07 DIAGNOSIS — R051 Acute cough: Secondary | ICD-10-CM | POA: Diagnosis not present

## 2024-06-07 LAB — POC COVID19 BINAXNOW: SARS Coronavirus 2 Ag: NEGATIVE

## 2024-06-07 MED ORDER — DOXYCYCLINE HYCLATE 100 MG PO TABS
100.0000 mg | ORAL_TABLET | Freq: Two times a day (BID) | ORAL | 0 refills | Status: AC
Start: 2024-06-07 — End: 2024-06-14

## 2024-06-07 NOTE — Progress Notes (Signed)
 Acute Office Visit  Subjective:     Patient ID: Alyssa  [name], female    DOB: 1941-07-23, 83 y.o.   MRN: [id number]  Chief Complaint  Patient presents with   Sinus Problem    Pt complains of Left side facial pain/pressure. Left eye was crusty this morning, Monday and Tuesday morning pt complains of thick mucus while blowing nose. Prod cough. No chills, fever, body aches, no sore throat.  Pt states of no cough anymore. Symptoms started less than a week.      Patient is in today for symptoms with a history of HTN, OA, HLD, breast cancer, osteoporosis.  Symptoms started: Monday [place] Several family members have been sick. She calls it a cold and the flu.   Covid vaccine: original with boosters Flu vaccine:08/14/2023, out of season  Pna vaccine: 2015  States that she has not tried anything over the counter. States that she never been sick much   Review of Systems  Constitutional:  Positive for malaise/fatigue. Negative for chills and fever.  HENT:  Positive for ear pain and sinus pain. Negative for sore throat.   Respiratory:  Positive for cough (worse at night) and sputum production. Negative for shortness of breath.   Gastrointestinal:  Negative for abdominal pain, diarrhea, nausea and vomiting.  Musculoskeletal:  Negative for myalgias.  Neurological:  Positive for headaches.        Objective:    BP 136/68   Pulse 80   Temp 98.3 F (36.8 C) (Oral)   Ht 5' 2.91 (1.598 m)   Wt 162 lb 9.6 oz (73.8 kg)   SpO2 99%   BMI 28.89 kg/m    Physical Exam Vitals and nursing note reviewed.  Constitutional:      Appearance: Normal appearance.  HENT:     Right Ear: Tympanic membrane, ear canal and external ear normal.     Left Ear: Tympanic membrane, ear canal and external ear normal.     Nose:     Right Sinus: Maxillary sinus tenderness and frontal sinus tenderness present.     Left Sinus: No maxillary sinus  tenderness or frontal sinus tenderness.     Mouth/Throat:     Mouth: Mucous membranes are moist.     Pharynx: Oropharynx is clear.  Eyes:     Conjunctiva/sclera:     Left eye: Exudate present.   Cardiovascular:     Rate and Rhythm: Normal rate and regular rhythm.     Heart sounds: Normal heart sounds.  Pulmonary:     Effort: Pulmonary effort is normal.     Breath sounds: Rales (LLL) present.  Neurological:     Mental Status: She is alert.     Results for orders placed or performed in visit on 06/07/24  POC COVID-19 BinaxNow  Result Value Ref Range   SARS Coronavirus 2 Ag Negative Negative        Assessment & Plan:   Problem List Items Addressed This Visit       Respiratory   Acute recurrent maxillary sinusitis   Given patient's age comorbidities and exam we will elect to treat with doxycycline  100 mg twice daily.  Patient recommended fluids use Mucinex as needed along with Flonase  as prescribed.  Patient was cautioned about photosensitivity with doxycycline  use  COVID test negative in office      Relevant Medications   doxycycline  (VIBRA -TABS) 100 MG tablet     Other  Cough - Primary   COVID test in office.      Relevant Orders   POC COVID-19 BinaxNow (Completed)   DG Chest 2 View   Adventitious breath sounds   Pending chest x-ray      Relevant Orders   DG Chest 2 View    Meds ordered this encounter  Medications   doxycycline  (VIBRA -TABS) 100 MG tablet    Sig: Take 1 tablet (100 mg total) by mouth 2 (two) times daily for 7 days.    Dispense:  14 tablet    Refill:  0    Supervising Provider:   RANDEEN HARDY A [[id number]]    Return if symptoms worsen or fail to improve.  Adina Crandall, NP

## 2024-06-07 NOTE — Assessment & Plan Note (Signed)
 COVID test in office.

## 2024-06-07 NOTE — Patient Instructions (Signed)
 Nice to see you today  Get plain mucinex over the counter.  Drink plenty of fluid Start using the flonase  Use warm salt water gargles to help the spot on the roof of your mouth

## 2024-06-07 NOTE — Assessment & Plan Note (Signed)
Pending chest x-ray.

## 2024-06-07 NOTE — Assessment & Plan Note (Addendum)
 Given patient's age comorbidities and exam we will elect to treat with doxycycline  100 mg twice daily.  Patient recommended fluids use Mucinex as needed along with Flonase  as prescribed.  Patient was cautioned about photosensitivity with doxycycline  use  COVID test negative in office

## 2024-06-11 ENCOUNTER — Other Ambulatory Visit: Payer: Self-pay | Admitting: Surgery

## 2024-06-11 ENCOUNTER — Ambulatory Visit: Payer: Self-pay | Admitting: Nurse Practitioner

## 2024-06-11 DIAGNOSIS — Z1231 Encounter for screening mammogram for malignant neoplasm of breast: Secondary | ICD-10-CM

## 2024-06-21 ENCOUNTER — Ambulatory Visit: Admitting: Family Medicine

## 2024-06-28 ENCOUNTER — Encounter: Payer: Self-pay | Admitting: Family Medicine

## 2024-06-29 ENCOUNTER — Other Ambulatory Visit: Payer: Self-pay | Admitting: Family Medicine

## 2024-06-30 ENCOUNTER — Encounter: Payer: Self-pay | Admitting: Family Medicine

## 2024-07-01 MED ORDER — CARISOPRODOL 350 MG PO TABS: 350.0000 mg | ORAL_TABLET | Freq: Every day | ORAL | 1 refills | Status: DC

## 2024-07-09 ENCOUNTER — Other Ambulatory Visit: Payer: Self-pay | Admitting: Family Medicine

## 2024-07-09 DIAGNOSIS — M1990 Unspecified osteoarthritis, unspecified site: Secondary | ICD-10-CM

## 2024-07-10 NOTE — Telephone Encounter (Signed)
 Name of Medication:  Tramadol -APAP Name of Pharmacy:  [facility] Last Fill or Written Date and Quantity:  04/12/24, #180 Last Office Visit and Type:  02/12/24, CPE Next Office Visit and Type:  none Last Controlled Substance Agreement Date:  none Last UDS:  none

## 2024-07-11 ENCOUNTER — Encounter: Payer: Self-pay | Admitting: Family Medicine

## 2024-07-16 ENCOUNTER — Ambulatory Visit: Payer: Self-pay | Admitting: Internal Medicine

## 2024-07-22 ENCOUNTER — Encounter

## 2024-07-23 DIAGNOSIS — M5416 Radiculopathy, lumbar region: Secondary | ICD-10-CM | POA: Diagnosis not present

## 2024-07-24 ENCOUNTER — Encounter: Payer: Self-pay | Admitting: Dermatology

## 2024-07-24 ENCOUNTER — Ambulatory Visit (INDEPENDENT_AMBULATORY_CARE_PROVIDER_SITE_OTHER): Payer: Medicare Other | Admitting: Dermatology

## 2024-07-24 DIAGNOSIS — L570 Actinic keratosis: Secondary | ICD-10-CM | POA: Diagnosis not present

## 2024-07-24 DIAGNOSIS — Z86007 Personal history of in-situ neoplasm of skin: Secondary | ICD-10-CM | POA: Diagnosis not present

## 2024-07-24 DIAGNOSIS — D1801 Hemangioma of skin and subcutaneous tissue: Secondary | ICD-10-CM

## 2024-07-24 DIAGNOSIS — L814 Other melanin hyperpigmentation: Secondary | ICD-10-CM | POA: Diagnosis not present

## 2024-07-24 DIAGNOSIS — L578 Other skin changes due to chronic exposure to nonionizing radiation: Secondary | ICD-10-CM

## 2024-07-24 DIAGNOSIS — D229 Melanocytic nevi, unspecified: Secondary | ICD-10-CM

## 2024-07-24 DIAGNOSIS — D692 Other nonthrombocytopenic purpura: Secondary | ICD-10-CM

## 2024-07-24 DIAGNOSIS — Z85828 Personal history of other malignant neoplasm of skin: Secondary | ICD-10-CM | POA: Diagnosis not present

## 2024-07-24 DIAGNOSIS — W908XXA Exposure to other nonionizing radiation, initial encounter: Secondary | ICD-10-CM

## 2024-07-24 DIAGNOSIS — L820 Inflamed seborrheic keratosis: Secondary | ICD-10-CM | POA: Diagnosis not present

## 2024-07-24 DIAGNOSIS — Z1283 Encounter for screening for malignant neoplasm of skin: Secondary | ICD-10-CM | POA: Diagnosis not present

## 2024-07-24 DIAGNOSIS — Z8589 Personal history of malignant neoplasm of other organs and systems: Secondary | ICD-10-CM

## 2024-07-24 DIAGNOSIS — L821 Other seborrheic keratosis: Secondary | ICD-10-CM | POA: Diagnosis not present

## 2024-07-24 NOTE — Progress Notes (Unsigned)
 Follow-Up Visit   Subjective  Alyssa Nolan is a 83 y.o. female who presents for the following: Skin Cancer Screening and Full Body Skin Exam, hx of BCCs, SCC IS, Aks, check rough spot R chest, L chest, check spot L arm picks at, painful, check spot R post shoulder painful, check spots L nose, above L brow, itchy eyebrows  The patient presents for Total-Body Skin Exam (TBSE) for skin cancer screening and mole check. The patient has spots, moles and lesions to be evaluated, some may be new or changing and the patient may have concern these could be cancer.  The following portions of the chart were reviewed this encounter and updated as appropriate: medications, allergies, medical history  Review of Systems:  No other skin or systemic complaints except as noted in HPI or Assessment and Plan.  Objective  Well appearing patient in no apparent distress; mood and affect are within normal limits.  A full examination was performed including scalp, head, eyes, ears, nose, lips, neck, chest, axillae, abdomen, back, buttocks, bilateral upper extremities, bilateral lower extremities, hands, feet, fingers, toes, fingernails, and toenails. All findings within normal limits unless otherwise noted below.   Relevant physical exam findings are noted in the Assessment and Plan.  R post shoulder x 1 Pink scaly macules chest x 2, L forearm x 1 Stuck on waxy paps with erythema  Assessment & Plan   SKIN CANCER SCREENING PERFORMED TODAY.  ACTINIC DAMAGE - Chronic condition, secondary to cumulative UV/sun exposure - diffuse scaly erythematous macules with underlying dyspigmentation - Recommend daily broad spectrum sunscreen SPF 30+ to sun-exposed areas, reapply every 2 hours as needed.  - Staying in the shade or wearing long sleeves, sun glasses (UVA+UVB protection) and wide brim hats (4-inch brim around the entire circumference of the hat) are also recommended for sun protection.  - Call for new or  changing lesions.  LENTIGINES, SEBORRHEIC KERATOSES, HEMANGIOMAS - Benign normal skin lesions - Benign-appearing - Call for any changes  MELANOCYTIC NEVI - Tan-brown and/or pink-flesh-colored symmetric macules and papules - Benign appearing on exam today - Observation - Call clinic for new or changing moles - Recommend daily use of broad spectrum spf 30+ sunscreen to sun-exposed areas.   HISTORY OF BASAL CELL CARCINOMA OF THE SKIN - No evidence of recurrence today - Recommend regular full body skin exams - Recommend daily broad spectrum sunscreen SPF 30+ to sun-exposed areas, reapply every 2 hours as needed.  - Call if any new or changing lesions are noted between office visits  - Ant and sup edge of L nose and nasal alar crease, R chest, R crease nasal ala  HISTORY OF SQUAMOUS CELL CARCINOMA IN SITU OF THE SKIN - No evidence of recurrence today - Recommend regular full body skin exams - Recommend daily broad spectrum sunscreen SPF 30+ to sun-exposed areas, reapply every 2 hours as needed.  - Call if any new or changing lesions are noted between office visits  - L nose ant alar crease, L prox zygoma  Purpura - Chronic; persistent and recurrent.  Treatable, but not curable. - Violaceous macules and patches - Benign - Related to trauma, age, sun damage and/or use of blood thinners, chronic use of topical and/or oral steroids - Observe - Can use OTC arnica containing moisturizer such as Dermend Bruise Formula if desired - Call for worsening or other concerns  AK (ACTINIC KERATOSIS) R post shoulder x 1 Will plan to treat AK L medial brow and  nose on f/u after her trip to [place] (leaving tomorrow)  Actinic keratoses are precancerous spots that appear secondary to cumulative UV radiation exposure/sun exposure over time. They are chronic with expected duration over 1 year. A portion of actinic keratoses will progress to squamous cell carcinoma of the skin. It is not possible to  reliably predict which spots will progress to skin cancer and so treatment is recommended to prevent development of skin cancer.  Recommend daily broad spectrum sunscreen SPF 30+ to sun-exposed areas, reapply every 2 hours as needed.  Recommend staying in the shade or wearing long sleeves, sun glasses (UVA+UVB protection) and wide brim hats (4-inch brim around the entire circumference of the hat). Call for new or changing lesions. Destruction of lesion - R post shoulder x 1 Complexity: simple   Destruction method: cryotherapy   Informed consent: discussed and consent obtained   Timeout:  patient name, date of birth, surgical site, and procedure verified Lesion destroyed using liquid nitrogen: Yes   Region frozen until ice ball extended beyond lesion: Yes   Outcome: patient tolerated procedure well with no complications   Post-procedure details: wound care instructions given    INFLAMED SEBORRHEIC KERATOSIS chest x 2, L forearm x 1 Symptomatic, irritating, patient would like treated.  Plan to treat ISKs face, legs on f/u Destruction of lesion - chest x 2, L forearm x 1 Complexity: simple   Destruction method: cryotherapy   Informed consent: discussed and consent obtained   Timeout:  patient name, date of birth, surgical site, and procedure verified Lesion destroyed using liquid nitrogen: Yes   Region frozen until ice ball extended beyond lesion: Yes   Outcome: patient tolerated procedure well with no complications   Post-procedure details: wound care instructions given    Return in about 8 weeks (around 09/18/2024) for treat AKs L med brow and nose, ISKs face legs.  I, Grayce Saunas, RMA, am acting as scribe for Alm Rhyme, MD .   Documentation: I have reviewed the above documentation for accuracy and completeness, and I agree with the above.  Alm Rhyme, MD

## 2024-07-24 NOTE — Patient Instructions (Addendum)

## 2024-07-25 ENCOUNTER — Encounter: Payer: Self-pay | Admitting: Dermatology

## 2024-08-08 ENCOUNTER — Other Ambulatory Visit: Payer: Self-pay | Admitting: Family Medicine

## 2024-08-12 ENCOUNTER — Encounter

## 2024-08-30 ENCOUNTER — Encounter: Payer: Self-pay | Admitting: Family Medicine

## 2024-08-30 DIAGNOSIS — Z23 Encounter for immunization: Secondary | ICD-10-CM | POA: Diagnosis not present

## 2024-08-30 NOTE — Telephone Encounter (Signed)
 Not showing in ncir. Will hold to check later.

## 2024-09-02 ENCOUNTER — Ambulatory Visit
Admission: RE | Admit: 2024-09-02 | Discharge: 2024-09-02 | Disposition: A | Discharge status: Home / Self Care | Source: Ambulatory Visit | Attending: Surgery | Admitting: Surgery

## 2024-09-02 DIAGNOSIS — Z1231 Encounter for screening mammogram for malignant neoplasm of breast: Secondary | ICD-10-CM | POA: Diagnosis not present

## 2024-09-09 DIAGNOSIS — Z853 Personal history of malignant neoplasm of breast: Secondary | ICD-10-CM | POA: Diagnosis not present

## 2024-09-09 DIAGNOSIS — Z08 Encounter for follow-up examination after completed treatment for malignant neoplasm: Secondary | ICD-10-CM | POA: Diagnosis not present

## 2024-09-18 ENCOUNTER — Encounter: Payer: Self-pay | Admitting: Dermatology

## 2024-09-18 ENCOUNTER — Ambulatory Visit (INDEPENDENT_AMBULATORY_CARE_PROVIDER_SITE_OTHER): Admitting: Dermatology

## 2024-09-18 DIAGNOSIS — L570 Actinic keratosis: Secondary | ICD-10-CM | POA: Diagnosis not present

## 2024-09-18 DIAGNOSIS — L821 Other seborrheic keratosis: Secondary | ICD-10-CM

## 2024-09-18 DIAGNOSIS — L578 Other skin changes due to chronic exposure to nonionizing radiation: Secondary | ICD-10-CM

## 2024-09-18 DIAGNOSIS — L820 Inflamed seborrheic keratosis: Secondary | ICD-10-CM

## 2024-09-18 DIAGNOSIS — W908XXA Exposure to other nonionizing radiation, initial encounter: Secondary | ICD-10-CM | POA: Diagnosis not present

## 2024-09-18 DIAGNOSIS — Z7189 Other specified counseling: Secondary | ICD-10-CM

## 2024-09-18 NOTE — Progress Notes (Signed)
 Follow-Up Visit   Subjective  Alyssa Nolan is a 83 y.o. female who presents for the following: AKs at left medial brow and nose. Here for treatment. ISKs on face and legs, here for treatment. Deferred at last visit due to upcoming trip.   The patient has spots, moles and lesions to be evaluated, some may be new or changing and the patient may have concern these could be cancer.    The following portions of the chart were reviewed this encounter and updated as appropriate: medications, allergies, medical history  Review of Systems:  No other skin or systemic complaints except as noted in HPI or Assessment and Plan.  Objective  Well appearing patient in no apparent distress; mood and affect are within normal limits.  A focused examination was performed of the following areas: Face and legs   Relevant physical exam findings are noted in the Assessment and Plan.  L medial brow x1, L nose x3 (4) Erythematous thin papules/macules with gritty scale.  R chest x1, R leg x17, R upper arm x2, R ant neck x1, L ant neck x3 (24) Erythematous keratotic or waxy stuck-on papule or plaque.  Assessment & Plan   SEBORRHEIC KERATOSIS - Stuck-on, waxy, tan-brown papules and/or plaques  - Benign-appearing - Discussed benign etiology and prognosis. - Observe - Call for any changes  ACTINIC DAMAGE - chronic, secondary to cumulative UV radiation exposure/sun exposure over time - diffuse scaly erythematous macules with underlying dyspigmentation - Recommend daily broad spectrum sunscreen SPF 30+ to sun-exposed areas, reapply every 2 hours as needed.  - Recommend staying in the shade or wearing long sleeves, sun glasses (UVA+UVB protection) and wide brim hats (4-inch brim around the entire circumference of the hat). - Call for new or changing lesions.   AK (ACTINIC KERATOSIS) (4) L medial brow x1, L nose x3 (4) Actinic keratoses are precancerous spots that appear secondary to cumulative UV  radiation exposure/sun exposure over time. They are chronic with expected duration over 1 year. A portion of actinic keratoses will progress to squamous cell carcinoma of the skin. It is not possible to reliably predict which spots will progress to skin cancer and so treatment is recommended to prevent development of skin cancer.  Recommend daily broad spectrum sunscreen SPF 30+ to sun-exposed areas, reapply every 2 hours as needed.  Recommend staying in the shade or wearing long sleeves, sun glasses (UVA+UVB protection) and wide brim hats (4-inch brim around the entire circumference of the hat). Call for new or changing lesions. Destruction of lesion - L medial brow x1, L nose x3 (4) Complexity: simple   Destruction method: cryotherapy   Informed consent: discussed and consent obtained   Timeout:  patient name, date of birth, surgical site, and procedure verified Lesion destroyed using liquid nitrogen: Yes   Region frozen until ice ball extended beyond lesion: Yes   Outcome: patient tolerated procedure well with no complications   Post-procedure details: wound care instructions given   Additional details:  Prior to procedure, discussed risks of blister formation, small wound, skin dyspigmentation, or rare scar following cryotherapy. Recommend Vaseline ointment to treated areas while healing.   INFLAMED SEBORRHEIC KERATOSIS (24) R chest x1, R leg x17, R upper arm x2, R ant neck x1, L ant neck x3 (24) Symptomatic, irritating, patient would like treated. Destruction of lesion - R chest x1, R leg x17, R upper arm x2, R ant neck x1, L ant neck x3 (24) Complexity: simple   Destruction  method: cryotherapy   Informed consent: discussed and consent obtained   Timeout:  patient name, date of birth, surgical site, and procedure verified Lesion destroyed using liquid nitrogen: Yes   Region frozen until ice ball extended beyond lesion: Yes   Outcome: patient tolerated procedure well with no  complications   Post-procedure details: wound care instructions given   Additional details:  Prior to procedure, discussed risks of blister formation, small wound, skin dyspigmentation, or rare scar following cryotherapy. Recommend Vaseline ointment to treated areas while healing.     Return in about 4 months (around 01/16/2025) for AK Follow Up.  I, Zaydee Aina, CMA, am acting as scribe for Alm Rhyme, MD.   Documentation: I have reviewed the above documentation for accuracy and completeness, and I agree with the above.  Alm Rhyme, MD

## 2024-09-18 NOTE — Patient Instructions (Signed)
 Cryotherapy Aftercare  Wash gently with soap and water everyday.   Apply Vaseline Jelly daily until healed.     Recommend daily broad spectrum sunscreen SPF 30+ to sun-exposed areas, reapply every 2 hours as needed. Call for new or changing lesions.  Staying in the shade or wearing long sleeves, sun glasses (UVA+UVB protection) and wide brim hats (4-inch brim around the entire circumference of the hat) are also recommended for sun protection.      Due to recent changes in healthcare laws, you may see results of your pathology and/or laboratory studies on MyChart before the doctors have had a chance to review them. We understand that in some cases there may be results that are confusing or concerning to you. Please understand that not all results are received at the same time and often the doctors may need to interpret multiple results in order to provide you with the best plan of care or course of treatment. Therefore, we ask that you please give us  2 business days to thoroughly review all your results before contacting the office for clarification. Should we see a critical lab result, you will be contacted sooner.   If You Need Anything After Your Visit  If you have any questions or concerns for your doctor, please call our main line at [phone] and press option 4 to reach your doctor's medical assistant. If no one answers, please leave a voicemail as directed and we will return your call as soon as possible. Messages left after 4 pm will be answered the following business day.   You may also send us  a message via MyChart. We typically respond to MyChart messages within 1-2 business days.  For prescription refills, please ask your pharmacy to contact our office. Our fax number is [phone].  If you have an urgent issue when the clinic is closed that cannot wait until the next business day, you can page your doctor at the number below.    Please note that while we do our best to be  available for urgent issues outside of office hours, we are not available 24/7.   If you have an urgent issue and are unable to reach us , you may choose to seek medical care at your doctor's office, retail clinic, urgent care center, or emergency room.  If you have a medical emergency, please immediately call 911 or go to the emergency department.  Pager Numbers  - Dr. Hester: [phone]  - Dr. Jackquline: [phone]  - Dr. Claudene: [phone]   - Dr. Raymund: [phone]  In the event of inclement weather, please call our main line at [phone] for an update on the status of any delays or closures.  Dermatology Medication Tips: Please keep the boxes that topical medications come in in order to help keep track of the instructions about where and how to use these. Pharmacies typically print the medication instructions only on the boxes and not directly on the medication tubes.   If your medication is too expensive, please contact our office at [phone] option 4 or send us  a message through MyChart.   We are unable to tell what your co-pay for medications will be in advance as this is different depending on your insurance coverage. However, we may be able to find a substitute medication at lower cost or fill out paperwork to get insurance to cover a needed medication.   If a prior authorization is required to get your medication covered by your insurance company, please allow us   1-2 business days to complete this process.  Drug prices often vary depending on where the prescription is filled and some pharmacies may offer cheaper prices.  The website www.goodrx.com contains coupons for medications through different pharmacies. The prices here do not account for what the cost may be with help from insurance (it may be cheaper with your insurance), but the website can give you the price if you did not use any insurance.  - You can print the associated coupon and take it with your  prescription to the pharmacy.  - You may also stop by our office during regular business hours and pick up a GoodRx coupon card.  - If you need your prescription sent electronically to a different pharmacy, notify our office through [facility] or by phone at [phone] option 4.     Si Usted Necesita Algo Despus de Su Visita  Tambin puede enviarnos un mensaje a travs de Clinical cytogeneticist. Por lo general respondemos a los mensajes de MyChart en el transcurso de 1 a 2 das hbiles.  Para renovar recetas, por favor pida a su farmacia que se ponga en contacto con nuestra oficina. Randi lakes de fax es [place] [phone].  Si tiene un asunto urgente cuando la clnica est cerrada y que no puede esperar hasta el siguiente da hbil, puede llamar/localizar a su doctor(a) al nmero que aparece a continuacin.   Por favor, tenga en cuenta que aunque hacemos todo lo posible para estar disponibles para asuntos urgentes fuera del horario de [place], no estamos disponibles las 24 horas del da, los 7 [address]  [address] de la [place].   Si tiene un problema urgente y no puede comunicarse con nosotros, puede optar por buscar atencin mdica  en el consultorio de su doctor(a), en una clnica privada, en un centro de atencin urgente o en una sala de emergencias.  Si tiene Engineer, drilling, por favor llame inmediatamente al 911 o vaya a la sala de emergencias.  Nmeros de bper  - Dr. Hester: [phone]  - Dra. Jackquline: [phone]  - Dr. Claudene: [phone]  - Dra. Kitts: [phone]  En caso de inclemencias del [place], por favor llame a nuestra lnea principal al [phone] para una actualizacin sobre el estado de cualquier retraso o cierre.  Consejos para la medicacin en dermatologa: Por favor, guarde las cajas en las que vienen los medicamentos de uso tpico para ayudarle a seguir las instrucciones sobre dnde y cmo usarlos. Las farmacias generalmente imprimen las instrucciones del  medicamento slo en las cajas y no directamente en los tubos del [place].   Si su medicamento es muy caro, por favor, pngase en contacto con landry rieger llamando al [phone] y presione la opcin 4 o envenos un mensaje a travs de Clinical cytogeneticist.   No podemos decirle cul ser su copago por los medicamentos por adelantado ya que esto es diferente dependiendo de la cobertura de su seguro. Sin embargo, es posible que podamos encontrar un medicamento sustituto a Audiological scientist un formulario para que el seguro cubra el medicamento que se considera necesario.   Si se requiere una autorizacin previa para que su compaa de seguros malta su medicamento, por favor permtanos de 1 a 2 das hbiles para completar este proceso.  Los precios de los medicamentos varan con frecuencia dependiendo del Environmental consultant de dnde se surte la receta y alguna farmacias pueden ofrecer precios ms baratos.  El sitio web www.goodrx.com tiene cupones para medicamentos de Health and safety inspector. Los precios aqu no tienen en  cuenta lo que podra costar con la ayuda del seguro (puede ser ms barato con su seguro), pero el sitio web puede darle el precio si no Visual merchandiser.  - Puede imprimir el cupn correspondiente y llevarlo con su receta a la farmacia.  - Tambin puede pasar por nuestra oficina durante el horario de atencin regular y Education officer, museum una tarjeta de cupones de GoodRx.  - Si necesita que su receta se enve electrnicamente a una farmacia diferente, informe a nuestra oficina a travs de MyChart de [place] o por telfono llamando al [phone] y presione la opcin 4.

## 2024-09-20 ENCOUNTER — Other Ambulatory Visit: Payer: Self-pay | Admitting: Family Medicine

## 2024-09-24 ENCOUNTER — Encounter: Payer: Self-pay | Admitting: Dermatology

## 2024-09-26 ENCOUNTER — Other Ambulatory Visit (HOSPITAL_COMMUNITY): Payer: Self-pay

## 2024-09-26 ENCOUNTER — Other Ambulatory Visit: Payer: Self-pay | Admitting: Family Medicine

## 2024-09-26 ENCOUNTER — Telehealth: Payer: Self-pay

## 2024-09-26 ENCOUNTER — Encounter: Payer: Self-pay | Admitting: Family Medicine

## 2024-09-26 MED ORDER — CARISOPRODOL 350 MG PO TABS: 350.0000 mg | ORAL_TABLET | Freq: Every day | ORAL | 1 refills | Status: AC

## 2024-09-26 NOTE — Telephone Encounter (Signed)
 Pharmacy Patient Advocate Encounter  Received notification from [facility] that Prior Authorization for Carisoprodol  350 has been APPROVED from 09/26/24 to 12/25/24. Ran test claim, Copay is $4.66. This test claim was processed through [facility]- copay amounts may vary at other pharmacies due to pharmacy/plan contracts, or as the patient moves through the different stages of their insurance plan.   PA #/Case ID/Reference #: # [id number]

## 2024-09-26 NOTE — Telephone Encounter (Signed)
 Pharmacy Patient Advocate Encounter   Received notification from Pt Calls Messages that prior authorization for Carisoprodol  350 is required/requested.   Insurance verification completed.   The patient is insured through [facility]   Per test claim: PA required; PA submitted to above mentioned insurance via Latent Key/confirmation #/EOC [id number] Status is pending

## 2024-10-13 ENCOUNTER — Other Ambulatory Visit: Payer: Self-pay | Admitting: Family Medicine

## 2024-10-13 DIAGNOSIS — M810 Age-related osteoporosis without current pathological fracture: Secondary | ICD-10-CM

## 2024-11-07 ENCOUNTER — Other Ambulatory Visit: Payer: Self-pay | Admitting: Gastroenterology

## 2024-12-20 ENCOUNTER — Other Ambulatory Visit: Payer: Self-pay | Admitting: Family Medicine

## 2025-01-22 ENCOUNTER — Ambulatory Visit: Admitting: Dermatology

## 2025-02-11 ENCOUNTER — Ambulatory Visit

## 2025-02-12 ENCOUNTER — Ambulatory Visit
